# Patient Record
Sex: Female | Born: 1960 | Race: Black or African American | Hispanic: No | Marital: Married | Smoking: Never smoker
Health system: Southern US, Community
[De-identification: ages and names within clinical notes are randomized; demographics above are authoritative.]

## PROBLEM LIST (undated history)

## (undated) DIAGNOSIS — R0602 Shortness of breath: Secondary | ICD-10-CM

## (undated) DIAGNOSIS — K3189 Other diseases of stomach and duodenum: Secondary | ICD-10-CM

## (undated) DIAGNOSIS — M6740 Ganglion, unspecified site: Secondary | ICD-10-CM

## (undated) DIAGNOSIS — D649 Anemia, unspecified: Secondary | ICD-10-CM

## (undated) DIAGNOSIS — R1013 Epigastric pain: Secondary | ICD-10-CM

## (undated) DIAGNOSIS — I10 Essential (primary) hypertension: Secondary | ICD-10-CM

## (undated) DIAGNOSIS — K219 Gastro-esophageal reflux disease without esophagitis: Secondary | ICD-10-CM

## (undated) DIAGNOSIS — I5030 Unspecified diastolic (congestive) heart failure: Secondary | ICD-10-CM

## (undated) DIAGNOSIS — R002 Palpitations: Secondary | ICD-10-CM

## (undated) DIAGNOSIS — I509 Heart failure, unspecified: Secondary | ICD-10-CM

## (undated) DIAGNOSIS — M549 Dorsalgia, unspecified: Secondary | ICD-10-CM

## (undated) DIAGNOSIS — R55 Syncope and collapse: Secondary | ICD-10-CM

## (undated) DIAGNOSIS — M2550 Pain in unspecified joint: Secondary | ICD-10-CM

## (undated) DIAGNOSIS — R931 Abnormal findings on diagnostic imaging of heart and coronary circulation: Secondary | ICD-10-CM

## (undated) DIAGNOSIS — E559 Vitamin D deficiency, unspecified: Secondary | ICD-10-CM

## (undated) DIAGNOSIS — I421 Obstructive hypertrophic cardiomyopathy: Secondary | ICD-10-CM

## (undated) DIAGNOSIS — R011 Cardiac murmur, unspecified: Secondary | ICD-10-CM

## (undated) DIAGNOSIS — E785 Hyperlipidemia, unspecified: Secondary | ICD-10-CM

## (undated) DIAGNOSIS — G5690 Unspecified mononeuropathy of unspecified upper limb: Secondary | ICD-10-CM

## (undated) HISTORY — DX: Unspecified diastolic (congestive) heart failure: I50.30

## (undated) HISTORY — DX: Dorsalgia, unspecified: M54.9

## (undated) HISTORY — DX: Syncope and collapse: R55

## (undated) HISTORY — DX: Unspecified mononeuropathy of unspecified upper limb: G56.90

## (undated) HISTORY — DX: Essential (primary) hypertension: I10

## (undated) HISTORY — DX: Gastro-esophageal reflux disease without esophagitis: K21.9

## (undated) HISTORY — DX: Abnormal findings on diagnostic imaging of heart and coronary circulation: R93.1

## (undated) HISTORY — DX: Anemia, unspecified: D64.9

## (undated) HISTORY — PX: CARDIAC CATHETERIZATION: SHX172

## (undated) HISTORY — DX: Ganglion, unspecified site: M67.40

## (undated) HISTORY — DX: Shortness of breath: R06.02

## (undated) HISTORY — DX: Other diseases of stomach and duodenum: K31.89

## (undated) HISTORY — DX: Pain in unspecified joint: M25.50

## (undated) HISTORY — DX: Heart failure, unspecified: I50.9

## (undated) HISTORY — PX: TUBAL LIGATION: SHX77

## (undated) HISTORY — DX: Obstructive hypertrophic cardiomyopathy: I42.1

## (undated) HISTORY — DX: Palpitations: R00.2

## (undated) HISTORY — DX: Cardiac murmur, unspecified: R01.1

## (undated) HISTORY — DX: Hyperlipidemia, unspecified: E78.5

## (undated) HISTORY — DX: Epigastric pain: R10.13

## (undated) HISTORY — DX: Vitamin D deficiency, unspecified: E55.9

---

## 1997-09-14 ENCOUNTER — Encounter: Admission: RE | Admit: 1997-09-14 | Discharge: 1997-09-14 | Payer: Self-pay | Admitting: Family Medicine

## 1997-11-16 ENCOUNTER — Ambulatory Visit (HOSPITAL_COMMUNITY): Admission: RE | Admit: 1997-11-16 | Discharge: 1997-11-16 | Payer: Self-pay | Admitting: Obstetrics and Gynecology

## 1998-01-04 ENCOUNTER — Encounter: Admission: RE | Admit: 1998-01-04 | Discharge: 1998-01-04 | Payer: Self-pay | Admitting: Family Medicine

## 1998-01-19 ENCOUNTER — Encounter: Admission: RE | Admit: 1998-01-19 | Discharge: 1998-01-19 | Payer: Self-pay | Admitting: Sports Medicine

## 2002-04-13 ENCOUNTER — Inpatient Hospital Stay (HOSPITAL_COMMUNITY): Admission: EM | Admit: 2002-04-13 | Discharge: 2002-04-15 | Payer: Self-pay | Admitting: *Deleted

## 2002-04-13 ENCOUNTER — Encounter: Payer: Self-pay | Admitting: Cardiology

## 2002-04-13 ENCOUNTER — Encounter: Payer: Self-pay | Admitting: *Deleted

## 2002-04-14 ENCOUNTER — Encounter: Payer: Self-pay | Admitting: Family Medicine

## 2002-04-14 ENCOUNTER — Encounter (INDEPENDENT_AMBULATORY_CARE_PROVIDER_SITE_OTHER): Payer: Self-pay | Admitting: Interventional Cardiology

## 2003-05-14 ENCOUNTER — Emergency Department (HOSPITAL_COMMUNITY): Admission: EM | Admit: 2003-05-14 | Discharge: 2003-05-14 | Payer: Self-pay | Admitting: Emergency Medicine

## 2003-07-01 ENCOUNTER — Emergency Department (HOSPITAL_COMMUNITY): Admission: EM | Admit: 2003-07-01 | Discharge: 2003-07-02 | Payer: Self-pay | Admitting: Emergency Medicine

## 2003-08-06 ENCOUNTER — Encounter: Admission: RE | Admit: 2003-08-06 | Discharge: 2003-08-06 | Payer: Self-pay | Admitting: Family Medicine

## 2004-03-14 ENCOUNTER — Emergency Department (HOSPITAL_COMMUNITY): Admission: EM | Admit: 2004-03-14 | Discharge: 2004-03-14 | Payer: Self-pay | Admitting: Family Medicine

## 2004-12-05 ENCOUNTER — Emergency Department (HOSPITAL_COMMUNITY): Admission: EM | Admit: 2004-12-05 | Discharge: 2004-12-06 | Payer: Self-pay | Admitting: Family Medicine

## 2004-12-19 ENCOUNTER — Ambulatory Visit: Payer: Self-pay | Admitting: Family Medicine

## 2005-01-04 ENCOUNTER — Ambulatory Visit: Payer: Self-pay | Admitting: Family Medicine

## 2005-05-02 ENCOUNTER — Observation Stay (HOSPITAL_COMMUNITY): Admission: AD | Admit: 2005-05-02 | Discharge: 2005-05-03 | Payer: Self-pay | Admitting: Family Medicine

## 2005-05-02 ENCOUNTER — Ambulatory Visit: Payer: Self-pay | Admitting: Sports Medicine

## 2005-05-02 ENCOUNTER — Ambulatory Visit: Payer: Self-pay | Admitting: Family Medicine

## 2005-11-12 ENCOUNTER — Emergency Department (HOSPITAL_COMMUNITY): Admission: EM | Admit: 2005-11-12 | Discharge: 2005-11-12 | Payer: Self-pay | Admitting: Emergency Medicine

## 2005-11-16 ENCOUNTER — Ambulatory Visit: Payer: Self-pay | Admitting: Sports Medicine

## 2006-05-01 ENCOUNTER — Encounter (INDEPENDENT_AMBULATORY_CARE_PROVIDER_SITE_OTHER): Payer: Self-pay | Admitting: *Deleted

## 2006-05-01 LAB — CONVERTED CEMR LAB

## 2006-05-23 ENCOUNTER — Encounter (INDEPENDENT_AMBULATORY_CARE_PROVIDER_SITE_OTHER): Payer: Self-pay | Admitting: *Deleted

## 2006-05-23 ENCOUNTER — Ambulatory Visit: Payer: Self-pay | Admitting: Family Medicine

## 2006-05-30 ENCOUNTER — Ambulatory Visit: Payer: Self-pay | Admitting: Family Medicine

## 2006-05-30 ENCOUNTER — Encounter (INDEPENDENT_AMBULATORY_CARE_PROVIDER_SITE_OTHER): Payer: Self-pay | Admitting: *Deleted

## 2006-05-30 LAB — CONVERTED CEMR LAB
ALT: 16 units/L (ref 0–35)
AST: 14 units/L (ref 0–37)
Chloride: 102 meq/L (ref 96–112)
HDL: 38 mg/dL — ABNORMAL LOW (ref >39)
LDL Cholesterol: 69 mg/dL (ref 0–99)
Sodium: 137 meq/L (ref 135–145)
TSH: 0.556 microintl units/mL (ref 0.350–5.50)
Total Bilirubin: 0.6 mg/dL (ref 0.3–1.2)
Total CHOL/HDL Ratio: 3.3
VLDL: 18 mg/dL (ref 0–40)

## 2006-06-21 DIAGNOSIS — N920 Excessive and frequent menstruation with regular cycle: Secondary | ICD-10-CM

## 2006-06-21 DIAGNOSIS — I10 Essential (primary) hypertension: Secondary | ICD-10-CM | POA: Insufficient documentation

## 2006-06-21 DIAGNOSIS — IMO0002 Reserved for concepts with insufficient information to code with codable children: Secondary | ICD-10-CM

## 2006-06-21 DIAGNOSIS — F418 Other specified anxiety disorders: Secondary | ICD-10-CM | POA: Insufficient documentation

## 2006-06-21 DIAGNOSIS — F411 Generalized anxiety disorder: Secondary | ICD-10-CM

## 2006-06-21 HISTORY — DX: Generalized anxiety disorder: F41.1

## 2006-06-22 ENCOUNTER — Encounter (INDEPENDENT_AMBULATORY_CARE_PROVIDER_SITE_OTHER): Payer: Self-pay | Admitting: *Deleted

## 2006-08-15 ENCOUNTER — Encounter (INDEPENDENT_AMBULATORY_CARE_PROVIDER_SITE_OTHER): Payer: Self-pay | Admitting: *Deleted

## 2006-08-21 ENCOUNTER — Emergency Department (HOSPITAL_COMMUNITY): Admission: EM | Admit: 2006-08-21 | Discharge: 2006-08-21 | Payer: Self-pay | Admitting: Emergency Medicine

## 2006-08-24 ENCOUNTER — Encounter (INDEPENDENT_AMBULATORY_CARE_PROVIDER_SITE_OTHER): Payer: Self-pay | Admitting: *Deleted

## 2006-09-20 ENCOUNTER — Encounter (INDEPENDENT_AMBULATORY_CARE_PROVIDER_SITE_OTHER): Payer: Self-pay | Admitting: *Deleted

## 2006-12-07 ENCOUNTER — Telehealth: Payer: Self-pay | Admitting: *Deleted

## 2007-01-01 ENCOUNTER — Emergency Department (HOSPITAL_COMMUNITY): Admission: EM | Admit: 2007-01-01 | Discharge: 2007-01-01 | Payer: Self-pay | Admitting: Family Medicine

## 2007-01-17 ENCOUNTER — Ambulatory Visit: Payer: Self-pay | Admitting: Sports Medicine

## 2007-02-08 ENCOUNTER — Telehealth (INDEPENDENT_AMBULATORY_CARE_PROVIDER_SITE_OTHER): Payer: Self-pay | Admitting: *Deleted

## 2007-02-08 ENCOUNTER — Ambulatory Visit: Payer: Self-pay | Admitting: Internal Medicine

## 2007-03-12 ENCOUNTER — Emergency Department (HOSPITAL_COMMUNITY): Admission: EM | Admit: 2007-03-12 | Discharge: 2007-03-12 | Payer: Self-pay | Admitting: Family Medicine

## 2007-05-21 ENCOUNTER — Emergency Department (HOSPITAL_COMMUNITY): Admission: EM | Admit: 2007-05-21 | Discharge: 2007-05-21 | Payer: Self-pay | Admitting: Emergency Medicine

## 2007-07-18 ENCOUNTER — Encounter: Admission: RE | Admit: 2007-07-18 | Discharge: 2007-07-18 | Payer: Self-pay | Admitting: Gastroenterology

## 2007-11-08 ENCOUNTER — Inpatient Hospital Stay (HOSPITAL_COMMUNITY): Admission: EM | Admit: 2007-11-08 | Discharge: 2007-11-12 | Payer: Self-pay | Admitting: Emergency Medicine

## 2007-11-08 ENCOUNTER — Ambulatory Visit: Payer: Self-pay | Admitting: Family Medicine

## 2007-11-08 ENCOUNTER — Encounter: Payer: Self-pay | Admitting: Family Medicine

## 2007-11-08 LAB — CONVERTED CEMR LAB: HDL: 42 mg/dL

## 2007-11-10 ENCOUNTER — Encounter (INDEPENDENT_AMBULATORY_CARE_PROVIDER_SITE_OTHER): Payer: Self-pay | Admitting: Interventional Cardiology

## 2007-11-10 ENCOUNTER — Encounter: Payer: Self-pay | Admitting: Cardiology

## 2007-11-11 LAB — CONVERTED CEMR LAB: Creatinine, Ser: 0.93 mg/dL

## 2007-11-12 ENCOUNTER — Encounter: Payer: Self-pay | Admitting: Cardiology

## 2007-11-21 ENCOUNTER — Ambulatory Visit: Payer: Self-pay | Admitting: Family Medicine

## 2008-03-04 ENCOUNTER — Ambulatory Visit: Payer: Self-pay | Admitting: Family Medicine

## 2008-11-26 ENCOUNTER — Ambulatory Visit (HOSPITAL_COMMUNITY): Admission: RE | Admit: 2008-11-26 | Discharge: 2008-11-26 | Payer: Self-pay | Admitting: Family Medicine

## 2008-11-26 ENCOUNTER — Ambulatory Visit: Payer: Self-pay | Admitting: Family Medicine

## 2008-11-26 DIAGNOSIS — R51 Headache: Secondary | ICD-10-CM

## 2008-11-26 DIAGNOSIS — K047 Periapical abscess without sinus: Secondary | ICD-10-CM

## 2008-11-26 DIAGNOSIS — R519 Headache, unspecified: Secondary | ICD-10-CM | POA: Insufficient documentation

## 2008-11-27 ENCOUNTER — Telehealth (INDEPENDENT_AMBULATORY_CARE_PROVIDER_SITE_OTHER): Payer: Self-pay | Admitting: *Deleted

## 2008-12-15 ENCOUNTER — Ambulatory Visit: Payer: Self-pay | Admitting: Family Medicine

## 2008-12-15 ENCOUNTER — Encounter: Payer: Self-pay | Admitting: Family Medicine

## 2008-12-15 LAB — CONVERTED CEMR LAB
ALT: 17 units/L (ref 0–35)
AST: 17 units/L (ref 0–37)
Albumin: 4.2 g/dL (ref 3.5–5.2)
CO2: 22 meq/L (ref 19–32)
Creatinine, Ser: 0.97 mg/dL (ref 0.40–1.20)

## 2008-12-17 ENCOUNTER — Ambulatory Visit: Payer: Self-pay | Admitting: Family Medicine

## 2009-06-22 ENCOUNTER — Encounter: Admission: RE | Admit: 2009-06-22 | Discharge: 2009-06-22 | Payer: Self-pay | Admitting: Obstetrics and Gynecology

## 2009-07-01 ENCOUNTER — Encounter: Admission: RE | Admit: 2009-07-01 | Discharge: 2009-07-01 | Payer: Self-pay | Admitting: Obstetrics and Gynecology

## 2009-09-08 ENCOUNTER — Ambulatory Visit: Payer: Self-pay | Admitting: Family Medicine

## 2009-09-08 ENCOUNTER — Encounter: Payer: Self-pay | Admitting: Cardiology

## 2009-09-08 ENCOUNTER — Ambulatory Visit (HOSPITAL_COMMUNITY): Admission: RE | Admit: 2009-09-08 | Discharge: 2009-09-08 | Payer: Self-pay | Admitting: Family Medicine

## 2009-09-08 DIAGNOSIS — R52 Pain, unspecified: Secondary | ICD-10-CM | POA: Insufficient documentation

## 2009-09-08 DIAGNOSIS — R079 Chest pain, unspecified: Secondary | ICD-10-CM | POA: Insufficient documentation

## 2009-09-15 ENCOUNTER — Encounter: Payer: Self-pay | Admitting: Family Medicine

## 2009-09-15 ENCOUNTER — Telehealth: Payer: Self-pay | Admitting: *Deleted

## 2009-12-24 ENCOUNTER — Encounter: Payer: Self-pay | Admitting: Family Medicine

## 2009-12-24 ENCOUNTER — Ambulatory Visit: Payer: Self-pay | Admitting: Family Medicine

## 2009-12-24 DIAGNOSIS — R5383 Other fatigue: Secondary | ICD-10-CM | POA: Insufficient documentation

## 2009-12-24 DIAGNOSIS — R209 Unspecified disturbances of skin sensation: Secondary | ICD-10-CM | POA: Insufficient documentation

## 2009-12-24 DIAGNOSIS — R5381 Other malaise: Secondary | ICD-10-CM

## 2009-12-28 ENCOUNTER — Encounter: Payer: Self-pay | Admitting: Family Medicine

## 2009-12-28 DIAGNOSIS — E559 Vitamin D deficiency, unspecified: Secondary | ICD-10-CM

## 2009-12-28 LAB — CONVERTED CEMR LAB
ALT: 15 units/L (ref 0–35)
Chloride: 104 meq/L (ref 96–112)
Cholesterol: 160 mg/dL (ref 0–200)
Creatinine, Ser: 1.04 mg/dL (ref 0.40–1.20)
HCT: 41.1 % (ref 36.0–46.0)
Hemoglobin: 13.4 g/dL (ref 12.0–15.0)
MCV: 82.7 fL (ref 78.0–100.0)
Potassium: 4.4 meq/L (ref 3.5–5.3)
RBC: 4.97 M/uL (ref 3.87–5.11)
RDW: 15.2 % (ref 11.5–15.5)
TSH: 0.502 microintl units/mL (ref 0.350–4.500)
Total Bilirubin: 0.5 mg/dL (ref 0.3–1.2)
Total CHOL/HDL Ratio: 3.9
VLDL: 25 mg/dL (ref 0–40)
Vitamin B-12: 928 pg/mL — ABNORMAL HIGH (ref 211–911)

## 2010-01-28 ENCOUNTER — Ambulatory Visit: Payer: Self-pay | Admitting: Cardiology

## 2010-01-28 DIAGNOSIS — R55 Syncope and collapse: Secondary | ICD-10-CM

## 2010-01-28 DIAGNOSIS — I421 Obstructive hypertrophic cardiomyopathy: Secondary | ICD-10-CM | POA: Insufficient documentation

## 2010-01-28 HISTORY — DX: Syncope and collapse: R55

## 2010-02-16 ENCOUNTER — Ambulatory Visit: Payer: Self-pay | Admitting: Cardiovascular Disease

## 2010-02-16 ENCOUNTER — Ambulatory Visit: Payer: Self-pay | Admitting: Cardiology

## 2010-02-16 ENCOUNTER — Ambulatory Visit: Payer: Self-pay | Admitting: Internal Medicine

## 2010-02-16 ENCOUNTER — Ambulatory Visit: Payer: Self-pay

## 2010-02-16 ENCOUNTER — Encounter: Payer: Self-pay | Admitting: Cardiology

## 2010-02-16 ENCOUNTER — Ambulatory Visit (HOSPITAL_COMMUNITY): Admission: RE | Admit: 2010-02-16 | Discharge: 2010-02-16 | Payer: Self-pay | Admitting: Cardiology

## 2010-03-08 ENCOUNTER — Ambulatory Visit: Payer: Self-pay | Admitting: Cardiology

## 2010-05-24 NOTE — Letter (Signed)
Summary: NM Myo Perfus Eject Fract   NM Myo Perfus Eject Fract   Imported By: Marylou Mccoy 02/11/2010 08:15:57  _____________________________________________________________________  External Attachment:    Type:   Image     Comment:   External Document

## 2010-05-24 NOTE — Letter (Signed)
Summary: Cardiac Cath  Cardiac Cath   Imported By: Marylou Mccoy 02/11/2010 08:24:28  _____________________________________________________________________  External Attachment:    Type:   Image     Comment:   External Document

## 2010-05-24 NOTE — Assessment & Plan Note (Signed)
Summary: chest pain,tcb   Vital Signs:  Patient profile:   50 year old female Weight:      184 pounds Temp:     98.4 degrees F oral Pulse rate:   77 / minute BP sitting:   121 / 81  (left arm) Cuff size:   large  Vitals Entered By: Jimmy Footman, CMA (December 24, 2009 9:59 AM) CC: joint pain and shoulder pain (chronic) Is Patient Diabetic? No Pain Assessment Patient in pain? yes     Location: joint Intensity: 10 Type: aching Comments Chest pain on and off Numbness in left left legs, hands, and lips    Primary Care Provider:  Delbert Harness MD  CC:  joint pain and shoulder pain (chronic).  History of Present Illness: 50 yo  Chronic intermittant chest pain:  Had a severe episode of chest pain on Aug 17th which she almost went to the ER for,  Was midsternal described as "spasms" non radiating and has been using aspirin frequently for this pain whic releiives it.  She connect this with her menstrual cycle but it also happens off, less severely. Occurs 1-2 per week lasting 15-20 minutes.  Usually occurs in evening.  Can be at rest or "sometime after I exercise" but no pain while exercising.  Has been having this pattern of chest pain for years now.  Hx of normal cath in 2009.    Patient is planning on having hysterectomy for heavy menstrual bleeding.  Lips and cheek, face tingling, numbness.   No swelling, erythema.    Habits & Providers  Alcohol-Tobacco-Diet     Tobacco Status: never  Current Medications (verified): 1)  Lisinopril-Hydrochlorothiazide 20-25 Mg  Tabs (Lisinopril-Hydrochlorothiazide) .... One Tab Daily. 2)  Aspirin 81 Mg  Tbec (Aspirin) .... One By Mouth Every Day  Allergies: 1)  Codeine Phosphate (Codeine Phosphate)  Past History:  Past Medical History: Left leg mole HTN Atypical Chest pain with Neg Cardiac Cath in July 2009 ECHO shows apical hypertrophy PMH-FH-SH reviewed-no changes except otherwise noted  Review of Systems      See  HPI  Physical Exam  General:  Well-developed,well-nourished,in no acute distress; alert,appropriate and cooperative throughout examination Neck:  supple, full ROM, and no masses.  No JVD Lungs:  Normal respiratory effort, chest expands symmetrically. Lungs are clear to auscultation, no crackles or wheezes. Heart:  Normal rate and regular rhythm. S1 and S2 normal without gallop, murmur, click, rub or other extra sounds. Extremities:  no LE edema Psych:  Oriented X3 and memory intact for recent and remote.     Impression & Recommendations:  Problem # 1:  CHEST PAIN (ICD-786.50)  Patient with continued recurrent chest pain thought to be non-cardiac after 2009 admission for chest pain. Non-obstructive cath, sever apical hypertophy. Today does not seem muskuloskeletal, GI related.  Patient denies anxiety.  Given patient continued concern, planning surgery, will refer to Cards for evaluation and consultation with patient.    Orders: [facility]- Est Level  3 (40102) Cardiology Referral (Cardiology)  Problem # 2:  PARESTHESIA (ICD-782.0) Findings not consistant with neuropathy or neurological districbution.  WIll check labs.  Follow up at next visit. Orders: Vit D, 25 [facility] [id number]) B12-[facility] [phone]) [facility]- Est Level  3 ([id number])  Complete Medication List: 1)  Lisinopril-hydrochlorothiazide 20-25 Mg Tabs (Lisinopril-hydrochlorothiazide) .... One tab daily. 2)  Aspirin 81 Mg Tbec (Aspirin) .... One by mouth every day  Other Orders: Comp Met-[facility] [phone]) Lipid-[facility] [phone]) CBC-[facility] ([id number]) TSH-[facility] [phone])  Patient Instructions:  1)  Will set up cardiology appt for you- let us know if you do not hear in the next week. 2)  Will wend you a letter if your labwork is normal, otherwise will call. 3)  Follow-up in 3-4 months or sooner if needed.

## 2010-05-24 NOTE — Letter (Signed)
Summary: Results Follow-up Letter  [facility]  [address]   [place], [place] [address]   Phone: [phone]  Fax: [phone]    12/28/2009  [address] [place], [place]  [address]  Dear Ms. Abood,   The following are the results of your recent test(s):  Your bloodwork all looked normal except for a low Vitamin D level.  This is not dangerous, but we will work on getting this up to a normal level.   I have sent in a prescriptino to your [facility].  We can recheck your level after you have completed your prescription.  Please let us know if you have further questions.  Sincerely,  Delbert Harness MD [facility]

## 2010-05-24 NOTE — Assessment & Plan Note (Signed)
Summary: per check out/sf   Primary Provider:  Delbert Harness MD   History of Present Illness: 50 year old female I saw in Oct 2011 for evaluation of chest pain. Apparently had a normal cardiac catheterization in 2009 other than apical hypertrophy. One I saw her previously we scheduled an echocardiogram which was performed in October of 2011. This revealed normal LV function and apical hypertrophy. There was moderate left atrial enlargement and mild mitral regurgitation. A Holter monitor showed no nonsustained ventricular tachycardia. An exercise treadmill revealed no decrease in systolic blood pressure with exercise. Since I last saw her, she has mild dyspnea on exertion relieved with rest. There is no orthopnea, PND, pedal edema, palpitations, syncope. She occasionally feels a brief twinge of pain under her left breast but no exertional chest pain.  Current Medications (verified): 1)  Lisinopril-Hydrochlorothiazide 20-25 Mg  Tabs (Lisinopril-Hydrochlorothiazide) .... One Tab Daily. 2)  Aspirin 81 Mg  Tbec (Aspirin) .... One By Mouth Every Day 3)  Metoprolol Succinate 25 Mg Xr24h-Tab (Metoprolol Succinate) .... Take One Tablet By Mouth Daily  Allergies (verified): 1)  Codeine Phosphate (Codeine Phosphate)  Past History:  Past Medical History: HTN hypertrophic cardiomyopathy (apical hypertrophy)  Past Surgical History: Reviewed history from 01/28/2010 and no changes required. Cervical conization (sev. Dysplasia) - 04/24/1997 Tubal ligation - 04/25/1987  Social History: Reviewed history from 12/24/2009 and no changes required. Married (1st husband died from massive MI), has 5 children(+ 4 from husband's prior marriage), works as Librarian, academic; Exercises routinely at [employer]; Denies tobacco or EtOH  Review of Systems       no fevers or chills, productive cough, hemoptysis, dysphasia, odynophagia, melena, hematochezia, dysuria, hematuria, rash, seizure activity, orthopnea, PND, pedal edema,  claudication. Remaining systems are negative.   Vital Signs:  Patient profile:   50 year old female Weight:      186 pounds Pulse rate:   60 / minute Pulse rhythm:   regular BP sitting:   108 / 60  (left arm) Cuff size:   regular  Vitals Entered By: Deliah Goody, RN (March 08, 2010 12:03 PM)  Physical Exam  General:  Well-developed well-nourished in no acute distress.  Skin is warm and dry.  HEENT is normal.  Neck is supple. No thyromegaly.  Chest is clear to auscultation with normal expansion.  Cardiovascular exam is regular rate and rhythm.  Abdominal exam nontender or distended. No masses palpated. Extremities show no edema. neuro grossly intact    Impression & Recommendations:  Problem # 1:  HOCM (ICD-425.1) Patient has apical hypertrophic cardiomyopathy. She has no risk factors for sudden cardiac death. We will continue with her beta blocker. I have instructed her that her children and siblings need to be screened. Her updated medication list for this problem includes:    Lisinopril-hydrochlorothiazide 20-25 Mg Tabs (Lisinopril-hydrochlorothiazide) ..... One tab daily.    Aspirin 81 Mg Tbec (Aspirin) ..... One by mouth every day    Metoprolol Succinate 25 Mg Xr24h-tab (Metoprolol succinate) .Marland Kitchen... Take one tablet by mouth daily  Problem # 2:  CHEST PAIN (ICD-786.50) Appears to be a chronic issue. No further workup at this time. Her updated medication list for this problem includes:    Lisinopril-hydrochlorothiazide 20-25 Mg Tabs (Lisinopril-hydrochlorothiazide) ..... One tab daily.    Aspirin 81 Mg Tbec (Aspirin) ..... One by mouth every day    Metoprolol Succinate 25 Mg Xr24h-tab (Metoprolol succinate) .Marland Kitchen... Take one tablet by mouth daily  Problem # 3:  HYPERTENSION, BENIGN SYSTEMIC (ICD-401.1) Blood pressure  controlled. Continue present medications. Her updated medication list for this problem includes:    Lisinopril-hydrochlorothiazide 20-25 Mg Tabs  (Lisinopril-hydrochlorothiazide) ..... One tab daily.    Aspirin 81 Mg Tbec (Aspirin) ..... One by mouth every day    Metoprolol Succinate 25 Mg Xr24h-tab (Metoprolol succinate) .Marland Kitchen... Take one tablet by mouth daily  Her updated medication list for this problem includes:    Lisinopril-hydrochlorothiazide 20-25 Mg Tabs (Lisinopril-hydrochlorothiazide) ..... One tab daily.    Aspirin 81 Mg Tbec (Aspirin) ..... One by mouth every day    Metoprolol Succinate 25 Mg Xr24h-tab (Metoprolol succinate) .Marland Kitchen... Take one tablet by mouth daily  Patient Instructions: 1)  Your physician wants you to follow-up in: 6 MONTHS  You will receive a reminder letter in the mail two months in advance. If you don't receive a letter, please call our office to schedule the follow-up appointment.

## 2010-05-24 NOTE — Assessment & Plan Note (Signed)
Summary: np6/chronic intermt. chest pain/lkg   Primary Provider:  Delbert Harness MD  CC:  chest pain.  History of Present Illness: 50 year old female for evaluation of chest pain. Apparently had a normal cardiac catheterization in 2009 other than apical hypertrophy. Echocardiogram in 2009 showed an ejection fraction of 75-80% with mild to moderate LVH and apical hypertrophy. 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 There is no significant valvular abnormality. The patient apparently has had intermittent chest pain since 2003. The pain is in the left chest area. It is described as a heaviness. It lasts for hours at a time. It increases during menstrual cycles. It can increase with lying flat. Is not exertional, pleuritic or related to food. There's no associated symptoms. She takes aspirin for the pain. She does have mild dyspnea on exertion but no orthopnea, PND, pedal edema. She has had 2 syncopal episodes in the past. Both occurred after lifting weights. She suddenly felt "hot", clammy and mild nausea. Her last was 2 years ago.  Current Medications (verified): 1)  Lisinopril-Hydrochlorothiazide 20-25 Mg  Tabs (Lisinopril-Hydrochlorothiazide) .... One Tab Daily. 2)  Aspirin 81 Mg  Tbec (Aspirin) .... One By Mouth Every Day 3)  Ergocalciferol 50000 Unit Caps (Ergocalciferol) .... Take One Tablet A Week For 8 Weeks  Allergies: 1)  Codeine Phosphate (Codeine Phosphate)  Past History:  Past Medical History: HTN Apical hypertrophy  Past Surgical History: Cervical conization (sev. Dysplasia) - 04/24/1997 Tubal ligation - 04/25/1987  Family History: Reviewed history from 12/24/2009 and no changes required. F- died from cirrhosis, M- healthy, no DM or CAD or breast CA No premature CAD  Social History: Reviewed history from 12/24/2009 and no changes required. 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 Remaining systems are negative.   Vital Signs:  Patient profile:   50 year old female Height:      65 inches Weight:      181 pounds BMI:     30.23 Pulse rate:   73 / minute Resp:     14 per minute BP sitting:   140 / 77  (left arm)  Vitals Entered By: Kem Parkinson (January 28, 2010 11:11 AM)  Physical Exam  General:  Well developed/well nourished in NAD Skin warm/dry Patient not depressed No peripheral clubbing Back-normal HEENT-normal/normal eyelids Neck supple/normal carotid upstroke bilaterally; no bruits; no JVD; no thyromegaly chest - CTA/ normal expansion CV - RRR/normal S1 and S2; no  rubs or gallops;  PMI nondisplaced; 2/6 systolic murmur left sternal border that increases with Valsalva. Abdomen -NT/ND, no HSM, no mass, + bowel sounds, no bruit 2+ femoral pulses, no bruits Ext-no edema, chords, 2+ DP Neuro-grossly nonfocal      EKG  Procedure date:  01/28/2010  Findings:      Sinus rhythm at a rate of 73. Axis normal. Diffuse T-wave inversion. Left atrial enlargement.  Impression & Recommendations:  Problem # 1:  CHEST PAIN (ICD-786.50) Symptoms atypical. No further ischemia evaluation warranted. Her updated medication list for this problem includes:    Lisinopril-hydrochlorothiazide 20-25 Mg Tabs (Lisinopril-hydrochlorothiazide) ..... One tab daily.    Aspirin 81 Mg Tbec (Aspirin) .Marland KitchenMarland KitchenMarland KitchenMarland Kitchen  One by mouth every day    Metoprolol Succinate 25 Mg Xr24h-tab (Metoprolol succinate) .Marland Kitchen... Take one tablet by mouth daily  Orders: Treadmill (Treadmill)  Problem # 2:  HOCM (ICD-425.1) Patient's electrocardiogram is consistent with hypertrophic cardiomyopathy.  Her previous echocardiogram and cardiac catheterization described apical hypertrophy. I will repeat her echocardiogram. I will schedule a 48 hour Holter monitor to exclude nonsustained ventricular tachycardia. I will schedule an exercise treadmill to make sure that her systolic blood pressure does not fall with exercise. She has no family history of sudden death. She did have syncope previously that sounds vagally mediated. I will Toprol 25 mg p.o. daily. I will see her back in 6 weeks. If hypertrophic cardiomyopathy is demonstrated on her echocardiogram then she will need her siblings and children screened. She may require EP evaluation in the future if significant risks for sudden death demonstrated on above studies. Her updated medication list for this problem includes:    Lisinopril-hydrochlorothiazide 20-25 Mg Tabs (Lisinopril-hydrochlorothiazide) ..... One tab daily.    Aspirin 81 Mg Tbec (Aspirin) ..... One by mouth every day    Metoprolol Succinate 25 Mg Xr24h-tab (Metoprolol succinate) .Marland Kitchen... Take one tablet by mouth daily  Problem # 3:  SYNCOPE (ICD-780.2) Previous episodes sound vaguely mediated. Her updated medication list for this problem includes:    Lisinopril-hydrochlorothiazide 20-25 Mg Tabs (Lisinopril-hydrochlorothiazide) ..... One tab daily.    Aspirin 81 Mg Tbec (Aspirin) ..... One by mouth every day    Metoprolol Succinate 25 Mg Xr24h-tab (Metoprolol succinate) .Marland Kitchen... Take one tablet by mouth daily  Orders: Echocardiogram (Echo) Holter (Holter)  Problem # 4:  HYPERTENSION, BENIGN SYSTEMIC (ICD-401.1) Add Toprol as described above. Her updated medication list for this problem includes:    Lisinopril-hydrochlorothiazide 20-25 Mg Tabs (Lisinopril-hydrochlorothiazide) ..... One tab daily.    Aspirin 81 Mg Tbec (Aspirin) ..... One by mouth every day    Metoprolol Succinate 25 Mg Xr24h-tab (Metoprolol succinate) .Marland Kitchen... Take one tablet by mouth daily  Patient Instructions: 1)   Your physician recommends that you schedule a follow-up appointment in: 6 WEEKS 2)  Your physician has recommended you make the following change in your medication: START METOPROLOL SUCC 25MG  ONCE DAILY 3)  Your physician has requested that you have an echocardiogram.  Echocardiography is a painless test that uses sound waves to create images of your heart. It provides your doctor with information about the size and shape of your heart and how well your heart's chambers and valves are working.  This procedure takes approximately one hour. There are no restrictions for this procedure. 4)  Your physician has recommended that you wear a holter monitor.  Holter monitors are medical devices that record the heart's electrical activity. Doctors most often use these monitors to diagnose arrhythmias. Arrhythmias are problems with the speed or rhythm of the heartbeat. The monitor is a small, portable device. You can wear one while you do your normal daily activities. This is usually used to diagnose what is causing palpitations/syncope (passing out). 48 HOUR 5)  Your physician has requested that you have an exercise tolerance test.  For further information please visit [email or web address]  Please also follow instruction sheet, as given. Prescriptions: METOPROLOL SUCCINATE 25 MG XR24H-TAB (METOPROLOL SUCCINATE) Take one tablet by mouth daily  #30 x 12   Entered by:   Deliah Goody, RN   Authorized by:   Ferman Hamming, MD, [facility]   Signed by:   Deliah Goody, RN on 01/28/2010   Method used:  [facility]ronically to        [phone][facility] (retail)       [address]       [place], [place]  [id number]       Ph: [phone] or [phone]       Fax: [phone]   RxID:   [id number]

## 2010-05-24 NOTE — Assessment & Plan Note (Signed)
Summary: not feeling well,df    Vital Signs:  Patient profile:   50 year old female Weight:      182 pounds Temp:     97.9 degrees F oral Pulse rate:   74 / minute BP sitting:   153 / 81  (left arm) Cuff size:   regular  Vitals Entered By: Tessie Fass CMA (Sep 08, 2009 10:32 AM) CC: not feeling well x 1 week Is Patient Diabetic? No Pain Assessment Patient in pain? no        Primary Care Provider:  Delbert Harness MD  CC:  not feeling well x 1 week.  History of Present Illness: 1.  not feeling well--past week.  under a lot of stress.  trouble sleeping.  back pain.  headache.  feeling fatigued. left leg pain at night.   left hand ocassionally goes numb.  one day had some chest pressure and shortness of breath that lasted for one minute and resolved spontaneously.  she was sitting at the time.  felt a little better when she stood up.    In 2009 was hospitalized for chest pain. had an abnormal echo with apical hypertrohphy.  had abnromal stress test and subsequently underwent a  cardiac cath, which was normal  all of these symptoms started a few days before her cycle.  has a history of physical and emotional symptoms around the time of her menses.  has a hx of anxiety as well.  had been on zoloft, but not currently taking.     Habits & Providers  Alcohol-Tobacco-Diet     Tobacco Status: never  Allergies: 1)  Codeine Phosphate (Codeine Phosphate)  Physical Exam  General:  Well-developed,well-nourished,in no acute distress; alert,appropriate and cooperative throughout examination Eyes:  normal appearance; eomi; perrl Lungs:  Normal respiratory effort, chest expands symmetrically. Lungs are clear to auscultation, no crackles or wheezes. Heart:  Normal rate and regular rhythm. S1 and S2 normal without gallop, murmur, click, rub or other extra sounds. Abdomen:  Bowel sounds positive,abdomen soft and non-tender without masses, organomegaly or hernias noted. Psych:  Oriented  X3, memory intact for recent and remote, normally interactive, good eye contact, not anxious appearing, and not depressed appearing.   Additional Exam:  vital signs reviewed    Impression & Recommendations:  Problem # 1:  Hx of CHEST PAIN (ICD-786.50) Assessment Unchanged EKG showed ST changes (inversion and depression in I and II, flattening in III, inversion in aVL, aVF, inversion and depression in V3-V6), but  EKG from the ER in July 2009 showed all of these changes except for the inversion in aVF.  Additionally, pain was very atypical and short-lived.   Orders: 12 Lead EKG (12 Lead EKG) [facility]- Est  Level 4 (45409)  Problem # 2:  GENERALIZED PAIN (ICD-780.96) Assessment: New  really she just has multiple somatic complaints.  pt tells me today that she thinks that much of this is likely due to anxiety.  perhaps there is a compenent of PMS as well.  her husband died of a massive MI at age 80; so this has contributed to her feeling anxious when she has physical symptoms.  Will follow up in 2 weeks.  if still with symptoms, check labs (TSH,CBC, CMET) and consider starting her back on an anxiety med if there is no better explanation for her complaints  Orders: [facility]- Est  Level 4 (81191)  Complete Medication List: 1)  Lisinopril-hydrochlorothiazide 20-25 Mg Tabs (Lisinopril-hydrochlorothiazide) .... One tab daily. 2)  Aspirin  81 Mg Tbec (Aspirin) .... One by mouth every day  Patient Instructions: 1)  It was nice to see you today. 2)  If you have crushing chest pain or shortness of breath, call 911. 3)  Please schedule a follow-up appointment in 2 weeks.

## 2010-05-24 NOTE — Progress Notes (Signed)
Summary: phn msg  Phone Note Call from Patient Call back at Work Phone [phone]   Caller: Patient Summary of Call: Would like to talk to Dr. Lafonda Mosses who she saw last about getting a referral to a cardilogist. Initial call taken by: Clydell Hakim,  Sep 15, 2009 8:46 AM  Follow-up for Phone Call        to PCP coverage Follow-up by: Gladstone Pih,  Sep 15, 2009 11:44 AM  Additional Follow-up for Phone Call Additional follow up Details #1::        I would like her to come in for a follow up appointment next week--in fact I advised her to schedule one at her last appt.  If we need to send her to cardiology at that time, we will.  thanks Additional Follow-up by: Asher Muir MD,  Sep 16, 2009 8:07 AM    Additional Follow-up for Phone Call Additional follow up Details #2::    pt will schedule an appt with pcp Follow-up by: Loralee Pacas CMA,  Sep 16, 2009 2:31 PM

## 2010-05-24 NOTE — Procedures (Signed)
Summary: summary report  summary report   Imported By: Mirna Mires 02/28/2010 08:46:53  _____________________________________________________________________  External Attachment:    Type:   Image     Comment:   External Document

## 2010-06-03 ENCOUNTER — Encounter: Payer: Self-pay | Admitting: *Deleted

## 2010-07-14 ENCOUNTER — Encounter: Payer: Self-pay | Admitting: Physician Assistant

## 2010-07-20 ENCOUNTER — Ambulatory Visit (INDEPENDENT_AMBULATORY_CARE_PROVIDER_SITE_OTHER): Payer: 59 | Admitting: Physician Assistant

## 2010-07-20 ENCOUNTER — Encounter: Payer: Self-pay | Admitting: Physician Assistant

## 2010-07-20 VITALS — BP 128/75 | HR 77 | Resp 12 | Ht 65.0 in | Wt 193.0 lb

## 2010-07-20 DIAGNOSIS — R079 Chest pain, unspecified: Secondary | ICD-10-CM

## 2010-07-20 DIAGNOSIS — I10 Essential (primary) hypertension: Secondary | ICD-10-CM

## 2010-07-20 DIAGNOSIS — R209 Unspecified disturbances of skin sensation: Secondary | ICD-10-CM

## 2010-07-20 DIAGNOSIS — I421 Obstructive hypertrophic cardiomyopathy: Secondary | ICD-10-CM

## 2010-07-20 DIAGNOSIS — R252 Cramp and spasm: Secondary | ICD-10-CM

## 2010-07-20 NOTE — Assessment & Plan Note (Signed)
Check cbc.  If macrocytic anemia, she can f/u with PCP for evaluation for B12 and folate.  I reassured her this was not from her medications.

## 2010-07-20 NOTE — Assessment & Plan Note (Signed)
Echo, ETT and holter all done within last 12 months.  Follow up with Dr. Jens Som in 3 months.

## 2010-07-20 NOTE — Patient Instructions (Signed)
Your physician recommends that you schedule a follow-up appointment in: 3 months with DR. CRENSHAW AS PER SCOTT WEAVER, PA-C  Your physician recommends that you return for lab work in: today bmet 401.1, 729.82, mag 401.1, 729.82, cbc 782.0

## 2010-07-20 NOTE — Assessment & Plan Note (Signed)
Chronic without change.  Normal cath in 2009.  I advised her that she can increase her activity as tolerated.

## 2010-07-20 NOTE — Progress Notes (Signed)
History of Present Illness: Primary Cardiologist: Dr. Olga Millers  [name] is a 50 y.o. female with a h/o normal cardiac catheterization in 2009 other than apical hypertrophy.  Dr. Jens Som had her do an echocardiogram which was performed in October of 2011. This revealed normal LV function and apical hypertrophy. There was moderate left atrial enlargement and mild mitral regurgitation. A Holter monitor showed no nonsustained ventricular tachycardia. An exercise treadmill revealed no decrease in systolic blood pressure with exercise.  She comes in today with questions about her medications.  She wants to know if they can cause tingling in her fingers.  This has been going on for a few months.  She thinks she took B12 at one point.  She also notes some leg cramps at night.  Feels like something crawling and has to move legs.  She has chest pain which is chronic without change.  She denies significant dyspnea.  No syncope.  No orthopnea or pnd.  No edema.  She wants to know if she can continue to increase exercise.  She notes some palps but these are unchanged.    Past Medical History  Diagnosis Date  . Chest pain     cath 7/09: normal cors; EF 65%  . HTN (hypertension)   . Hyperlipidemia   . HOCM (hypertrophic obstructive cardiomyopathy)     echo 10/11: normal LVF; apical hypertropy; mod LAE; mild MR;      Holter 10/11: no NSVT;     ETT 10/11: no decreased BP with exercise     Current Outpatient Prescriptions  Medication Sig Dispense Refill  . aspirin 81 MG EC tablet Take 81 mg by mouth daily.        Marland Kitchen lisinopril-hydrochlorothiazide (PRINZIDE,ZESTORETIC) 20-25 MG per tablet Take 1 tablet by mouth daily.        . metoprolol (TOPROL-XL) 25 MG 24 hr tablet Take 25 mg by mouth daily.          Allergies  Allergen Reactions  . Codeine Phosphate     REACTION: unspecified    Vital Signs: BP 128/75  Pulse 77  Resp 12  Ht 5\' 5"  (1.651 m)  Wt 193 lb (87.544 kg)  BMI 32.12  kg/m2  PHYSICAL EXAM: Well nourished, well developed, in no acute distress HEENT: normal Neck: no JVD Cardiac:  normal S1, S2; RRR; no murmur Lungs:  clear to auscultation bilaterally, no wheezing, rhonchi or rales Abd: soft, nontender, no hepatomegaly Ext: no edema Skin: warm and dry Neuro:  CNs 2-12 intact, no focal abnormalities noted  EKG:  NSR; HR 78; LVH with repolarization abnormality; no change since 01/2010  ASSESSMENT AND PLAN:

## 2010-07-20 NOTE — Assessment & Plan Note (Signed)
With ACE/diuretic, will check bmet and magnesium.  Otherwise, f/u with PCP.  May be RLS.  If hgb low, she will need iron eval.

## 2010-07-20 NOTE — Assessment & Plan Note (Signed)
Controlled.  Continue current therapy.  

## 2010-07-21 ENCOUNTER — Telehealth: Payer: Self-pay | Admitting: *Deleted

## 2010-07-21 LAB — CBC WITH DIFFERENTIAL/PLATELET
Basophils Absolute: 0.1 10*3/uL (ref 0.0–0.1)
Basophils Relative: 0.6 % (ref 0.0–3.0)
Eosinophils Absolute: 0.3 10*3/uL (ref 0.0–0.7)
Eosinophils Relative: 3.3 % (ref 0.0–5.0)
Hemoglobin: 12.6 g/dL (ref 12.0–15.0)
Lymphocytes Relative: 27.4 % (ref 12.0–46.0)
MCV: 82.4 fl (ref 78.0–100.0)
Neutro Abs: 5.8 10*3/uL (ref 1.4–7.7)
Neutrophils Relative %: 60.4 % (ref 43.0–77.0)
RDW: 14.9 % — ABNORMAL HIGH (ref 11.5–14.6)

## 2010-07-21 LAB — BASIC METABOLIC PANEL
Glucose, Bld: 85 mg/dL (ref 70–99)
Sodium: 139 mEq/L (ref 135–145)

## 2010-07-21 LAB — MAGNESIUM: Magnesium: 2 mg/dL (ref 1.5–2.5)

## 2010-07-21 NOTE — Telephone Encounter (Signed)
pt aware of lab results  

## 2010-07-21 NOTE — Telephone Encounter (Signed)
Message copied by Deliah Goody on Thu Jul 21, 2010  5:51 PM ------      Message from: Olga Millers      Created: Thu Jul 21, 2010 11:59 AM       ok

## 2010-07-21 NOTE — Telephone Encounter (Signed)
Message copied by Deliah Goody on Thu Jul 21, 2010  5:48 PM ------      Message from: Olga Millers      Created: Thu Jul 21, 2010 11:59 AM       ok

## 2010-07-28 ENCOUNTER — Other Ambulatory Visit: Payer: Self-pay | Admitting: Obstetrics and Gynecology

## 2010-07-28 DIAGNOSIS — Z1231 Encounter for screening mammogram for malignant neoplasm of breast: Secondary | ICD-10-CM

## 2010-08-03 ENCOUNTER — Ambulatory Visit
Admission: RE | Admit: 2010-08-03 | Discharge: 2010-08-03 | Disposition: A | Discharge status: Home / Self Care | Payer: 59 | Source: Ambulatory Visit | Attending: Obstetrics and Gynecology | Admitting: Obstetrics and Gynecology

## 2010-08-03 DIAGNOSIS — Z1231 Encounter for screening mammogram for malignant neoplasm of breast: Secondary | ICD-10-CM

## 2010-08-31 ENCOUNTER — Ambulatory Visit (INDEPENDENT_AMBULATORY_CARE_PROVIDER_SITE_OTHER): Payer: 59 | Admitting: Family Medicine

## 2010-08-31 ENCOUNTER — Encounter: Payer: Self-pay | Admitting: Family Medicine

## 2010-08-31 VITALS — BP 116/71 | HR 61 | Temp 98.2°F | Ht 65.0 in | Wt 189.2 lb

## 2010-08-31 DIAGNOSIS — E559 Vitamin D deficiency, unspecified: Secondary | ICD-10-CM

## 2010-08-31 DIAGNOSIS — R079 Chest pain, unspecified: Secondary | ICD-10-CM

## 2010-08-31 DIAGNOSIS — Z0000 Encounter for general adult medical examination without abnormal findings: Secondary | ICD-10-CM

## 2010-08-31 DIAGNOSIS — G5690 Unspecified mononeuropathy of unspecified upper limb: Secondary | ICD-10-CM

## 2010-08-31 DIAGNOSIS — F411 Generalized anxiety disorder: Secondary | ICD-10-CM

## 2010-08-31 DIAGNOSIS — G5600 Carpal tunnel syndrome, unspecified upper limb: Secondary | ICD-10-CM

## 2010-08-31 DIAGNOSIS — E669 Obesity, unspecified: Secondary | ICD-10-CM | POA: Insufficient documentation

## 2010-08-31 DIAGNOSIS — Z111 Encounter for screening for respiratory tuberculosis: Secondary | ICD-10-CM

## 2010-08-31 DIAGNOSIS — N920 Excessive and frequent menstruation with regular cycle: Secondary | ICD-10-CM

## 2010-08-31 HISTORY — DX: Unspecified mononeuropathy of unspecified upper limb: G56.90

## 2010-08-31 MED ORDER — TUBERCULIN PPD 5 UNIT/0.1ML ID SOLN
5.0000 [IU] | Freq: Once | INTRADERMAL | Status: AC
  Administered 2010-08-31: 5 [IU] via INTRADERMAL

## 2010-08-31 NOTE — Progress Notes (Signed)
  Subjective:    Patient ID: [name], female    DOB: 1960-08-24, 50 y.o.   MRN: [id number]  HPIHere for annual physical.  Brings form to fill out- plans to adopt some foster children. Has OB-gyn for gynecological care.  Weight loss:  Would like to work on losing weight, asks about tips on nutrition and exercise  Bilateral 5th digital numbness- in past few months, has has tingling in 5th digit bilaterally that wakes her up at night.  Also notices at times when on elliptical machine.  Review of Systems  Constitutional: Negative for fever, appetite change and unexpected weight change.  HENT: Negative for hearing loss and sore throat.   Eyes: Negative for visual disturbance.  Respiratory: Negative for cough and shortness of breath.   Cardiovascular: Negative for chest pain.  Gastrointestinal: Negative for nausea, vomiting, abdominal pain and constipation.  Genitourinary: Negative for dysuria.  Neurological: Positive for numbness. Negative for dizziness and weakness.  Psychiatric/Behavioral: Negative for dysphoric mood. The patient is not nervous/anxious.       Objective:   Physical Exam  Constitutional: She is oriented to person, place, and time. She appears well-developed and well-nourished.  HENT:  Head: Normocephalic and atraumatic.  Right Ear: External ear normal.  Left Ear: External ear normal.  Mouth/Throat: No oropharyngeal exudate.  Eyes: Conjunctivae are normal. Pupils are equal, round, and reactive to light.  Neck: Neck supple. No thyromegaly present.  Cardiovascular: Normal rate and regular rhythm.   No murmur heard. Pulmonary/Chest: Effort normal and breath sounds normal. No respiratory distress. She has no wheezes.  Abdominal: Soft. Bowel sounds are normal. She exhibits no distension and no mass. There is no tenderness. There is no rebound and no guarding.  Musculoskeletal: She exhibits no edema.       Neg tinels, can reproduce 5th digit tingling with flexion at  wrist. Normal strength and sensation to light touch.  Neurovascularly intact.  Neurological: She is alert and oriented to person, place, and time.  Psychiatric: She has a normal mood and affect. Her behavior is normal. Judgment and thought content normal.          Assessment & Plan:

## 2010-08-31 NOTE — Assessment & Plan Note (Signed)
Discussed combination of aerobic and weight-bearing exercise 30-45 minutes most days of the week.  She will check into her insurance coverage for medical nutrition therapy, advised to make appt with Dr. Gerilyn Pilgrim.

## 2010-08-31 NOTE — Patient Instructions (Addendum)
Remember to come back to have TB test read on Friday afternoon (make nurse visit appt) Best of luck!!! See info on carpal tunnel- I think your finger pain is more related to ulnar nerve problems

## 2010-08-31 NOTE — Assessment & Plan Note (Signed)
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  Advised wrist splints at night.  Will consider referral to neuro for EMG if no improvement.

## 2010-08-31 NOTE — Assessment & Plan Note (Signed)
Hgb in march wnl.  Patient to follow-up with GYN when ready for surgery

## 2010-08-31 NOTE — Assessment & Plan Note (Signed)
Will recheck vitamin D today, patient has completed 6 weeks of vitamin D

## 2010-09-01 ENCOUNTER — Telehealth: Payer: Self-pay | Admitting: Family Medicine

## 2010-09-01 LAB — VITAMIN D 25 HYDROXY (VIT D DEFICIENCY, FRACTURES): Vit D, 25-Hydroxy: 20 ng/mL — ABNORMAL LOW (ref 30–89)

## 2010-09-01 MED ORDER — ERGOCALCIFEROL 1.25 MG (50000 UT) PO CAPS: 50000.0000 [IU] | ORAL_CAPSULE | ORAL | Status: DC

## 2010-09-01 NOTE — Telephone Encounter (Signed)
Calling to discuss vitamin D level with patient.  Will send in prescription for treatment and letter.  Asked patient to call back to discuss

## 2010-09-02 NOTE — Telephone Encounter (Signed)
i have phoned it in

## 2010-09-05 ENCOUNTER — Ambulatory Visit (INDEPENDENT_AMBULATORY_CARE_PROVIDER_SITE_OTHER): Payer: 59 | Admitting: *Deleted

## 2010-09-05 DIAGNOSIS — Z111 Encounter for screening for respiratory tuberculosis: Secondary | ICD-10-CM

## 2010-09-05 MED ORDER — TUBERCULIN PPD 5 UNIT/0.1ML ID SOLN
5.0000 [IU] | Freq: Once | INTRADERMAL | Status: AC
  Administered 2010-09-05: 5 [IU] via INTRADERMAL

## 2010-09-06 NOTE — Consult Note (Signed)
NAMEFRIMET, DURFEE                ACCOUNT NO.:  [id number]   MEDICAL RECORD NO.:  [id number]          PATIENT TYPE:  INP   LOCATION:  3710                         FACILITY:  [facility]   PHYSICIAN:  Armanda Magic, M.D.     DATE OF BIRTH:  [birth year]   DATE OF CONSULTATION:  11/08/2007  DATE OF DISCHARGE:                                 CONSULTATION   REFERRING PHYSICIAN:  Pearlean Brownie, MD.   CHIEF COMPLAINT:  Chest pain.   HISTORY OF PRESENT ILLNESS:  This is a 50 year old female with no known  history of coronary artery disease who complains of fatigue and  intermittent chest pain for 2 weeks.  At last evening, she awoke around  11:00 a.m. with her heart racing and tightness or hurting sensation in  her chest.  She says that it felt like a tremendous amount of pressure  lying on her chest and radiating into her left shoulder and upper back.  She got up and took an aspirin but this did not help.  She presented to  the emergency room where she was given sublingual nitroglycerin which  relieved her pain.  Of note, she had eaten dinner at 8 o'clock last  night and had [place].   She was hospitalized in 2003 with hypertension and abnormal EKG.  She  also had chest pain at that time.  A 2-D echocardiogram showed EF of 60%  with no regional wall motion abnormalities and mild-to-moderate  increased LV wall thickness.  She also mentioned she had a stress test  around that time as well and was given medicines through an IV and she  says she failed this but did not undergo cardiac catheterization and was  treated with medicines.  Currently, she is pain-free.   ALLERGIES:  CODEINE which causes a rash.   MEDICATIONS:  1. Aspirin 325 mg a day.  2. Lopressor 12.5 mg b.i.d.  3. Lovenox 40 mg subcu daily.  4. Plavix 75 mg a day.  5. Prinivil 20 mg a day.   SOCIAL HISTORY:  She denies any tobacco or alcohol use.  No illicit drug  use.   FAMILY HISTORY:  Negative for coronary artery  disease.   PAST MEDICAL HISTORY:  1. Hypertension.  2. Constipation.   REVIEW OF SYSTEMS:  Otherwise stated in the HPI.  All 12-review of  systems is negative.   PHYSICAL EXAMINATION:  VITAL SIGNS:  Blood pressure is 102/66 mmHg,  heart rate 76 beats per minute, respirations 18, she is afebrile, and O2  saturation is 99% on room air.  HEENT:  Benign.  NECK:  Supple without lymphadenopathy.  Carotid upstrokes +2  bilaterally.  No bruits.  LUNGS:  Clear to auscultation throughout.  HEART:  Regular rate and rhythm.  No murmurs, rubs, or gallops.  Normal  S1 and S2.  ABDOMEN:  Benign.  EXTREMITIES:  No edema.   LABORATORY DATA:  Sodium is 140, potassium 3.7, chloride 105, BUN 18,  creatinine 1.2, and glucose 103.  White cell count 12, hemoglobin 13.1,  hematocrit 38.3, and platelet count 235.  Point-of-care enzymes  are  negative x1.  CPK 121, MB 33.7, troponin 0.01, and D-dimer less than  0.22.  Chest x-ray shows mild pulmonary vascular congestion.  No change  from 2005.  EKG shows sinus rhythm with diffuse ST-T wave abnormality in  the inferior and lateral precordial leads, most likely secondary to LVH  with repolarization change but cannot rule out ischemia based on EKG.   ASSESSMENT:  1. Chest pain, atypical in presentation and most likely secondary to      gastroesophageal reflux disease with esophageal spasm given the      fact that she ate her dinner at 8:00 p.m. last night and had      [place].  Enzymes thus far are negative, but her EKG is abnormal.  2. Left ventricular hypertrophy on EKG.  3. Hypertension.   PLAN:  We will plan a stress Cardiolite study in the morning to rule out  inducible ischemia.  If this is negative, we will discharge to home.      Armanda Magic, M.D.  Electronically Signed     TT/MEDQ  D:  11/08/2007  T:  11/08/2007  Job:  [id number]   cc:   Pearlean Brownie, M.D.

## 2010-09-06 NOTE — H&P (Signed)
NAMEMARLETTE, [name]                ACCOUNT NO.:  [id number]   MEDICAL RECORD NO.:  [id number]          PATIENT TYPE:  INP   LOCATION:  1824                         FACILITY:  [facility]   PHYSICIAN:  Pearlean Brownie, M.D.DATE OF BIRTH:  [birth year]   DATE OF ADMISSION:  11/08/2007  DATE OF DISCHARGE:                              HISTORY & PHYSICAL   PRIMARY CARE PHYSICIAN:  Delbert Harness, MD, at the [facility].   NAME ALERT:  The patient is also on record at the [facility] as [name].  Date of birth, September 23, 1960,  and medical record number [id number].   CHIEF COMPLAINT:  Chest pain.   HISTORY OF PRESENT ILLNESS:  This is a 50 year old female presenting  with atypical chest pain.  She woke at 11 p.m. tonight with substernal  tightness, chewed an aspirin, and went back to sleep.  She awoke again  at midnight with persistent substernal tightness that radiated to her  left shoulder and back, and at that time, she checked her blood  pressure, which was 141/79 at home.  She came to the hospital because of  the persistence of this pain.  The pain was associated with  palpitations, but no sweating, nausea, dizziness, shortness of breath,  or tachypnea.  She had been feeling fatigued for 2 weeks prior to  tonight and has had similar episodes of chest pain during these past 2  weeks.  This is similar to the pattern that caused her to present to  [facility] on previous occasions.  She wonders if her chest  pain is stress related.  She states that she is under a lot of family  and work stress, and states I do not deal with stress well.  The  stress is worse lately.  She says that she is particularly worried about  chest pain because her first husband died of a sudden first MI at age  56, and a female acquainted similarly just died from a sudden first MI  at age 57.   PAST MEDICAL HISTORY:  1. Hospitalizations for chest pain in  2003 and 2007.  Negative cardiac      workup.  The patient has no cardiologist.  2. Negative GI workup in 2009 for a positive fecal-occult blood test.      She was treated with antibiotics for 2 weeks.  Possible H.  pylori      eradication, but unknown.  3. Hypertension.  Well controlled on lisinopril and      hydrochlorothiazide.  4. Menorrhagia.  Followed by Dr. Estanislado Pandy at [facility].  No      anemia.  5. Insomnia.   PAST SURGICAL HISTORY:  1. Cervical conization.  2. Bilateral tubal ligation.   FAMILY HISTORY:  Negative for coronary artery disease.  Mother is alive  and healthy at age 15.  Father died at age 35 from cirrhosis.  Brothers  and children have asthma.   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  She lives in [place] with her husband, mother, two  adult children, and 42-month-old granddaughter.  She does weight training  exercises two times per week and aerobic exercises three times per week.  She denies alcohol, tobacco, or illicit drug use.   ALLERGIES:  CODEINE causes rash and pruritus.   MEDICATIONS:  Lisinopril/hydrochlorothiazide (20/25) 1 tablet p.o.  daily.   REVIEW OF SYSTEMS:  Per HPI and positive for fatigue, dyspnea on  exertion, intermittent bilateral lower extremity edema (none now) and  negative for orthopnea, PND, fevers, chills, vision changes, headache,  nausea, vomiting, diarrhea, and urinary symptoms.   PHYSICAL EXAMINATION:  VITAL SIGNS:  Temperature 98.5, heart rate 85,  respirations 14, blood pressure 122/62, and saturation 98% on room air.  GENERAL:  Alert and oriented x3 in no acute distress, pleasant, and  cooperative.  HEENT:  Moist mucous membranes.  Extraocular muscles intact.  Pupils  equally round and reactive to light.  Thyroid is mobile, nontender  without nodules or enlargement.  CARDIOVASCULAR:  Regular rate and rhythm without murmurs, rubs, or  gallops.  Radial pulses 2+  bilateral.  No JVD.  Normal precordium.  PULMONARY:  Clear to auscultation bilateral.  Normal work of breathing.  ABDOMEN:  Normoactive bowel sounds, soft, and nontender without masses  or hepatosplenomegaly.  NEUROLOGIC:  Cranial nerves II through XII are intact.  DTRs 2+ in  bilateral upper extremities and 5/5 strength in bilateral upper  extremities.  EXTREMITIES:  Nontender without edema in her bilateral lower  extremities.   LABORATORY DATA:  Point-of-care cardiac enzymes are negative x1 and D-  dimer is less than 0.22.  Basic metabolic panel:  Sodium 140, potassium  3.7, chloride 105, bicarb 25, BUN 18, creatinine 1.2, and glucose 103.  CBC:  White blood count 12.0, hemoglobin 13.1, hematocrit 38.3,  platelets 235, and absolute neutrophil count 7.7.  Portable chest x-ray  shows mild pulmonary vascular congestion, which is similar to a chest x-  ray from July 21, 2003.  EKG is normal sinus rhythm at 90 beats per  minute with left ventricular hypertrophy and inverted T-waves in I, II,  III, aVL, aVF, and V3-V6.  This is consistent with the EKGs, which are  available for comparison from May 21, 2007, and July 01, 2003.   ASSESSMENT/PLAN:  This is a 50 year old female with chest pain.  1. Atypical chest pain.  Risk factors for coronary artery disease      include hypertension only.  PE is essentially ruled out with a      negative D-dimer.  Chest x-ray is negative for widened aortic arch      or pneumothorax.  EKG, however, is showing inverted T-waves, which      are relatively stable over 4 years.  Cardiac enzymes will be      repeated x2 and we will repeat the EKG in the morning.  As noted      above, her point-of-care cardiac enzymes are negative.  We will      start aspirin, Plavix, low-dose beta blocker, Lovenox, and p.r.n.      nitroglycerin.  We will continue her home lisinopril.  We will also      call cardiology consult in the morning.  Of note, the chest pain      may  have a stress or anxiety component, see below.  2. Light stress.  The patient admits to being under a lot of stress,      which she believes may  be contributing to her chest pain.  She      copes by exercising and is open to explore new ways to cope with      her stress.  3. Hypertension.  Well controlled.  Continue home dose of lisinopril      and held home dose of hydrochlorothiazide while adding a beta      blocker for chest pain.  We will resume hydrochlorothiazide if the      pressures are stable on the beta blocker.  4. FEN/GI.  N.p.o. pending cardiology workup.  Start a heart-healthy      diet, if not going to cardiac catheterization      in the morning.  Maintenance IV fluids will be D5 half normal      saline at 125 mL/h.  5. Prophylaxis.  Lovenox 40 mg subcu daily.  6. Disposition.  Pending chest pain workup.  Likely home on November 08, 2007.      Romero Belling, MD   Electronically Signed     ______________________________  Pearlean Brownie, M.D.    MO/MEDQ  D:  11/08/2007  T:  11/08/2007  Job:  [id number]

## 2010-09-06 NOTE — Cardiovascular Report (Signed)
[name], [name]                ACCOUNT NO.:  [id number]   MEDICAL RECORD NO.:  [id number]          PATIENT TYPE:  INP   LOCATION:  3710                         FACILITY:  [facility]   PHYSICIAN:  Armanda Magic, M.D.     DATE OF BIRTH:  29-Dec-1960   DATE OF PROCEDURE:  DATE OF DISCHARGE:                            CARDIAC CATHETERIZATION   REFERRING PHYSICIAN:  Pearlean Brownie, MD   PROCEDURE:  1. Left heart catheterization.  2. Coronary angiography.  3. Left ventriculography.   OPERATOR:  Armanda Magic, MD   INDICATIONS:  Chest pain, abnormal Cardiolite.   COMPLICATIONS:  None.   IV ACCESS:  Via right femoral artery, 6-French sheath.   This is a 50 year old African-American female who presented with  abnormal EKG and chest pain.  She underwent stress Cardiolite study,  which showed an anterior wall perfusion defect, now presents for cardiac  catheterization.   The patient was brought to the Cardiac Catheterization Laboratory in a  fasting nonsedated state.  Informed consent was obtained.  The patient  was connected to continuous heart rate and pulse oximetry monitoring and  intermittent blood pressure monitoring.  The right groin was prepped and  draped in a sterile fashion.  Xylocaine 1% was used for local  anesthesia.  Using modified Seldinger technique, a 6-French sheath was  placed in the right femoral artery.  Under fluoroscopic guidance, a 6-  [place] JL-4 catheter was placed in the left coronary artery.  Multiple  cine films were taken at 30-degree RAO and 40-degree LAO views.  This  catheter was exchanged out over a guidewire for 6-French angled pigtail  catheter, which was placed under fluoroscopic guidance in the left  ventricular cavity.  Left ventriculography was performed in a 30-degree  RAO view using a total of 30 mL of contrast at 15 mL per second.  Catheter was then pulled back across the aortic valve with no  significant gradient noted.  At the end of the  procedure, all catheters  and instruments were removed.  Manual compression was performed until  adequate hemostasis was obtained.  The patient was transferred back to  room in stable condition.   RESULTS:  The left main coronary artery is widely patent and bifurcates  into the left anterior descending artery and left circumflex artery.   The left anterior descending artery is widely patent throughout its  course to the apex.  It gives rise to 2 diagonal branches, the first  diagonal branch is very large and bifurcates into 2 daughter branches,  both of which are widely patent.  The second diagonal branch is also of  a moderate-sized vessel and bifurcates into 2 daughter vessels, both of  which are widely patent.   The left circumflex is widely patent throughout its course in the AV  groove.  It gives rise to a very large obtuse marginal-1 branch, which  branches into 4 different branches, all of which are widely patent.   The right coronary artery is widely patent throughout its course  distally and bifurcates into posterior descending artery and  posterolateral artery.  Left ventriculography shows hyperdynamic LV function with a spade-shaped  left ventricular cavity consistent with apical hypertrophy, EF 65%, LV  pressure 108/2 mmHg, aortic pressure 112/58 mmHg.  There was mild MR  noted.   ASSESSMENT:  1. Normal coronary arteries.  2. Normal left ventricular function.  3. Apical hypertrophy.  4. Noncardiac chest pain.   PLAN:  Okay to discharge to home after 6 hours of bedrest and IV fluid  hydration.  Further workup of noncardiac chest pain per primary MD.      Armanda Magic, M.D.  Electronically Signed     TT/MEDQ  D:  11/12/2007  T:  11/13/2007  Job:  [id number]   cc:   Pearlean Brownie, M.D.

## 2010-09-06 NOTE — Discharge Summary (Signed)
[name], [name]                ACCOUNT NO.:  [id number]   MEDICAL RECORD NO.:  [id number]          PATIENT TYPE:  INP   LOCATION:  3710                         FACILITY:  [facility]   PHYSICIAN:  Santiago Bumpers. Hensel, M.D.DATE OF BIRTH:  Nov 27, 1960   DATE OF ADMISSION:  11/08/2007  DATE OF DISCHARGE:  11/12/2007                               DISCHARGE SUMMARY   PRIMARY CARE Tata Timmins:  Dr. Delbert Harness at the [facility].   DISCHARGE DIAGNOSES:  1. Atypical chest pain.  2. Hypertension.  3. Hyperlipidemia.   DISCHARGE MEDICATIONS:  1. Lisinopril/HCTZ 20/25 p.o. daily.  2. Metoprolol 12.5 mg p.o. 2 times daily.  This is a new medication.  3. Aspirin 81 mg p.o. daily.   CONSULTS:  Cardiology was consulted when the patient was admitted.   PROCEDURES:  1. The patient had a 2-D echo on November 10, 2007, which showed apical      wall motion abnormality on the nuclear stress test with most likely      due to artifacts from severe apical hypertrophy.  No wall motion      abnormality is noted on the echocardiogram.  The patient had a      Myoview test, which showed exercise-induced reversibility within      the mid and apical segments of anterior wall and left ventricular      apex.  Also, showed decreased left ventricular systolic function.      There is impaired motion and thickening involving the left      ventricular apex, lateral wall, and inferior septum.  Left      ventricular ejection fraction of 44%.  2. Myoview test was done on November 10, 2007.  3. The patient had a chest x-ray on November 06, 2007, which showed mild      pulmonary vascular congestion similar to a prior study.  4. Cardiac catheterization on November 12, 2007 showed normal coronary      arteries, normal left ventricular function.  There is apical      hypertrophy and noncardiac chest pain.   LABS:  On the day of discharge, sodium was 135, potassium 4.2, chloride  106, bicarb 23, BUN 14, creatinine 0.93, glucose  109, and calcium 9.1.  Fasting lipid panel showed triglyceride 52, HDL of 52, and LDL of 110.   BRIEF HOSPITAL COURSE:  This is a 50 year old female who was admitted  for atypical chest pain.  Serial cardiac enzymes were negative, but upon  admission, the patient had EKGs that showed left ventricular hypertrophy  and marked ST elevation in the inferolateral leads.  The patient had  stress test, 2-D echo, and cardiac cath were negative and upon negative  results, the patient was discharged.   1. Atypical chest pain.  The patient had some chest pains that were      relieved with nitroglycerin.  The chest pain presented on admission      and the night after admission.  She had serial cardiac enzymes that      were negative, but because of the continued chest pain, Cardiology  was consulted and decided that the patient would benefit from a      Myoview test.  The Myoview test showed some abnormality in the wall      motion, a 2-D echo was followed, which showed normal apical wall      motion with left ventricular ejection fraction of 75-80%, but      because there were some abnormalities in the wall motion from this      Myoview test, a cardiac cath was performed, which showed no      coronary artery disease.  Upon this, the patient was discharged to      home with an addition of metoprolol to her regimen for      cardioprotection.  2. Hypertension.  Blood pressure was well controlled during this      admission.  She was taking HCTZ and lisinopril combination of 25 mg      HCTZ and 20 mg lisinopril.  We continued her on this dose during      hospitalization.  Metoprolol was added to pills due to anticipation      of the Myoview test and the patient was sent home with instructions      to start metoprolol 12.5 mg p.o. daily.  3. Hyperlipidemia.  Fasting lipid panel showed an LDL of 110.  The      patient did not have a family history of coronary artery disease      and she does not have  other risk factors besides hypertension, but      she should have a LDL of less than 100.  The patient exercises 5      times a week and her weight is proportionate to her height.      Lifestyle modification may be warranted, but if she is not able to      decrease her LDL with changing her diet, then her primary care      physician, Dr. Earnest Bailey, may consider starting a statin as an      outpatient.  Her liver enzymes were within normal limits on November 08, 2007.   DISCHARGE INSTRUCTION:  The patient was discharged to home with no  restrictions.  She is to have a low sodium, heart-healthy diet.   </FOLLOW UP APPOINTMENTS>/  The patient is to have a followup visit with her primary care doctor,  Dr. Delbert Harness, at the [facility] on November 21, 2007 at 1:30.   DISCHARGE CONDITION:  The patient was discharged home in stable medical  condition.      [name], MD  Electronically Signed      Santiago Bumpers. Leveda Anna, M.D.  Electronically Signed    CT/MEDQ  D:  11/13/2007  T:  11/14/2007  Job:  [id number]   cc:   Delbert Harness, MD

## 2010-09-07 ENCOUNTER — Ambulatory Visit (INDEPENDENT_AMBULATORY_CARE_PROVIDER_SITE_OTHER): Payer: 59 | Admitting: Family Medicine

## 2010-09-07 DIAGNOSIS — Z111 Encounter for screening for respiratory tuberculosis: Secondary | ICD-10-CM

## 2010-09-07 DIAGNOSIS — IMO0001 Reserved for inherently not codable concepts without codable children: Secondary | ICD-10-CM

## 2010-09-07 NOTE — Progress Notes (Signed)
PPD negative-0 mm. 

## 2010-09-09 NOTE — Consult Note (Signed)
NAME:  [name], [name]                          ACCOUNT NO.:  [id number]   MEDICAL RECORD NO.:  [id number]                   PATIENT TYPE:  INP   LOCATION:  2028                                 FACILITY:  [facility]   PHYSICIAN:  Salvadore Farber, M.D. [facility]         DATE OF BIRTH:  [birth year]   DATE OF CONSULTATION:  DATE OF DISCHARGE:                                   CONSULTATION   CHIEF COMPLAINT:  Chest, shoulder pain, shortness of breath.   HISTORY OF PRESENT ILLNESS:  The patient is 50 year old lady with a history  of hypertension which was treated for approximately one month a year ago and  for which she has not had subsequent care. Starting five days ago she  developed severe headache, fatigue and dizziness. The following day she had  several episodes of brief substernal chest pain, also associated with pain  on the left side of her neck and left shoulder. Over the subsequent couple  of days, the headache, feelings of fatigue and episodes of chest pain all  increased in severity and became associated with dyspnea. These were better  over the subsequent day or so. Yesterday, she checked her blood pressure at  a supermarket and found it to be 180/105.   Last night she had recurrence of all of the above symptoms now associated  with marked orthopnea. This has prompted her to present to the hospital. She  denies a positional nature to this chest pain, a pleuritic nature to this  chest pain, recent chest trauma, cough or fever.   PAST MEDICAL HISTORY:  Hypertension.   ALLERGIES:  CODEINE.   SOCIAL HISTORY:  She was widowed after her husband died at age 48 of MI. She  works as a IT consultant. She lives her five children, age 48 to 19. No history  of tobacco use. No alcohol use. No illicit drug use.   FAMILY HISTORY:  No premature atherosclerotic disease, stroke or diabetes  mellitus.   REVIEW OF SYSTEMS:  Negative in detail except as above  with the exception  of occasional snore  and occasional bipedal edema.   PHYSICAL EXAMINATION:  GENERAL:  She is a generally well appearing young  woman in no distress. While I was in the room she had a fleeting episode of  substernal chest discomfort.  VITAL SIGNS:  Heart rate 80, blood pressure 180/110 in the right arm and  176/104 in the left arm.  NECK:  She has no jugular venous  distention.  LUNGS: Clear to auscultation bilaterally.  HEART:  Regular rate and rhythm with a 2/6 systolic ejection murmur heard at  both upper sternal borders and out to the apex. There is an S4 but no S3.  ABDOMEN:  Soft, nontender, nondistended. There is no hepatosplenomegaly.  Normoactive bowel sounds.  EXTREMITIES:  Warm without cyanosis, clubbing or ulceration.  VASCULAR:  Carotid pulses are 2+ bilaterally without bruits. Radial pulses  are 2+ bilaterally. Femoral pulses are 2+ bilaterally without bruits.  Dorsalis pedis pulses and posterior tibial pulses are 2+ bilaterally.   LABORATORY DATA:  Remarkable for creatinine 0.9, sodium 138, potassium 4.0.  Hematocrit 45. D-dimer negative. Negative toxicology screen. Troponin 0.01,  CK 282, CK-MB 6.4.   An electrocardiogram from 8:30 this morning demonstrated normal sinus rhythm  with marked left ventricular hypertrophy. There was 2 mm of ST elevations in  V1 and V2, most suggestive of  normal early repolarization. There were  diffuse ST depressions and T-wave inversions. The electrocardiogram is most  consistent with LVH with normal early repolarization and strain. However, it  is not inconsistent with acute myocardial infarction.   IMPRESSION/PLAN:  Chest pain in a young woman  without cardiac risk factors  other than severely uncontrolled hypertension. Her several day history of  complaints including chest pain, dizziness, headache and dyspnea are most  consistent with malignant hypertension and secondary subendocardial  ischemia. However, it is not clearly inconsistent with  myocardial   infarction with preceding unstable angina.   We will obtain a repeat electrocardiogram immediately. A second set of  cardiac enzymes will be sent stat now. Will give IV Labetalol now to improve  blood pressure control. Will check a CT scan of the chest to exclude aortic  dissection which I think is unlikely. While obtaining  this, we will stop  her  IV heparin, as I  think an acute coronary syndrome is unlikely and the  blood pressure remains uncontrolled. Will continue therapy with aspirin and  Labetalol for the short term. Once blood pressure control is established,  will discontinue the nitroglycerin. Further management will depend on large  part on the result of the repeat electrocardiogram and the cardiac enzymes  which are being sent.                                               Salvadore Farber, M.D. [facility]    WED/MEDQ  D:  04/13/2002  T:  04/14/2002  Job:  [id number]   cc:   Arminda Resides, M.D.  Family Practice Teaching Service

## 2010-09-09 NOTE — Discharge Summary (Signed)
NAME:  CHARLISSA, PETROS                          ACCOUNT NO.:  [id number]   MEDICAL RECORD NO.:  [id number]                   PATIENT TYPE:  INP   LOCATION:  2028                                 FACILITY:  [facility]   PHYSICIAN:  Leighton Roach McDiarmid, M.D.             DATE OF BIRTH:  [birth year]   DATE OF ADMISSION:  04/13/2002  DATE OF DISCHARGE:  04/15/2002                                 DISCHARGE SUMMARY   DISCHARGE MEDICATIONS:  1. Labetalol 200 mg p.o. b.i.d.  2. Hydrochlorothiazide 25 mg p.o. q.d.  3. Nitroglycerin 0.4 mg sublingual one tablet every five minutes x3 for     substernal chest pain.  4. Aspirin 325 mg one tablet p.o. q.d.  5. Naprosyn 500 mg one tablet p.o. b.i.d. p.r.n. for shoulder pain.   DIET:  Dietary restrictions of less than 2,000 mg sodium diet.   FOLLOW UP:  Cardiac echocardiogram with the [place] office on 1/5 at 1 p.m.  Adenosine Cardiolite at the [place] office on Monday, 1/5 at 1 p.m. by Dr.  Samule Ohm ______________ will followup on 1/12 at 4:30. The patient will also  followup with Dr. Virgia Land at the [facility] family practice on Wednesday,  1/14 at 1:30 p.m.   DISCHARGE DIAGNOSES:  1. Malignant hypertension.  2. Left shoulder pain.  3. Hypokalemia.   PROCEDURE PERFORMED:  Two-D echocardiogram which showed an EF of 55 to 65%.  No left ventricular regional wall abnormalities. Mild to moderate LV wall  thickness. Aortic valve thickness mildly increased, estimated peak of  pulmonary artery systolic pressure of 37 mg and trivial pericardial  effusion. The patient also had a CT of the abdomen and chest which were  negative for aortic dissection and a chest x-ray obtained at the time of  admission revealed peribronchial thickening with no acute airway disease.   BRIEF HISTORY:  This is a 50 year old African-American female with one week  history of shortness of breath, chest pain, nausea, left sided shoulder  pain, and neck pain. The patient's pain was  4/10, was constricting.  Substernal pain intermittent, lasting approximately 15 to 20 minutes. She  complains of chest pain and arm pain relieved by rest and deep breathing.  The patient had not increased in intensity over the last week. No previous  signs or symptoms.   PAST MEDICAL HISTORY:  Only significant for previously treated hypertension  for a short period of time, and then patient was taken off medications.   FAMILY HISTORY:  Negative for coronary artery disease, diabetes, or CVA.   SOCIAL HISTORY:  The patient lives with her five children. She is a  IT consultant. No alcohol. No tobacco. Not sexually active. Husband died at age  54 of a massive MI. The patient has not seen a doctor in some significant  time.   REVIEW OF SYMPTOMS:  Positive for dizziness, headache for two days. Good  appetite. Increase in exercise  intolerance over the last year. No paroxysmal  dyspnea. Two to three pillow orthopnea. Positive pedal edema. Positive  restless sleep. Positive ___________ while sleeping.   HOSPITAL COURSE:  Problem 1. EKG was obtained at time of admission for chest  pain. EKG revealed normal sinus rhythm, LVH 10 mm ST elevation in V1 and V2,  diffuse ST depression and T-wave inversion most consistent with LVH and  strain pattern. Cardiac enzymes were also obtained at the time of admission  which revealed a CK of 214 initially, MB of 4.9, relative index of 2.3,  troponin of 0.01. This was peak for all studies and further subsequent  serial enzymes were negative. Cardiology was consulted and believed that  patient's chest pain was consistent with malignant hypertension uncontrolled  for several years with subendocardial ischemia and LVH. The patient was  started on p.o. labetalol as well as IV labetalol for high systolic blood  pressures. The patient was also started on low dose hydrochlorothiazide,  nitroglycerin drip, and heparin drip. The patient was further taken off the   nitroglycerin and heparin drip when possibility of aortic dissection was  considered, and the patient went for a CT of the abdomen and chest which  revealed no aortic dissection. Repeat EKGs were consistent with LVH, and  blood pressure was further controlled with increasing hydrochlorothiazide.  The patient at the time of discharge will be sent home on labetalol 200 mg  p.o. b.i.d., hydrochlorothiazide 25 mg p.o. q.d. The patient had an  echocardiogram as well which revealed results as noted above. The patient  will follow up with [facility] cardiology on 1/5 for outpatient Cardiolite and  repeat echocardiogram. The patient will also followup at [facility] family  practice with Dr. Virgia Land for further hypertensive management. Appreciate  cardiology help with this patient.   Problem 2. Hypokalemia. The patient had potassium of 3.2 on day #2 of  admission. On day #2 of hospital course, the patient was repleted with K-Dur  and further found to be 4.5. No further treatment needed.   Problem 3. Left shoulder pain. The patient had complaint of left shoulder  pain which was negative for ____________ sign.  Pain was more in the  trapezius and supraspinatus muscle, tender to the medial left scapular  border with positive Spurling sign. The patient was started on Naprosyn at  500 mg p.o. b.i.d. for 7 to 10 days for symptomatic relief. The patient will  follow up with Dr. Virgia Land at [facility] family practice for further  evaluation if necessary.   Problem 4. Hypertension. See above hospital course for chest pain.  Hypertension was stable at time of discharge with systolics running 130s to  150s, diastolics of 70s to 90s, with pulses ranging in the 60s to 70s. The  patient will require further titration of blood pressure medications as an  outpatient.   Problem 5. Health maintenance. The patient had fasting lipid panel and hemoglobin A1c's performed while inpatient. Fasting lipid panel revealed  a  cholesterol of 143, triglycerides 75, HDL 39, LDL of 89, A1c of 5.7. No  statins or diabetic management at this point in time, but we will continue  to follow. The patient will be discharged home today on 12/23 in stable and  improved condition and will follow up with appointment as noted above.     Alvira Philips, M.D.                      Leighton Roach.  McDiarmid, M.D.    RM/MEDQ  D:  04/15/2002  T:  04/16/2002  Job:  [id number]   cc:   Salvadore Farber, M.D. [facility]  [address] [place], [place] [address]  Fax: [phone]   [facility]

## 2010-09-09 NOTE — Discharge Summary (Signed)
[name], [name]              ACCOUNT NO.:  [id number]   MEDICAL RECORD NO.:  [id number]          PATIENT TYPE:  INP   LOCATION:  3739                         FACILITY:  [facility]   PHYSICIAN:  Santiago Bumpers. Hensel, M.D.DATE OF BIRTH:  May 07, 1960   DATE OF ADMISSION:  05/02/2005  DATE OF DISCHARGE:  05/03/2005                                 DISCHARGE SUMMARY   PRIMARY CARE PHYSICIAN:  Angie B. Merlene Morse, MD   FINAL DIAGNOSES:  1.  Hypertension.  2.  Back pain, nonspecified.  3.  Questionable history of syncopal episode.  4.  Chest discomfort of noncardiac origin.   HISTORY OF PRESENT ILLNESS:  This is a patient who presented with three  weeks of intermittent chest discomfort described as tension in the chest  with some related shortness of breath. The chest discomfort was not related  to exertion and was not accompanied by nausea, vomiting, or diaphoresis.  There was also some arm and neck stiffness that was unrelated, but occurring  simultaneously. The patient did notice a syncopal episode two weeks ago  when she felt dizzy exercising.   HOSPITAL COURSE:  The patient's hospital course was unremarkable and she did  well throughout. She had three sets of cardiac enzymes and was ruled out for  MI with all those sets of enzymes being negative. Of note she did have an  EKG on admission with some T-wave changes and inversion. These changes were  persistent and were not markedly different on repeat EKG on May 03, 2005.  Her chest pain did not appear to be cardiac in nature and her only  risk factor is hypertension. She had an LDL of 72 in August 2006. She is not  diabetic. She has no family history of coronary artery disease. She denied  tobacco and alcohol, and states that she exercises routinely at the [facility]. In  addition, her chest pain does not occur with exertion and in the hospital  was episodic, occurring at rest on transport to x-ray in 30 second spells.   Please note that  the patient was noted to have a heart rate of 51 to 58  during her stay in the hospital and for this reason her Toprol XL was  discontinued, her blood pressure was well controlled on her Maxzide 37.5/25  mg. It was also felt that Toprol and resultant bradycardia may have  contributed to her possible syncopal episode two weeks prior to admission.  For this reason we suggested outpatient evaluation of blood pressure and  consideration of whether to continue the Toprol on a long-term basis.   The patient had a chest x-ray that was negative for any acute  cardiopulmonary disease.   DISCHARGE DIAGNOSES:  1.  Aspirin 81 mg daily.  2.  Ibuprofen 600 mg p.o. t.i.d. p.r.n. pain.  3.  Flexeril 5 mg p.o. t.i.d. p.r.n. muscle spasm.  4.  Maxzide 37.5/25 mg daily.  5.  Multivitamin daily.  6.  As mentioned in the paragraph above Toprol XL was discontinued and      should be re-evaluated on an outpatient basis.  The patient will have follow up on May 10, 2005, at 1:30 p.m. with Dr.  Merlene Morse, her primary care physician.     ______________________________  Mikey Kirschner    ______________________________  Santiago Bumpers. Leveda Anna, M.D.    FT/MEDQ  D:  05/03/2005  T:  05/03/2005  Job:  [id number]

## 2010-09-09 NOTE — H&P (Signed)
[name], [name]              ACCOUNT NO.:  [id number]   MEDICAL RECORD NO.:  [id number]          PATIENT TYPE:  INP   LOCATION:  NA                           FACILITY:  [facility]   PHYSICIAN:  Santiago Bumpers. Hensel, M.D.DATE OF BIRTH:  1960-09-17   DATE OF ADMISSION:  DATE OF DISCHARGE:                                HISTORY & PHYSICAL   CHIEF COMPLAINT:  Chest pain.   SUBJECTIVE:  The patient is very pleasant 50 year old African-American  female. The last 3 days she has been having intermittent left-sided chest  discomfort that is atypical in nature. She states it mainly strikes her at  rest and at night when she is lying in bed. She does describe it as a  sensation of ton of bricks on her chest. It is nonradiating. It is  associated with some nausea and some vague shortness of breath. Also has  some mild tingling in her neck but no pain in her arm.   She links the chest pain to increasing stress. She says she is having a lot  of trouble sleeping at night. She has also been having worsening tension  headaches and some intermittent neck stiffness that she attributes to  stress. However, in clinic today the patient had an EKG which shows LVH with  borderline LAD, normal axis. However, it has flipped T waves in V3, V4, V5,  and V6, with J point depression of approximately 1 mm in the above-mentioned  leads. She also has flipped T waves in I, II, and aVF. We have no old EKG to  compare this to and this is a markedly abnormal EKG in an a patient who had  chest pain as late as this morning.   The patient's risk factors include hypertension with cardiomegaly per the  patient's report. She does not smoke. She has no family history of early  coronary artery disease. She does not have diabetes. Her last fasting lipid  panel showed an LDL of 72 and that was in August 2006. Given that, her only  risk factor seems to be hypertension.   The patient also had an episode of what sounds like  vasovagal syncope  approximately 2 weeks ago while exercising at the [facility]. She was doing  exercises, she went to the bathroom, and at that time felt very hot and  flushed. She felt like the room was spinning and she became lightheaded. She  felt nauseated. She denies any chest pain or palpitations at the time.  However, she did have to lower herself to the ground at which point she  passed out. She states EMS came to the scene and she said she had a normal  sugar at that point and did not want any further workup.   PAST MEDICAL HISTORY:  1.  Hypertension.  2.  Back pain.   MEDICATIONS:  1.  Aspirin 81 mg p.o. daily.  2.  Ibuprofen p.r.n.  3.  Maxzide 37.5/25 one p.o. daily.  4.  Multivitamin daily.  5.  Toprol-XL 50 mg p.o. daily.   ALLERGIES:  CODEINE.   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   FAMILY HISTORY:  There is no diabetes mellitus, coronary artery disease,  breast cancer in her family. Her mother is healthy. Her father died from  cirrhosis.   SOCIAL HISTORY:  She is married. Her first husband died from a massive MI at  age 27. She has five children, four from her husband's previous marriage.  She works as a Librarian, academic. She exercises routinely. She denies tobacco  ever, alcohol, or drugs.   PHYSICAL EXAMINATION:  VITAL SIGNS:  Noted. She is afebrile with stable  vital signs.  GENERAL:  No apparent distress, alert and oriented x3, pleasant African-  American female. Mental status is appropriate.  HEENT:  Atraumatic, normocephalic. Pupils equal, round, and reactive to  light. Extraocular movements are intact. TMs and canals are clear on  examination. There is no erythema or exudate in the posterior oropharynx.  LUNGS:  Clear to auscultation bilaterally. No wheezes, crackles, rales.  ABDOMEN:  Soft, nontender, nondistended, positive bowel sounds.  CARDIAC:  Regular rate and rhythm. No  murmurs, rubs, gallops. There is no  evidence for costochondritis. The patient also does not have any JVD or  carotid bruit auscultated.  EXTREMITIES:  No cyanosis, clubbing, or edema.  RECTAL:  Deferred.  NEUROLOGIC:  Cranial nerves II-XII are grossly intact. Muscle strength 5/5,  equal and symmetric in the upper and lower extremities with normal reflexes.   EKG is normal sinus rhythm with a pulse rate of 66 and normal intervals.  However, the patient does have LVH with flipped T waves in V3 and V6 with J  point depression in the above-mentioned leads and flipped T waves in the  inferior leads II and aVF.   ASSESSMENT AND PLAN:  1.  Atypical chest pain. I agree with the patient this is likely related to      stress. However, given the profound EKG changes the patient needs a      myocardial infarction rule-out in the hospital and possibly cardiac      evaluation, inpatient versus outpatient. Will admit the patient, give      her aspirin 325 mg p.o. daily, Toprol-XL 50 mg p.o. daily, oxygen as      needed to keep her O2 saturations greater than 92%, and cardiac enzymes.      Will check a set now and then q.8h. after that x2 sets. Will get EKGs      with the first set of cardiac enzymes and the last set of cardiac      enzymes. Will also get a chest x-ray. Will admit her to a telemetry      floor and consider inpatient versus outpatient stress Cardiolite.  2.  Syncope. History sounds consistent with vasovagal syncope. However, the      patient will be monitored on telemetry for 23 hours to rule out adverse      arrhythmias. May also consider a cardiac consult for evaluation of      syncope, although the history sounds classic for vasovagal. There are no      murmurs consistent with aortic stenosis. However, the patient may      benefit from possible monitoring beyond the telemetry as an outpatient. 3.  Hypertension. Will continue the Toprol-XL and the Maxzide at the present       doses.      Broadus John T. Pamalee Leyden, MD    ______________________________  Chrissie Noa  Melene Plan, M.D.   Alvia Grove  D:  05/02/2005  T:  05/02/2005  Job:  [id number]

## 2010-10-10 ENCOUNTER — Other Ambulatory Visit: Payer: Self-pay | Admitting: Family Medicine

## 2010-10-10 NOTE — Telephone Encounter (Signed)
Refill request

## 2010-10-11 ENCOUNTER — Ambulatory Visit: Payer: 59 | Admitting: Cardiology

## 2010-11-08 ENCOUNTER — Ambulatory Visit: Payer: 59 | Admitting: Cardiology

## 2010-11-15 ENCOUNTER — Ambulatory Visit (INDEPENDENT_AMBULATORY_CARE_PROVIDER_SITE_OTHER): Payer: 59 | Admitting: Cardiology

## 2010-11-15 ENCOUNTER — Encounter: Payer: Self-pay | Admitting: Cardiology

## 2010-11-15 VITALS — BP 130/72 | Ht 65.0 in | Wt 196.0 lb

## 2010-11-15 DIAGNOSIS — I421 Obstructive hypertrophic cardiomyopathy: Secondary | ICD-10-CM

## 2010-11-15 NOTE — Assessment & Plan Note (Signed)
Blood pressure controlled. Continue present medications. 

## 2010-11-15 NOTE — Progress Notes (Signed)
HPI: Pleasant female for f/u HCM (apical hypertroph). Apparently had a normal cardiac catheterization in 2009 other than apical hypertrophy. Echocardiogram  was performed in October of 2011 and  revealed normal LV function and apical hypertrophy. There was moderate left atrial enlargement and mild mitral regurgitation. A Holter monitor showed no nonsustained ventricular tachycardia. An exercise treadmill revealed no decrease in systolic blood pressure with exercise. Since I last saw her, she has mild dyspnea on exertion relieved with rest. There is no orthopnea, PND, pedal edema, palpitations, syncope. She denies exertional chest pain.  Current Outpatient Prescriptions  Medication Sig Dispense Refill  . aspirin 81 MG EC tablet Take 81 mg by mouth daily.        . fish oil-omega-3 fatty acids 1000 MG capsule Take 2 g by mouth daily.        Marland Kitchen lisinopril-hydrochlorothiazide (PRINZIDE,ZESTORETIC) 20-25 MG per tablet TAKE 1 TABLET BY MOUTH EVERY DAY  30 tablet  5  . metoprolol (TOPROL-XL) 25 MG 24 hr tablet Take 25 mg by mouth daily.        . Multiple Vitamin (MULTIVITAMIN) tablet Take 1 tablet by mouth daily.           Past Medical History  Diagnosis Date  . Chest pain     cath 7/09: normal cors; EF 65%  . HTN (hypertension)   . Hyperlipidemia   . HOCM (hypertrophic obstructive cardiomyopathy)     echo 10/11: normal LVF; apical hypertropy; mod LAE; mild MR;      Holter 10/11: no NSVT;     ETT 10/11: no decreased BP with exercise     Past Surgical History  Procedure Date  . Tubal ligation     History   Social History  . Marital Status: Married    Spouse Name: N/A    Number of Children: N/A  . Years of Education: N/A   Occupational History  . Not on file.   Social History Main Topics  . Smoking status: Never Smoker   . Smokeless tobacco: Never Used  . Alcohol Use: No  . Drug Use: No  . Sexually Active: Yes    Birth Control/ Protection: None   Other Topics Concern  . 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  Is planning on adopting 3 foster children (2012)    ROS: no fevers or chills, productive cough, hemoptysis, dysphasia, odynophagia, melena, hematochezia, dysuria, hematuria, rash, seizure activity, orthopnea, PND, pedal edema, claudication. Remaining systems are negative.  Physical Exam: Well-developed well-nourished in no acute distress.  Skin is warm and dry.  HEENT is normal.  Neck is supple. No thyromegaly.  Chest is clear to auscultation with normal expansion.  Cardiovascular exam is regular rate and rhythm. 1/6 systolic ejection murmur. Abdominal exam nontender or distended. No masses palpated. Extremities show no edema. neuro grossly intact  ECG normal sinus rhythm at a rate of 65. Short PR interval. Left ventricular hypertrophy with repolarization abnormality.

## 2010-11-15 NOTE — Assessment & Plan Note (Signed)
Patient has a history of apical hypertrophic cardiomyopathy. Continue beta blocker. No risk factors for sudden death.

## 2010-11-15 NOTE — Patient Instructions (Signed)
Your physician wants you to follow-up in: ONE YEAR You will receive a reminder letter in the mail two months in advance. If you don't receive a letter, please call our office to schedule the follow-up appointment.  

## 2011-01-12 LAB — POCT CARDIAC MARKERS
CKMB, poc: 2
Myoglobin, poc: 58.5
Operator id: 257131
Troponin i, poc: <0.05

## 2011-01-12 LAB — I-STAT 8, (EC8 V) (CONVERTED LAB)
Acid-Base Excess: 1
BUN: 11
Bicarbonate: 26.2 — ABNORMAL HIGH
Chloride: 103
Glucose, Bld: 118 — ABNORMAL HIGH
HCT: 46
Hemoglobin: 15.6 — ABNORMAL HIGH
Operator id: 257131
Potassium: 3.5
Sodium: 137
TCO2: 27
pCO2, Ven: 44.1 — ABNORMAL LOW
pH, Ven: 7.381 — ABNORMAL HIGH

## 2011-01-12 LAB — POCT I-STAT CREATININE
Creatinine, Ser: 1.1
Operator id: 257131

## 2011-01-12 LAB — D-DIMER, QUANTITATIVE: D-Dimer, Quant: 0.32

## 2011-01-20 LAB — CBC
Hemoglobin: 13.1
Hemoglobin: 13.7
MCHC: 33.2
MCHC: 34.3
MCV: 81.8
RBC: 4.71
RBC: 5.05

## 2011-01-20 LAB — DIFFERENTIAL
Lymphocytes Relative: 25
Monocytes Absolute: 1
Monocytes Relative: 8
Neutro Abs: 7.7

## 2011-01-20 LAB — POCT I-STAT, CHEM 8
Chloride: 105
Creatinine, Ser: 1.2
Glucose, Bld: 103 — ABNORMAL HIGH
Potassium: 3.7

## 2011-01-20 LAB — COMPREHENSIVE METABOLIC PANEL
ALT: 17
CO2: 29
Calcium: 9.5
GFR calc non Af Amer: >60
Glucose, Bld: 95
Sodium: 136

## 2011-01-20 LAB — POCT CARDIAC MARKERS
CKMB, poc: 2.5
Troponin i, poc: <0.05

## 2011-01-20 LAB — CARDIAC PANEL(CRET KIN+CKTOT+MB+TROPI)
CK, MB: 3.3
Relative Index: 3.2 — ABNORMAL HIGH
Total CK: 104

## 2011-01-20 LAB — BASIC METABOLIC PANEL
CO2: 23
Calcium: 9.1
GFR calc Af Amer: >60
GFR calc non Af Amer: >60
Sodium: 135

## 2011-01-20 LAB — CK TOTAL AND CKMB (NOT AT ARMC)
CK, MB: 3.7
Relative Index: 3.1 — ABNORMAL HIGH
Total CK: 121

## 2011-01-20 LAB — LIPID PANEL
Cholesterol: 162
HDL: 42

## 2011-01-20 LAB — PROTIME-INR
INR: 1
Prothrombin Time: 13

## 2011-02-24 ENCOUNTER — Ambulatory Visit (INDEPENDENT_AMBULATORY_CARE_PROVIDER_SITE_OTHER): Payer: 59 | Admitting: Physician Assistant

## 2011-02-24 DIAGNOSIS — I421 Obstructive hypertrophic cardiomyopathy: Secondary | ICD-10-CM

## 2011-02-24 DIAGNOSIS — R079 Chest pain, unspecified: Secondary | ICD-10-CM

## 2011-02-24 NOTE — Progress Notes (Signed)
History of Present Illness: Primary Cardiologist: Dr. Olga Millers  [name] is a 50 y.o. female with a h/o normal cardiac catheterization in 2009 other than apical hypertrophy.  Of note, a myoview prior to [facility] then was abnormal with EF 44%, mild mid and apical anterior wall ischemia.  Dr. Jens Som had her do an echocardiogram which was performed in October of 2011. This revealed normal LV function and apical hypertrophy. There was moderate left atrial enlargement and mild mitral regurgitation. A Holter monitor showed no nonsustained ventricular tachycardia. An exercise treadmill revealed no decrease in systolic blood pressure with exercise.  She last saw Dr. Jens Som in 7/12 and was to follow up in one year.  Over the past week, she has had chest pain off and on.  This is left sided and feels like a squeezing.  It lasts about 30 seconds.  No radiation.  It takes her breath away.  No increased WOB.  No exertional chest pain.  No syncope.  No orthopnea, PND, or edema.  She feels lightheaded sometimes.  Also has a HA this past week.  Denies dysphagia, odynophagia, belching or water brash.  She denies any pain associated with meals.  No pleuritic symptoms.  No hemoptysis.  No recent travels, hospitalizations, leg injury.    Past Medical History  Diagnosis Date  . Chest pain     cath 7/09: normal cors; EF 65%  . HTN (hypertension)   . Hyperlipidemia   . HOCM (hypertrophic obstructive cardiomyopathy)     echo 10/11: normal LVF; apical hypertropy; mod LAE; mild MR;      Holter 10/11: no NSVT;     ETT 10/11: no decreased BP with exercise     Current Outpatient Prescriptions  Medication Sig Dispense Refill  . aspirin 81 MG EC tablet Take 81 mg by mouth daily.        . fish oil-omega-3 fatty acids 1000 MG capsule Take 2 g by mouth daily.        Marland Kitchen lisinopril-hydrochlorothiazide (PRINZIDE,ZESTORETIC) 20-25 MG per tablet TAKE 1 TABLET BY MOUTH EVERY DAY  30 tablet  5  . metoprolol (TOPROL-XL) 25  MG 24 hr tablet Take 25 mg by mouth daily.        . Multiple Vitamin (MULTIVITAMIN) tablet Take 1 tablet by mouth daily.          Allergies  Allergen Reactions  . Codeine Phosphate Rash    REACTION: unspecified    History  Substance Use Topics  . Smoking status: Never Smoker   . Smokeless tobacco: Never Used  . Alcohol Use: No     ROS:  Please see the history of present illness.  All other systems reviewed and negative.   Vital Signs: BP 130/82  Pulse 61  Ht 5\' 5"  (1.651 m)  Wt 192 lb 12.8 oz (87.454 kg)  BMI 32.08 kg/m2  PHYSICAL EXAM: Well nourished, well developed, in no acute distress HEENT: normal Neck: no JVD Vascular: no carotid bruits Cardiac:  normal S1, S2; RRR; no murmur Chest: no tenderness to palpation Lungs:  clear to auscultation bilaterally, no wheezing, rhonchi or rales Abd: soft, nontender, no hepatomegaly Ext: no edema Skin: warm and dry Neuro:  CNs 2-12 intact, no focal abnormalities  Psych: normal affect  EKG:  NSR; HR 61; LVH with repolarization abnormality; no change since 01/2010  ASSESSMENT AND PLAN:

## 2011-02-24 NOTE — Assessment & Plan Note (Signed)
Recent workup to assess risk for SCD was negative.  Follow up with Dr. Jens Som in 10/2011 as scheduled.

## 2011-02-24 NOTE — Assessment & Plan Note (Addendum)
Atypical.  Her symptoms are not c/w angina.  It is not clearly MSK, GI or pulmonary.  Question if it is anxiety driven.  I had a long d/w the patient regarding the low likelihood that she would have obstructive CAD at this point with a normal cath in 2009.  She also had a false + myoview prior to her LHC.  I have suggested trying Pepcid 20 mg BID.  She can get this OTC.  If she does not get any relief with this or if her symptoms worsen I will go ahead and set her up with a Myoview.  Otherwise, follow up with Dr. Jens Som in 10/2011 as scheduled.

## 2011-02-24 NOTE — Patient Instructions (Signed)
Your physician has recommended you make the following change in your medication: START PEPCID 20 MG 1 TABLET TWICE DAILY TAKE THIS FOR 2-4 WEEKS IF YOUR SYMPTOMS ARE NOT BETTER AFTER 2-4 WEEKS OR IF YOU FEEL THE PEPCID DID NOT HELP PLEASE CALL OUR OFFICE TO LET [address] PA-C/Naphtali Riede [place], [place] KNOW [phone]. Danielle Rankin

## 2011-03-22 ENCOUNTER — Encounter (HOSPITAL_COMMUNITY): Payer: Self-pay | Admitting: *Deleted

## 2011-03-22 ENCOUNTER — Emergency Department (HOSPITAL_COMMUNITY)
Admission: EM | Admit: 2011-03-22 | Discharge: 2011-03-23 | Disposition: A | Discharge status: Home / Self Care | Payer: 59 | Attending: Emergency Medicine | Admitting: Emergency Medicine

## 2011-03-22 ENCOUNTER — Emergency Department (HOSPITAL_COMMUNITY): Payer: 59

## 2011-03-22 ENCOUNTER — Other Ambulatory Visit: Payer: Self-pay

## 2011-03-22 DIAGNOSIS — R079 Chest pain, unspecified: Secondary | ICD-10-CM | POA: Insufficient documentation

## 2011-03-22 DIAGNOSIS — I421 Obstructive hypertrophic cardiomyopathy: Secondary | ICD-10-CM | POA: Insufficient documentation

## 2011-03-22 DIAGNOSIS — R0600 Dyspnea, unspecified: Secondary | ICD-10-CM

## 2011-03-22 DIAGNOSIS — I10 Essential (primary) hypertension: Secondary | ICD-10-CM | POA: Insufficient documentation

## 2011-03-22 DIAGNOSIS — E785 Hyperlipidemia, unspecified: Secondary | ICD-10-CM | POA: Insufficient documentation

## 2011-03-22 DIAGNOSIS — R0989 Other specified symptoms and signs involving the circulatory and respiratory systems: Secondary | ICD-10-CM | POA: Insufficient documentation

## 2011-03-22 DIAGNOSIS — R0609 Other forms of dyspnea: Secondary | ICD-10-CM | POA: Insufficient documentation

## 2011-03-22 DIAGNOSIS — R0602 Shortness of breath: Secondary | ICD-10-CM | POA: Insufficient documentation

## 2011-03-22 MED ORDER — FUROSEMIDE 10 MG/ML IJ SOLN
INTRAMUSCULAR | Status: AC
  Filled 2011-03-22: qty 4

## 2011-03-22 NOTE — ED Notes (Signed)
Per EMS: pt c/o SOB and CP tonight around 2245.  Pt awoke from sleep feeling SOB with CP.  NO n/v.  Given 2 ASA at home and 1 nitro and 40 mg lasix by EMS.  Pt also was on CPAP. Also, earlier noted some edema in feet.

## 2011-03-23 LAB — URINE MICROSCOPIC-ADD ON

## 2011-03-23 LAB — URINALYSIS, ROUTINE W REFLEX MICROSCOPIC
Ketones, ur: NEGATIVE mg/dL
Leukocytes, UA: NEGATIVE
Protein, ur: NEGATIVE mg/dL
Urobilinogen, UA: 0.2 mg/dL (ref 0.0–1.0)

## 2011-03-23 LAB — TROPONIN I: Troponin I: <0.3 ng/mL (ref <0.30)

## 2011-03-23 LAB — BASIC METABOLIC PANEL
Chloride: 106 mEq/L (ref 96–112)
GFR calc Af Amer: 74 mL/min — ABNORMAL LOW (ref >90)
GFR calc non Af Amer: 64 mL/min — ABNORMAL LOW (ref >90)
Potassium: 3.6 mEq/L (ref 3.5–5.1)
Sodium: 140 mEq/L (ref 135–145)

## 2011-03-23 LAB — CBC
Platelets: 230 10*3/uL (ref 150–400)
RDW: 15 % (ref 11.5–15.5)
WBC: 10.7 10*3/uL — ABNORMAL HIGH (ref 4.0–10.5)

## 2011-03-23 LAB — RAPID URINE DRUG SCREEN, HOSP PERFORMED
Opiates: NOT DETECTED
Tetrahydrocannabinol: NOT DETECTED

## 2011-03-23 MED ORDER — METOPROLOL SUCCINATE ER 25 MG PO TB24: 25.0000 mg | ORAL_TABLET | Freq: Every day | ORAL | Status: DC

## 2011-03-23 MED ORDER — FUROSEMIDE 20 MG PO TABS: 20.0000 mg | ORAL_TABLET | Freq: Two times a day (BID) | ORAL | Status: DC

## 2011-03-23 NOTE — ED Provider Notes (Signed)
History     CSN: [id number] Arrival date & time: 03/22/2011 11:23 PM   First MD Initiated Contact with Patient 03/22/11 2325      Chief Complaint  Patient presents with  . Shortness of Breath  . Chest Pain    (Consider location/radiation/quality/duration/timing/severity/associated sxs/prior treatment) Patient is a 50 y.o. female presenting with chest pain. The history is provided by the patient and the EMS personnel.  Chest Pain Primary symptoms include shortness of breath.    the patient reports awakening this evening with shortness of breath and chest tightness.  She reports difficulty with her breathing and she went outside without improvement in her symptoms.  911 was called.  On EMS arrival they report the patient was hypertensive in the 240s over 100 and she was short of breath with chest tightness.  They report her oxygen saturation was around 90-92%.  She was given an aspirin nitroglycerin 40 of IV Lasix and placed on CPAP and brought to the ER emergently.  Arrival emergency department the patient was taken off of CPAP and reported she felt much better.  She denies chest pain at that time.  She denies orthopnea and PND.  She does report a history of hypertrophic obstructive cardiomyopathy for which she sees Dr. Jens Som for.  She reports a history of hypertension and currently has been out of her blood pressure medicines for several days.  She reports taking her lisinopril-HCTZ today but she has been unable to fill her metoprolol secondary to no refills available.  The patient denies nausea vomiting palpitations and diarrhea.  She denies melena and hematochezia.  Nothing worsens her symptoms.  Nothing improves her symptoms except for the therapies given by EMS.  At this time she is without complaint.  She was taken off all shown arrival to the emergency department with O2 saturations of 96-97%.    Past Medical History  Diagnosis Date  . Chest pain     cath 7/09: normal cors; EF 65%    . HTN (hypertension)   . Hyperlipidemia   . HOCM (hypertrophic obstructive cardiomyopathy)     echo 10/11: normal LVF; apical hypertropy; mod LAE; mild MR;      Holter 10/11: no NSVT;     ETT 10/11: no decreased BP with exercise     Past Surgical History  Procedure Date  . Tubal ligation   . Cardiac catheterization     Family History  Problem Relation Age of Onset  . Alcohol abuse Father   . Cirrhosis Father   . Asthma Brother   . Cancer Maternal Grandmother     cervical cancer    History  Substance Use Topics  . Smoking status: Never Smoker   . Smokeless tobacco: Never Used  . Alcohol Use: No    OB History    Grav Para Term Preterm Abortions TAB SAB Ect Mult Living                  Review of Systems  Respiratory: Positive for shortness of breath.   Cardiovascular: Positive for chest pain.  All other systems reviewed and are negative.    Allergies  Codeine phosphate  Home Medications   Current Outpatient Rx  Name Route Sig Dispense Refill  . ASPIRIN 81 MG PO TBEC Oral Take 81 mg by mouth daily.      . OMEGA-3 FATTY ACIDS 1000 MG PO CAPS Oral Take 2 g by mouth daily.      Marland Kitchen LISINOPRIL-HYDROCHLOROTHIAZIDE 20-25 MG PO  TABS  TAKE 1 TABLET BY MOUTH EVERY DAY 30 tablet 5  . METOPROLOL SUCCINATE 25 MG PO TB24 Oral Take 25 mg by mouth daily.      Marland Kitchen ONE-DAILY MULTI VITAMINS PO TABS Oral Take 1 tablet by mouth daily.        There were no vitals taken for this visit.  Physical Exam  Nursing note and vitals reviewed. Constitutional: She is oriented to person, place, and time. She appears well-developed and well-nourished. No distress.  HENT:  Head: Normocephalic and atraumatic.  Eyes: EOM are normal.  Neck: Normal range of motion.  Cardiovascular: Normal rate, regular rhythm and normal heart sounds.   Pulmonary/Chest: Effort normal and breath sounds normal. No respiratory distress. She has no wheezes. She has no rales.  Abdominal: Soft. She exhibits no  distension. There is no tenderness.  Musculoskeletal: Normal range of motion.  Neurological: She is alert and oriented to person, place, and time.  Skin: Skin is warm and dry.  Psychiatric: She has a normal mood and affect. Judgment normal.    ED Course  Procedures (including critical care time)  Labs Reviewed  CBC - Abnormal; Notable for the following:    WBC 10.7 (*)    All other components within normal limits  BASIC METABOLIC PANEL - Abnormal; Notable for the following:    Glucose, Bld 115 (*)    GFR calc non Af Amer 64 (*)    GFR calc Af Amer 74 (*)    All other components within normal limits  PRO B NATRIURETIC PEPTIDE - Abnormal; Notable for the following:    BNP, POC 1042.0 (*)    All other components within normal limits  URINALYSIS, ROUTINE W REFLEX MICROSCOPIC - Abnormal; Notable for the following:    Hgb urine dipstick SMALL (*)    All other components within normal limits  URINE MICROSCOPIC-ADD ON - Abnormal; Notable for the following:    Casts GRANULAR CAST (*)    All other components within normal limits  TROPONIN I  URINE RAPID DRUG SCREEN (HOSP PERFORMED)   Dg Chest Portable 1 View  03/22/2011  *RADIOLOGY REPORT*  Clinical Data: Shortness of breath and chest pain.  PORTABLE CHEST - 1 VIEW  Comparison: Chest radiograph performed 05/21/2007  Findings: The lungs are well-aerated.  Mild perihilar and bibasilar airspace opacification raises question for mild interstitial edema, given mild underlying vascular congestion.  Alternately, pneumonia might have a similar appearance.  There is no evidence of pleural effusion or pneumothorax.  The cardiomediastinal silhouette is borderline enlarged, more prominent than on the prior study.  No acute osseous abnormalities are seen.  IMPRESSION: Mild perihilar and bibasilar airspace opacification raises concern for mild interstitial edema, given mild underlying vascular congestion and borderline cardiomegaly.  Alternatively,  pneumonia might have a similar appearance.  Original Report Authenticated By: Tonia Ghent, M.D.     1. Dyspnea   2. Hypertension       MDM  The patient was seen and evaluated upon arrival.  She appears much better than what EMS describes.  Her chest x-ray shows interstitial edema however her pulmonary exam at this time is fairly unremarkable.  My suspicion for pulmonary embolism is very low.  I suspect the patient may have had hypertensive urgency/emergency given her hypertension on EMS arrival.  I suspect that the Lasix and nitroglycerin given by EMS as well as support with CPAP resolved the patient's symptoms.  At this time her blood pressure is controlled  2:53 AM  She feels much better at this time.  She was ambulated around the emergency department without any difficulty.  She has urinated several times and a bedside commode and appears to put out a lot of urine.  Her BNP is elevated as well.  Because the patient follow up closely with cardiology.  The patient will be placed on several days of Lasix and she is to call Dr. Ludwig Clarks office in the morning.       Lyanne Co, MD 03/23/11 [phone]

## 2011-04-04 ENCOUNTER — Encounter: Payer: Self-pay | Admitting: Cardiology

## 2011-04-04 ENCOUNTER — Ambulatory Visit (INDEPENDENT_AMBULATORY_CARE_PROVIDER_SITE_OTHER): Payer: 59 | Admitting: Cardiology

## 2011-04-04 DIAGNOSIS — I509 Heart failure, unspecified: Secondary | ICD-10-CM

## 2011-04-04 DIAGNOSIS — I5032 Chronic diastolic (congestive) heart failure: Secondary | ICD-10-CM | POA: Insufficient documentation

## 2011-04-04 MED ORDER — FUROSEMIDE 20 MG PO TABS: ORAL_TABLET | ORAL | Status: DC

## 2011-04-04 NOTE — Progress Notes (Signed)
HPI: Pleasant female with a h/o normal cardiac catheterization in 2009 other than apical hypertrophy for fu. Echo in October of 2011. This revealed normal LV function and apical hypertrophy. There was moderate left atrial enlargement and mild mitral regurgitation. A Holter monitor showed no nonsustained ventricular tachycardia. An exercise treadmill revealed no decrease in systolic blood pressure with exercise. The patient seen in the emergency room in late November with dyspnea. Her blood pressure was elevated and she had run out of her medications. Her chest x-ray showed interstitial edema and her BNP was elevated. She was treated with diuretics. Since then, she is improved but still notes mild dyspnea on exertion. No orthopnea or PND but she has noticed minimal edema. No exertional chest pain or syncope.   Current Outpatient Prescriptions  Medication Sig Dispense Refill  . aspirin 81 MG EC tablet Take 81 mg by mouth daily.        . fish oil-omega-3 fatty acids 1000 MG capsule Take 2 g by mouth daily.        Marland Kitchen lisinopril-hydrochlorothiazide (PRINZIDE,ZESTORETIC) 20-25 MG per tablet TAKE 1 TABLET BY MOUTH EVERY DAY  30 tablet  5  . metoprolol succinate (TOPROL-XL) 25 MG 24 hr tablet Take 1 tablet (25 mg total) by mouth daily.  30 tablet  0  . Multiple Vitamin (MULTIVITAMIN) tablet Take 1 tablet by mouth daily.        Marland Kitchen DISCONTD: metoprolol (TOPROL-XL) 25 MG 24 hr tablet Take 25 mg by mouth daily.           Past Medical History  Diagnosis Date  . Chest pain     cath 7/09: normal cors; EF 65%  . HTN (hypertension)   . Hyperlipidemia   . HOCM (hypertrophic obstructive cardiomyopathy)     echo 10/11: normal LVF; apical hypertropy; mod LAE; mild MR;      Holter 10/11: no NSVT;     ETT 10/11: no decreased BP with exercise     Past Surgical History  Procedure Date  . Tubal ligation   . Cardiac catheterization     History   Social History  . Marital Status: Married    Spouse Name: N/A   Number of Children: N/A  . Years of Education: N/A   Occupational History  . Not on file.   Social History Main Topics  . Smoking status: Never Smoker   . Smokeless tobacco: Never Used  . Alcohol Use: No  . Drug Use: No  . Sexually Active: Yes    Birth Control/ Protection: None   Other Topics Concern  . 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  Is planning on adopting 3 foster children (2012)    ROS: no fevers or chills, productive cough, hemoptysis, dysphasia, odynophagia, melena, hematochezia, dysuria, hematuria, rash, seizure activity, orthopnea, PND, pedal edema, claudication. Remaining systems are negative.  Physical Exam: Well-developed well-nourished in no acute distress.  Skin is warm and dry.  HEENT is normal.  Neck is supple. No thyromegaly.  Chest is clear to auscultation with normal expansion.  Cardiovascular exam is regular rate and rhythm. 2/6 systolic murmur Abdominal exam nontender or distended. No masses palpated. Extremities show no edema. neuro grossly intact

## 2011-04-04 NOTE — Patient Instructions (Signed)
Your physician recommends that you schedule a follow-up appointment in: 6-8 WEEKS  Your physician has requested that you have an echocardiogram. Echocardiography is a painless test that uses sound waves to create images of your heart. It provides your doctor with information about the size and shape of your heart and how well your heart's chambers and valves are working. This procedure takes approximately one hour. There are no restrictions for this procedure.  START FUROSEMIDE 20 MG ONCE DAILY AS NEEDED FOR SHORTNESS OF BREATH OR WEIGHT GAIN  Your physician recommends that you return for lab work in: 2 WEEKS

## 2011-04-04 NOTE — Assessment & Plan Note (Signed)
Blood pressure is controlled. Continue present medications. 

## 2011-04-04 NOTE — Assessment & Plan Note (Signed)
Continue beta blocker. No risk factors for sudden death.

## 2011-04-04 NOTE — Assessment & Plan Note (Signed)
Patient had an episode of pulmonary edema. She had run out of her medications for 2 days and her blood pressure was elevated which may have contributed. She continues to have mild dyspnea. I will have Lasix 20 mg daily as needed for increased shortness of breath, edema or weight gain at 2-3 pounds. Repeat echocardiogram. Check potassium, renal function and BNP in 2 weeks.

## 2011-04-19 ENCOUNTER — Ambulatory Visit (HOSPITAL_COMMUNITY): Payer: 59 | Attending: Cardiovascular Disease

## 2011-04-19 ENCOUNTER — Other Ambulatory Visit (INDEPENDENT_AMBULATORY_CARE_PROVIDER_SITE_OTHER): Payer: 59 | Admitting: *Deleted

## 2011-04-19 DIAGNOSIS — I5032 Chronic diastolic (congestive) heart failure: Secondary | ICD-10-CM

## 2011-04-19 DIAGNOSIS — R079 Chest pain, unspecified: Secondary | ICD-10-CM | POA: Insufficient documentation

## 2011-04-19 DIAGNOSIS — R0609 Other forms of dyspnea: Secondary | ICD-10-CM | POA: Insufficient documentation

## 2011-04-19 DIAGNOSIS — R0989 Other specified symptoms and signs involving the circulatory and respiratory systems: Secondary | ICD-10-CM | POA: Insufficient documentation

## 2011-04-19 DIAGNOSIS — I10 Essential (primary) hypertension: Secondary | ICD-10-CM | POA: Insufficient documentation

## 2011-04-19 DIAGNOSIS — I509 Heart failure, unspecified: Secondary | ICD-10-CM

## 2011-04-19 DIAGNOSIS — E785 Hyperlipidemia, unspecified: Secondary | ICD-10-CM | POA: Insufficient documentation

## 2011-04-19 DIAGNOSIS — I079 Rheumatic tricuspid valve disease, unspecified: Secondary | ICD-10-CM | POA: Insufficient documentation

## 2011-04-19 DIAGNOSIS — I059 Rheumatic mitral valve disease, unspecified: Secondary | ICD-10-CM | POA: Insufficient documentation

## 2011-04-19 LAB — BASIC METABOLIC PANEL
CO2: 30 mEq/L (ref 19–32)
Calcium: 9 mg/dL (ref 8.4–10.5)
Creatinine, Ser: 1.1 mg/dL (ref 0.4–1.2)
GFR: 65.89 mL/min (ref >60.00)
Glucose, Bld: 71 mg/dL (ref 70–99)
Sodium: 138 mEq/L (ref 135–145)

## 2011-04-19 LAB — BRAIN NATRIURETIC PEPTIDE: Pro B Natriuretic peptide (BNP): 205 pg/mL — ABNORMAL HIGH (ref 0.0–100.0)

## 2011-05-17 ENCOUNTER — Encounter: Payer: Self-pay | Admitting: Cardiology

## 2011-05-17 ENCOUNTER — Ambulatory Visit (INDEPENDENT_AMBULATORY_CARE_PROVIDER_SITE_OTHER): Payer: 59 | Admitting: Cardiology

## 2011-05-17 VITALS — BP 114/62 | HR 72 | Ht 65.0 in | Wt 196.0 lb

## 2011-05-17 DIAGNOSIS — I509 Heart failure, unspecified: Secondary | ICD-10-CM

## 2011-05-17 DIAGNOSIS — I10 Essential (primary) hypertension: Secondary | ICD-10-CM

## 2011-05-17 DIAGNOSIS — I421 Obstructive hypertrophic cardiomyopathy: Secondary | ICD-10-CM

## 2011-05-17 LAB — BASIC METABOLIC PANEL
BUN: 15 mg/dL (ref 6–23)
Chloride: 101 mEq/L (ref 96–112)
Creatinine, Ser: 1.2 mg/dL (ref 0.4–1.2)
GFR: 63.9 mL/min (ref >60.00)

## 2011-05-17 MED ORDER — METOPROLOL SUCCINATE ER 25 MG PO TB24: 25.0000 mg | ORAL_TABLET | Freq: Every day | ORAL | Status: DC

## 2011-05-17 NOTE — Assessment & Plan Note (Signed)
Continue beta blocker. 

## 2011-05-17 NOTE — Patient Instructions (Signed)
Your physician wants you to follow-up in:  6 months. You will receive a reminder letter in the mail two months in advance. If you don't receive a letter, please call our office to schedule the follow-up appointment.   

## 2011-05-17 NOTE — Assessment & Plan Note (Signed)
Most likely from diastolic dysfunction. Euvolemic on examination and symptoms much improved. Continue present dose of Lasix. Check potassium and renal function.

## 2011-05-17 NOTE — Assessment & Plan Note (Signed)
Blood pressure controlled. Continue present medications. 

## 2011-05-17 NOTE — Progress Notes (Signed)
   RUE:[id number] female with a h/o normal cardiac catheterization in 2009 other than apical hypertrophy for fu. Last echo in Dec 2012 showed EF 65-70, mild mitral regurgitation, moderately elevated pulmonary pressures. A Holter monitor showed no nonsustained ventricular tachycardia. An exercise treadmill revealed no decrease in systolic blood pressure with exercise. The patient seen in the emergency room in late November 2012 with dyspnea. Her blood pressure was elevated and she had run out of her medications. Her chest x-ray showed interstitial edema and her BNP was elevated. She was treated with diuretics. I last saw her in Dec 2012. Since then, the patient denies any dyspnea on exertion, orthopnea, PND, pedal edema, palpitations, syncope or chest pain.    Current Outpatient Prescriptions  Medication Sig Dispense Refill  . aspirin 81 MG EC tablet Take 81 mg by mouth daily.        . fish oil-omega-3 fatty acids 1000 MG capsule Take 2 g by mouth daily.        . furosemide (LASIX) 20 MG tablet One tablet daily as needed  30 tablet  11  . lisinopril-hydrochlorothiazide (PRINZIDE,ZESTORETIC) 20-25 MG per tablet TAKE 1 TABLET BY MOUTH EVERY DAY  30 tablet  5  . metoprolol succinate (TOPROL-XL) 25 MG 24 hr tablet Take 1 tablet (25 mg total) by mouth daily.  30 tablet  0  . Multiple Vitamin (MULTIVITAMIN) tablet Take 1 tablet by mouth daily.           Past Medical History  Diagnosis Date  . Chest pain     cath 7/09: normal cors; EF 65%  . HTN (hypertension)   . Hyperlipidemia   . HOCM (hypertrophic obstructive cardiomyopathy)     echo 10/11: normal LVF; apical hypertropy; mod LAE; mild MR;      Holter 10/11: no NSVT;     ETT 10/11: no decreased BP with exercise     Past Surgical History  Procedure Date  . Tubal ligation   . Cardiac catheterization     History   Social History  . Marital Status: Married    Spouse Name: N/A    Number of Children: N/A  . Years of Education: N/A    Occupational History  . Not on file.   Social History Main Topics  . Smoking status: Never Smoker   . Smokeless tobacco: Never Used  . Alcohol Use: No  . Drug Use: No  . Sexually Active: Yes    Birth Control/ Protection: None   Other Topics Concern  . 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  Is planning on adopting 3 foster children (2012)    ROS: Some left leg pain but no fevers or chills, productive cough, hemoptysis, dysphasia, odynophagia, melena, hematochezia, dysuria, hematuria, rash, seizure activity, orthopnea, PND, pedal edema, claudication. Remaining systems are negative.  Physical Exam: Well-developed well-nourished in no acute distress.  Skin is warm and dry.  HEENT is normal.  Neck is supple. No thyromegaly.  Chest is clear to auscultation with normal expansion.  Cardiovascular exam is regular rate and rhythm. s4 Abdominal exam nontender or distended. No masses palpated. Extremities show no edema. neuro grossly intact

## 2011-05-22 ENCOUNTER — Encounter: Payer: Self-pay | Admitting: *Deleted

## 2011-06-14 ENCOUNTER — Other Ambulatory Visit: Payer: Self-pay | Admitting: Obstetrics and Gynecology

## 2011-06-14 DIAGNOSIS — Z1231 Encounter for screening mammogram for malignant neoplasm of breast: Secondary | ICD-10-CM

## 2011-06-23 ENCOUNTER — Other Ambulatory Visit: Payer: Self-pay | Admitting: Family Medicine

## 2011-06-23 NOTE — Telephone Encounter (Signed)
Refill request

## 2011-08-08 ENCOUNTER — Ambulatory Visit
Admission: RE | Admit: 2011-08-08 | Discharge: 2011-08-08 | Disposition: A | Discharge status: Home / Self Care | Payer: 59 | Source: Ambulatory Visit | Attending: Obstetrics and Gynecology | Admitting: Obstetrics and Gynecology

## 2011-08-08 DIAGNOSIS — Z1231 Encounter for screening mammogram for malignant neoplasm of breast: Secondary | ICD-10-CM

## 2011-08-14 ENCOUNTER — Telehealth: Payer: Self-pay | Admitting: Cardiology

## 2011-08-14 NOTE — Telephone Encounter (Signed)
Fu call °Pt returning your call  °

## 2011-08-14 NOTE — Telephone Encounter (Signed)
New msg Pt said she has been retaining a lot of fluid. Please call her back

## 2011-08-14 NOTE — Telephone Encounter (Signed)
Pt calling with increased sob and increased bil lower extremity swelling.  She states she is taking her lasix.  Appt scheduled with Tereso Newcomer PA-C for tomorrow.  Pt states she is unable to come in today.

## 2011-08-14 NOTE — Telephone Encounter (Signed)
lmtcb

## 2011-08-15 ENCOUNTER — Ambulatory Visit (INDEPENDENT_AMBULATORY_CARE_PROVIDER_SITE_OTHER): Payer: 59 | Admitting: Physician Assistant

## 2011-08-15 ENCOUNTER — Encounter: Payer: Self-pay | Admitting: Physician Assistant

## 2011-08-15 ENCOUNTER — Ambulatory Visit (INDEPENDENT_AMBULATORY_CARE_PROVIDER_SITE_OTHER)
Admission: RE | Admit: 2011-08-15 | Discharge: 2011-08-15 | Disposition: A | Discharge status: Home / Self Care | Payer: 59 | Source: Ambulatory Visit | Attending: Cardiology | Admitting: Cardiology

## 2011-08-15 ENCOUNTER — Ambulatory Visit: Payer: 59 | Admitting: Physician Assistant

## 2011-08-15 VITALS — BP 117/69 | HR 71 | Ht 65.0 in | Wt 185.0 lb

## 2011-08-15 DIAGNOSIS — R0602 Shortness of breath: Secondary | ICD-10-CM

## 2011-08-15 DIAGNOSIS — R079 Chest pain, unspecified: Secondary | ICD-10-CM

## 2011-08-15 LAB — BASIC METABOLIC PANEL
Calcium: 9.9 mg/dL (ref 8.4–10.5)
GFR: 67.17 mL/min (ref >60.00)
Potassium: 3.1 mEq/L — ABNORMAL LOW (ref 3.5–5.1)
Sodium: 136 mEq/L (ref 135–145)

## 2011-08-15 LAB — BRAIN NATRIURETIC PEPTIDE: Pro B Natriuretic peptide (BNP): 321 pg/mL — ABNORMAL HIGH (ref 0.0–100.0)

## 2011-08-15 MED ORDER — FUROSEMIDE 20 MG PO TABS: ORAL_TABLET | ORAL | Status: DC

## 2011-08-15 NOTE — Patient Instructions (Signed)
Your physician recommends that you schedule a follow-up appointment in: 2 WEEKS WITH DR. CRENSHAW, IF NOT AVAILABLE THEN OK WITH SCOTT WEAVER, PAC SAME DAY DR. Jens Som IS IN THE OFFICE.  Your physician recommends that you return for lab work in: TODAY BMET, BNP  Your physician recommends that you return for lab work in: 1 WEEK REPEAT BMET  A chest x-ray DX 786.05, THIS IS TO BE DONE @ [facility] ON [address]OSS FROM [facility] takes a picture of the organs and structures inside the chest, including the heart, lungs, and blood vessels. This test can show several things, including, whether the heart is enlarges; whether fluid is building up in the lungs; and whether pacemaker / defibrillator leads are still in place.  Your physician has recommended you make the following change in your medication: INCREASE LASIX TO 40 MG DAILY FOR 3 DAYS THEN DECREASE TO 20 MG DAILY, INCREASE DIETARY POTASSIUM FOR 3 DAYS ONLY  YOU HAVE BEEN GIVEN A 4 GRAM SODIUM DIET TO FOLLOW

## 2011-08-15 NOTE — Progress Notes (Signed)
[address] [place], [place]  [address] Phone: [phone] Fax:  [phone]  Date:  08/15/2011   Name:  [name]       DOB:  12-27-1960 MRN:  [id number]  PCP:  Delbert Harness, MD, MD  Primary Cardiologist:  Dr. Olga Millers  Primary Electrophysiologist:  None    History of Present Illness: [name] is a 51 y.o. female who presents for evaluation of edema and dyspnea.    She has a h/o normal cardiac catheterization in 2009 other than apical hypertrophy. Of note, a myoview prior to [facility] then was abnormal with EF 44%, mild mid and apical anterior wall ischemia. Dr. Jens Som had her do an echocardiogram which was performed in October of 2011. This revealed normal LV function and apical hypertrophy. There was moderate left atrial enlargement and mild mitral regurgitation. A Holter monitor showed no nonsustained ventricular tachycardia. An exercise treadmill revealed no decrease in systolic blood pressure with exercise.     2-D echocardiogram 04/19/11: Study Conclusions  - Left ventricle: Wall thickness was increased in a pattern of severe LVH. Systolic function was vigorous. The estimated ejection fraction was in the range of 65% to 70%. - Mitral valve: Mild regurgitation. - Right ventricle: Hypertrophy was present. - Pulmonary arteries: Systolic pressure was moderately increased. PA peak pressure: 50mm Hg (S).   Lab Results  Component Value Date   CREATININE 1.2 05/17/2011   BUN 15 05/17/2011   NA 138 05/17/2011   K 3.5 05/17/2011   CL 101 05/17/2011   CO2 29 05/17/2011     She presents today with complaints of increased dyspnea and edema over the last 2 weeks.  She describes class 2-2b symptoms.  Now sleeping on 4 pillows.  No PND.  Has some left chest pressure that is constant and worse with leaning forward. No syncope.  She takes Lasix prn.  Has taken daily for a week.  Took 2 tabs today due to increased edema.  Notes sudden increase in  edema and dyspnea with high salt meals.  She notes she has tried to limit salt as much as possible.    Past Medical History  Diagnosis Date  . Chest pain     cath 7/09: normal cors; EF 65%  . HTN (hypertension)   . Hyperlipidemia   . HOCM (hypertrophic obstructive cardiomyopathy)     echo 10/11: normal LVF; apical hypertropy; mod LAE; mild MR;      Holter 10/11: no NSVT;     ETT 10/11: no decreased BP with exercise     Current Outpatient Prescriptions  Medication Sig Dispense Refill  . aspirin 81 MG EC tablet Take 81 mg by mouth daily.        . fish oil-omega-3 fatty acids 1000 MG capsule Take 2 g by mouth daily.        . furosemide (LASIX) 20 MG tablet One tablet daily as needed  30 tablet  11  . lisinopril-hydrochlorothiazide (PRINZIDE,ZESTORETIC) 20-25 MG per tablet TAKE 1 TABLET BY MOUTH EVERY DAY  30 tablet  5  . metoprolol succinate (TOPROL-XL) 25 MG 24 hr tablet Take 1 tablet (25 mg total) by mouth daily.  90 tablet  4  . Multiple Vitamin (MULTIVITAMIN) tablet Take 1 tablet by mouth daily.          Allergies: Allergies  Allergen Reactions  . Codeine Phosphate Rash    REACTION: unspecified    History  Substance Use Topics  .  Smoking status: Never Smoker   . Smokeless tobacco: Never Used  . Alcohol Use: No     ROS:  Please see the history of present illness.   Has a non-prod cough she attributes to allergies.  Weights at home range 190-200.  Menses started recently.    All other systems reviewed and negative.   PHYSICAL EXAM: VS:  BP 117/69  Pulse 71  Ht 5\' 5"  (1.651 m)  Wt 185 lb (83.915 kg)  BMI 30.79 kg/m2  LMP 07/20/2011 Well nourished, well developed, in no acute distress HEENT: normal Neck: minimal JVD Cardiac:  normal S1, S2; RRR; 2/6 systolic murmur along LSB Lungs:  clear to auscultation bilaterally, no wheezing, rhonchi or rales Abd: soft, nontender, no hepatomegaly Ext: no edema Skin: warm and dry Neuro:  CNs 2-12 intact, no focal abnormalities  noted  Wt Readings from Last 3 Encounters:  08/15/11 185 lb (83.915 kg)  05/17/11 196 lb (88.905 kg)  04/04/11 193 lb (87.544 kg)    EKG:  NSR, HR 71, LVH with repol abnormality  ASSESSMENT AND PLAN:  1.  Dyspnea She has a h/o diastolic CHF.  Her symptoms seem c/w this.  She does not appear to be terribly volume overloaded on exam.  I will give her low Na diet info.  Increase Lasix to 40 mg QD x 3 days, then Lasix 20 mg QD.  Check bmet, bnp and CXR today.  Repeat bmet and bnp next week.  Follow up in 2 weeks with Dr. Olga Millers or me.    2.  Chronic Diastolic CHF Suspect she has some volume overload with her symptoms.  Adjust Lasix as noted with close follow up.  3.  HOCM Recent echo in 03/2011.  At this point, no need to repeat.  4.  Chest Pain Likely related to volume overload.  Atypical symptoms.  Normal cors by cath in 2009.  Adjust lasix as noted and follow up in 2 weeks.    Alyssa Glasgow, PA-C  11:28 AM 08/15/2011

## 2011-08-16 ENCOUNTER — Telehealth: Payer: Self-pay | Admitting: Physician Assistant

## 2011-08-16 NOTE — Telephone Encounter (Signed)
New msg Pt was here yesterday and wanted to know chest xray results

## 2011-08-16 NOTE — Telephone Encounter (Signed)
pt notified of cxr results normal , gave me verbal understanding today. [name]  

## 2011-08-16 NOTE — Telephone Encounter (Signed)
pt notified of cxr results normal , gave me verbal understanding today. Danielle Rankin

## 2011-08-16 NOTE — Telephone Encounter (Signed)
CXR ok Results did not come to me Thank you for notifying patient Tereso Newcomer, PA-C  4:46 PM 08/16/2011

## 2011-08-16 NOTE — Telephone Encounter (Signed)
Pt already notified of cxr results

## 2011-08-18 ENCOUNTER — Telehealth: Payer: Self-pay | Admitting: Cardiology

## 2011-08-18 DIAGNOSIS — E876 Hypokalemia: Secondary | ICD-10-CM

## 2011-08-18 MED ORDER — POTASSIUM CHLORIDE CRYS ER 20 MEQ PO TBCR: EXTENDED_RELEASE_TABLET | ORAL | Status: DC

## 2011-08-18 NOTE — Telephone Encounter (Signed)
New Problem:     Patient called in returning Clayborne Dana phone call from 08/16/11 regarding her CXR results.  Please call back.

## 2011-08-18 NOTE — Telephone Encounter (Signed)
Message copied by Freddi Starr on Fri Aug 18, 2011  4:06 PM ------      Message from: Lewayne Bunting      Created: Tue Aug 15, 2011  5:44 PM       kcl 40 meq po x 1, then 20 meq po daily; bmet one week      Olga Millers

## 2011-08-29 ENCOUNTER — Other Ambulatory Visit: Payer: 59

## 2011-12-21 ENCOUNTER — Encounter (HOSPITAL_COMMUNITY): Payer: Self-pay | Admitting: Adult Health

## 2011-12-21 ENCOUNTER — Emergency Department (HOSPITAL_COMMUNITY)
Admission: EM | Admit: 2011-12-21 | Discharge: 2011-12-22 | Disposition: A | Discharge status: Home / Self Care | Payer: 59 | Attending: Emergency Medicine | Admitting: Emergency Medicine

## 2011-12-21 ENCOUNTER — Emergency Department (HOSPITAL_COMMUNITY): Payer: 59

## 2011-12-21 DIAGNOSIS — I421 Obstructive hypertrophic cardiomyopathy: Secondary | ICD-10-CM

## 2011-12-21 DIAGNOSIS — M545 Low back pain, unspecified: Secondary | ICD-10-CM | POA: Insufficient documentation

## 2011-12-21 DIAGNOSIS — R0600 Dyspnea, unspecified: Secondary | ICD-10-CM

## 2011-12-21 DIAGNOSIS — I10 Essential (primary) hypertension: Secondary | ICD-10-CM | POA: Insufficient documentation

## 2011-12-21 DIAGNOSIS — R0989 Other specified symptoms and signs involving the circulatory and respiratory systems: Secondary | ICD-10-CM | POA: Insufficient documentation

## 2011-12-21 DIAGNOSIS — R0609 Other forms of dyspnea: Secondary | ICD-10-CM | POA: Insufficient documentation

## 2011-12-21 DIAGNOSIS — E785 Hyperlipidemia, unspecified: Secondary | ICD-10-CM | POA: Insufficient documentation

## 2011-12-21 LAB — BASIC METABOLIC PANEL
CO2: 28 mEq/L (ref 19–32)
Calcium: 9.5 mg/dL (ref 8.4–10.5)
Chloride: 96 mEq/L (ref 96–112)
Glucose, Bld: 105 mg/dL — ABNORMAL HIGH (ref 70–99)
Potassium: 3.7 mEq/L (ref 3.5–5.1)
Sodium: 134 mEq/L — ABNORMAL LOW (ref 135–145)

## 2011-12-21 LAB — CBC
Hemoglobin: 13.2 g/dL (ref 12.0–15.0)
Platelets: 236 10*3/uL (ref 150–400)
RBC: 4.86 MIL/uL (ref 3.87–5.11)
WBC: 11.8 10*3/uL — ABNORMAL HIGH (ref 4.0–10.5)

## 2011-12-21 LAB — TROPONIN I: Troponin I: <0.3 ng/mL (ref <0.30)

## 2011-12-21 LAB — POCT I-STAT TROPONIN I: Troponin i, poc: 0.05 ng/mL (ref 0.00–0.08)

## 2011-12-21 LAB — PRO B NATRIURETIC PEPTIDE: Pro B Natriuretic peptide (BNP): 1158 pg/mL — ABNORMAL HIGH (ref 0–125)

## 2011-12-21 MED ORDER — IBUPROFEN 200 MG PO TABS
400.0000 mg | ORAL_TABLET | Freq: Once | ORAL | Status: AC
  Administered 2011-12-21: 400 mg via ORAL
  Filled 2011-12-21: qty 2

## 2011-12-21 MED ORDER — ASPIRIN 325 MG PO TABS
325.0000 mg | ORAL_TABLET | ORAL | Status: AC
  Administered 2011-12-21: 325 mg via ORAL
  Filled 2011-12-21: qty 1

## 2011-12-21 MED ORDER — OXYCODONE-ACETAMINOPHEN 5-325 MG PO TABS
2.0000 | ORAL_TABLET | Freq: Once | ORAL | Status: AC
  Administered 2011-12-21: 2 via ORAL
  Filled 2011-12-21: qty 2

## 2011-12-21 MED ORDER — NITROGLYCERIN 0.4 MG SL SUBL: 0.4000 mg | SUBLINGUAL_TABLET | SUBLINGUAL | Status: DC | PRN

## 2011-12-21 NOTE — ED Provider Notes (Addendum)
History     CSN: [id number]  Arrival date & time 12/21/11  2224   First MD Initiated Contact with Patient 12/21/11 2241      Chief Complaint  Patient presents with  . Shortness of Breath    (Consider location/radiation/quality/duration/timing/severity/associated sxs/prior treatment) HPI [name] is a 51 y.o. female with a PMH significant for HOCM who awoke from sleep last night with a back ache 7/10, sharp, paraspinous muscles of the lumbar region, acute on chronic, constant, worse with position, concerned because back hurt last time she had CHF exacerbation and required admission with BiPap.  Pt is not in respiratory distress.  She says there is associated dyspnea, no chest pain, no productive cough, no cough, no syncope, no hemoptysis, nausea, vomiting, diarrhea, or abdominal pain.  No new medications, has taken all meds, no changes in diet.  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  Dr. Jens Som is cardiologist.  Past Medical History  Diagnosis Date  . Chest pain     cath 7/09: normal cors; EF 65%  . HTN (hypertension)   . Hyperlipidemia   . HOCM (hypertrophic obstructive cardiomyopathy)     echo 10/11: normal LVF; apical hypertropy; mod LAE; mild MR;      Holter 10/11: no NSVT;     ETT 10/11: no decreased BP with exercise     Past Surgical History  Procedure Date  . Tubal ligation   . Cardiac catheterization     Family History  Problem Relation Age of Onset  . Alcohol abuse Father   . Cirrhosis Father   . Asthma Brother   . Cancer Maternal Grandmother     cervical cancer    History  Substance Use Topics  . Smoking status: Never Smoker   . Smokeless tobacco: Never Used  . Alcohol Use: No    OB History    Grav Para Term Preterm Abortions TAB SAB Ect Mult Living                  Review of SystemsPositive for low back pain, dyspnea; Patient denies any fevers or chills, changes in vision, earache, sore throat, neck pain or stiffness, chest pain or pressure,  palpitations, syncope, cough, wheezing,  abdominal pain, nausea, vomiting, diarrhea, melena, red bloody stools, frequency, dysuria, myalgias, arthralgias, recent trauma, rash, itching, skin lesions, easy bruising or bleeding, headache, seizures, numbness, tingling or weakness.    Allergies  Codeine phosphate  Home Medications   Current Outpatient Rx  Name Route Sig Dispense Refill  . ASPIRIN 81 MG PO TBEC Oral Take 81 mg by mouth daily.      . OMEGA-3 FATTY ACIDS 1000 MG PO CAPS Oral Take 2 g by mouth daily.      . FUROSEMIDE 20 MG PO TABS Oral Take 20 mg by mouth daily.    Marland Kitchen LISINOPRIL-HYDROCHLOROTHIAZIDE 20-25 MG PO TABS Oral Take 1 tablet by mouth daily.    Marland Kitchen METOPROLOL SUCCINATE ER 25 MG PO TB24 Oral Take 25 mg by mouth daily.    Marland Kitchen ONE-DAILY MULTI VITAMINS PO TABS Oral Take 1 tablet by mouth daily.      Marland Kitchen POTASSIUM CHLORIDE CRYS ER 20 MEQ PO TBCR Oral Take 20 mEq by mouth daily.      BP 144/75  Pulse 63  Temp 97.9 F (36.6 C)  Resp 16  SpO2 99%  LMP 12/13/2011  Physical Exam VITAL SIGNS:   Filed Vitals:   12/21/11 2230  BP: 144/75  Pulse: 63  Temp: 97.9  F (36.6 C)  Resp: 16   CONSTITUTIONAL: Awake, oriented, appears non-toxic HENT: Atraumatic, normocephalic, oral mucosa pink and moist, airway patent. Nares patent without drainage. External ears normal. EYES: Conjunctiva clear, EOMI, PERRLA NECK: Trachea midline, non-tender, supple CARDIOVASCULAR: Normal heart rate, Normal rhythm, systolic murmur at left upper sternal border PULMONARY/CHEST: Clear to auscultation, no rhonchi, wheezes, or rales. Symmetrical breath sounds. Non-tender. ABDOMINAL: Non-distended, soft, non-tender - no rebound or guarding.  BS normal. BACK: mild TTP in the paraspinous muscles of lumbar spine.  Palpable muscle spasm on left lumber PS muscle. NEUROLOGIC: Non-focal, moving all four extremities, no gross sensory or motor deficits. EXTREMITIES: No clubbing, cyanosis, or edema SKIN: Warm, Dry,  No erythema, No rash  ED Course  Procedures (including critical care time)  Date: 01/15/2012  Rate: normal  Rhythm: normal sinus rhythm  QRS Axis: normal  Intervals: normal  ST/T Wave abnormalities: inverted T-waves I-III, aVL, aVF, V3-V6  Conduction Disutrbances: none  Narrative Interpretation: LVH, no change from prior EKG 07/2011  Labs Reviewed  CBC - Abnormal; Notable for the following:    WBC 11.8 (*)     All other components within normal limits  BASIC METABOLIC PANEL - Abnormal; Notable for the following:    Sodium 134 (*)     Glucose, Bld 105 (*)     GFR calc non Af Amer 63 (*)     GFR calc Af Amer 73 (*)     All other components within normal limits  PRO B NATRIURETIC PEPTIDE - Abnormal; Notable for the following:    Pro B Natriuretic peptide (BNP) 1158.0 (*)     All other components within normal limits  TROPONIN I  POCT I-STAT TROPONIN I  LAB REPORT - SCANNED   No results found.   1. Dyspnea   2. Low back pain   3. HOCM (hypertrophic obstructive cardiomyopathy)       MDM  [name] is a 51 y.o. female with HOCM presenting with back pain and dyspnea.  Her lungs are clear on exam and by CXR. She is not in respiratory distress, is saturating >95% on room air. Pt is PERC negative and my overall suspicion for PE is very low.  Doubt infection as she is non-toxic, afebrile with a clear CXR.  Pt low back mildly TTP on exam, spasm and pain relieved with pain medicine and valium for spasm on left.  Pt ambulated around the ER and was not feeling dyspneic, saturations were normal.  Discussed follow up with Dr. Jens Som and return to ED if symptoms return.  I explained the diagnosis in detail and have given ER return precautions including chest pain, return of shortness of breath, hemoptysis, or any other new or worsening symptoms. The patient understands and accepts the medical plan as it's been dictated and I have answered all questions. Discharge instructions  concerning home care and prescriptions have been given.  The patient is STABLE and is discharged to home in good condition.    Jones Skene, MD 12/24/11 1936  Jones Skene, MD 01/15/12 1610

## 2011-12-21 NOTE — ED Notes (Addendum)
Reports waking from sleep with SOB and bilateral mid back pain. SOB woke her from sleep and began approx 30 minutes ago. Back pain feels like, "A bad back ache" and is worse with deep breaths.  Denies Chest pain and diaphoresis.  Able to speak in full sentences, no accessory muscle use, HX of HOCM and CHF

## 2011-12-22 MED ORDER — OXYCODONE-ACETAMINOPHEN 5-325 MG PO TABS: 1.0000 | ORAL_TABLET | Freq: Four times a day (QID) | ORAL | Status: AC | PRN

## 2011-12-22 NOTE — ED Notes (Signed)
Ambulated pt in hall with portable pulse. Oxygen remained 100% and pt had no complaints with ambulation.

## 2012-01-09 ENCOUNTER — Telehealth: Payer: Self-pay | Admitting: Cardiology

## 2012-01-09 NOTE — Telephone Encounter (Signed)
New Problem:     I called the patient and was unable to reach them. I left a message on their voicemail with my name, the reason I called, the name of his physician, and a number to call back to schedule their appointment. 

## 2012-01-30 ENCOUNTER — Other Ambulatory Visit: Payer: Self-pay | Admitting: Family Medicine

## 2012-01-31 ENCOUNTER — Ambulatory Visit (INDEPENDENT_AMBULATORY_CARE_PROVIDER_SITE_OTHER): Payer: 59 | Admitting: Cardiology

## 2012-01-31 ENCOUNTER — Encounter: Payer: Self-pay | Admitting: Cardiology

## 2012-01-31 VITALS — BP 116/71 | HR 61 | Ht 65.0 in | Wt 189.0 lb

## 2012-01-31 DIAGNOSIS — R609 Edema, unspecified: Secondary | ICD-10-CM

## 2012-01-31 DIAGNOSIS — I509 Heart failure, unspecified: Secondary | ICD-10-CM

## 2012-01-31 DIAGNOSIS — I421 Obstructive hypertrophic cardiomyopathy: Secondary | ICD-10-CM

## 2012-01-31 DIAGNOSIS — I10 Essential (primary) hypertension: Secondary | ICD-10-CM

## 2012-01-31 LAB — BASIC METABOLIC PANEL
Calcium: 9 mg/dL (ref 8.4–10.5)
Creatinine, Ser: 1 mg/dL (ref 0.4–1.2)

## 2012-01-31 MED ORDER — FUROSEMIDE 20 MG PO TABS: ORAL_TABLET | ORAL | Status: DC

## 2012-01-31 NOTE — Assessment & Plan Note (Signed)
Apical hypertrophic cardiomyopathy. Continue beta blocker.

## 2012-01-31 NOTE — Assessment & Plan Note (Signed)
Continue present blood pressure medications. 

## 2012-01-31 NOTE — Progress Notes (Signed)
   HPI: Pleasant female with a h/o normal cardiac catheterization in 2009 other than apical hypertrophy for fu. Last echo in Dec 2012 showed EF 65-70, mild mitral regurgitation, moderately elevated pulmonary pressures. A Holter monitor showed no nonsustained ventricular tachycardia. An exercise treadmill revealed no decrease in systolic blood pressure with exercise. Also with history of diastolic congestive heart failure. Since she was last seen, she does occasionally have increased dyspnea on exertion. There is no orthopnea, PND or pedal edema. No significant exertional chest pain. No syncope.   Current Outpatient Prescriptions  Medication Sig Dispense Refill  . aspirin 81 MG EC tablet Take 81 mg by mouth daily.        . fish oil-omega-3 fatty acids 1000 MG capsule Take 2 g by mouth daily.        . furosemide (LASIX) 20 MG tablet Take 20 mg by mouth daily.      Marland Kitchen lisinopril-hydrochlorothiazide (PRINZIDE,ZESTORETIC) 20-25 MG per tablet Take 1 tablet by mouth daily.      . metoprolol succinate (TOPROL-XL) 25 MG 24 hr tablet Take 25 mg by mouth daily.      . Multiple Vitamin (MULTIVITAMIN) tablet Take 1 tablet by mouth daily.        . potassium chloride SA (K-DUR,KLOR-CON) 20 MEQ tablet Take 20 mEq by mouth daily.         Past Medical History  Diagnosis Date  . Chest pain     cath 7/09: normal cors; EF 65%  . HTN (hypertension)   . Hyperlipidemia   . HOCM (hypertrophic obstructive cardiomyopathy)     echo 10/11: normal LVF; apical hypertropy; mod LAE; mild MR;      Holter 10/11: no NSVT;     ETT 10/11: no decreased BP with exercise   . Diastolic congestive heart failure     Past Surgical History  Procedure Date  . Tubal ligation   . Cardiac catheterization     History   Social History  . Marital Status: Married    Spouse Name: N/A    Number of Children: N/A  . Years of Education: N/A   Occupational History  . Not on file.   Social History Main Topics  . Smoking status:  Never Smoker   . Smokeless tobacco: Never Used  . Alcohol Use: No  . Drug Use: No  . Sexually Active: Yes    Birth Control/ Protection: None   Other Topics Concern  . 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  Is planning on adopting 3 foster children (2012)    ROS: no fevers or chills, productive cough, hemoptysis, dysphasia, odynophagia, melena, hematochezia, dysuria, hematuria, rash, seizure activity, orthopnea, PND, pedal edema, claudication. Remaining systems are negative.  Physical Exam: Well-developed well-nourished in no acute distress.  Skin is warm and dry.  HEENT is normal.  Neck is supple.  Chest is clear to auscultation with normal expansion.  Cardiovascular exam is regular rate and rhythm.  Abdominal exam nontender or distended. No masses palpated. Extremities show no edema. neuro grossly intact

## 2012-01-31 NOTE — Patient Instructions (Signed)
Your physician recommends that you schedule a follow-up appointment in: 3 MONTHS WITH DR Jens Som Your physician has recommended you make the following change in your medication:  MAY TAKE AN EXTRA FUROSEMIDE AS NEEDED FOR WEIGHT GAIN OF 2 POUNDS OR MORE IN 24 HOUR PERIOD   OR FOR  SHORTNESS OF BREATH   Your physician recommends that you return for lab work in: TODAY BMET BNP   DX  V58.69  401.1

## 2012-01-31 NOTE — Assessment & Plan Note (Signed)
Patient has chronic diastolic congestive heart failure. She does note increased dyspnea at times. We discussed low-sodium diet. She will weigh herself daily and take an additional 20 mg of Lasix when necessary increased weight gain of 2 pounds. She will also take an additional 20 mg daily as needed for increased dyspnea. Check potassium, renal function and BNP.

## 2012-02-12 ENCOUNTER — Other Ambulatory Visit: Payer: Self-pay | Admitting: Family Medicine

## 2012-02-19 ENCOUNTER — Other Ambulatory Visit: Payer: 59

## 2012-03-13 ENCOUNTER — Other Ambulatory Visit: Payer: Self-pay | Admitting: Family Medicine

## 2012-04-08 ENCOUNTER — Other Ambulatory Visit: Payer: Self-pay | Admitting: Cardiology

## 2012-04-18 ENCOUNTER — Other Ambulatory Visit: Payer: Self-pay | Admitting: Family Medicine

## 2012-04-30 ENCOUNTER — Encounter: Payer: Self-pay | Admitting: Cardiology

## 2012-04-30 ENCOUNTER — Ambulatory Visit (INDEPENDENT_AMBULATORY_CARE_PROVIDER_SITE_OTHER): Payer: 59 | Admitting: Cardiology

## 2012-04-30 VITALS — BP 120/70 | HR 58 | Ht 65.0 in | Wt 197.0 lb

## 2012-04-30 DIAGNOSIS — I509 Heart failure, unspecified: Secondary | ICD-10-CM

## 2012-04-30 DIAGNOSIS — R0602 Shortness of breath: Secondary | ICD-10-CM

## 2012-04-30 DIAGNOSIS — I421 Obstructive hypertrophic cardiomyopathy: Secondary | ICD-10-CM

## 2012-04-30 DIAGNOSIS — I10 Essential (primary) hypertension: Secondary | ICD-10-CM

## 2012-04-30 LAB — BASIC METABOLIC PANEL
Calcium: 9.2 mg/dL (ref 8.4–10.5)
GFR: 70.63 mL/min (ref >60.00)
Potassium: 3.7 mEq/L (ref 3.5–5.1)
Sodium: 136 mEq/L (ref 135–145)

## 2012-04-30 LAB — BRAIN NATRIURETIC PEPTIDE: Pro B Natriuretic peptide (BNP): 317 pg/mL — ABNORMAL HIGH (ref 0.0–100.0)

## 2012-04-30 NOTE — Patient Instructions (Addendum)
Your physician wants you to follow-up in: 6 MONTHS WITH DR CRENSHAW You will receive a reminder letter in the mail two months in advance. If you don't receive a letter, please call our office to schedule the follow-up appointment.   Your physician recommends that you HAVE LAB WORK TODAY 

## 2012-04-30 NOTE — Progress Notes (Signed)
HPI: 52 year old female for followup of apical hypertrophic cardiomyopathy. A myoview in 2009 showed EF 44%, mild mid and apical anterior wall ischemia. She had a normal cardiac catheterization in 2009 other than apical hypertrophy. Echocardiogram in October of 2011 revealed normal LV function and apical hypertrophy. There was moderate left atrial enlargement and mild mitral regurgitation. A Holter monitor showed no nonsustained ventricular tachycardia. An exercise treadmill revealed no decrease in systolic blood pressure with exercise. Echo repeated in December of 2012. There was severe left ventricular hypertrophy and her ejection fraction was 65-70%. Mild mitral regurgitation. Moderately elevated pulmonary pressures. Also with history of diastolic congestive heart failure. I last saw her in Oct 2013. Since then, she occasionally has some dyspnea on exertion but no orthopnea, PND, palpitations, syncope or chest pain. Occasional minimal pedal edema. She is taking Lasix twice daily.    Current Outpatient Prescriptions  Medication Sig Dispense Refill  . aspirin 81 MG EC tablet Take 81 mg by mouth daily.        . fish oil-omega-3 fatty acids 1000 MG capsule Take 2 g by mouth daily.        . furosemide (LASIX) 20 MG tablet TAKE 1 TABLET BY MOUTH EVERY DAY AS NEEDED  30 tablet  10  . lisinopril-hydrochlorothiazide (PRINZIDE,ZESTORETIC) 20-25 MG per tablet Take 1 tablet by mouth daily.      . metoprolol succinate (TOPROL-XL) 25 MG 24 hr tablet Take 25 mg by mouth daily.      . Multiple Vitamin (MULTIVITAMIN) tablet Take 1 tablet by mouth daily.        . potassium chloride SA (K-DUR,KLOR-CON) 20 MEQ tablet Take 20 mEq by mouth daily.         Past Medical History  Diagnosis Date  . Chest pain     cath 7/09: normal cors; EF 65%  . HTN (hypertension)   . Hyperlipidemia   . HOCM (hypertrophic obstructive cardiomyopathy)     echo 10/11: normal LVF; apical hypertropy; mod LAE; mild MR;      Holter  10/11: no NSVT;     ETT 10/11: no decreased BP with exercise   . Diastolic congestive heart failure     Past Surgical History  Procedure Date  . Tubal ligation   . Cardiac catheterization     History   Social History  . Marital Status: Married    Spouse Name: N/A    Number of Children: N/A  . Years of Education: N/A   Occupational History  . Not on file.   Social History Main Topics  . Smoking status: Never Smoker   . Smokeless tobacco: Never Used  . Alcohol Use: No  . Drug Use: No  . Sexually Active: Yes    Birth Control/ Protection: None   Other Topics Concern  . 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  Is planning on adopting 3 foster children (2012)    ROS: no fevers or chills, productive cough, hemoptysis, dysphasia, odynophagia, melena, hematochezia, dysuria, hematuria, rash, seizure activity, orthopnea, PND, pedal edema, claudication. Remaining systems are negative.  Physical Exam: Well-developed well-nourished in no acute distress.  Skin is warm and dry.  HEENT is normal.  Neck is supple.  Chest is clear to auscultation with normal expansion.  Cardiovascular exam is regular rate and rhythm.  Abdominal exam nontender or  distended. No masses palpated. Extremities show no edema. neuro grossly intact  ECG sinus rhythm at a rate of 58. Left ventricular hypertrophy with repolarization abnormality.

## 2012-04-30 NOTE — Assessment & Plan Note (Signed)
Patient with diastolic congestive heart failure. Continue present dose of Lasix. Check BNP and renal function.

## 2012-04-30 NOTE — Assessment & Plan Note (Signed)
Blood pressure controlled. Continue present medications. 

## 2012-04-30 NOTE — Assessment & Plan Note (Signed)
Patient has apical hypertrophic cardiomyopathy. Continue beta blocker. No risk factors for sudden death. She has previously been instructed to have her children screened.

## 2012-06-05 ENCOUNTER — Other Ambulatory Visit: Payer: Self-pay | Admitting: Family Medicine

## 2012-07-04 ENCOUNTER — Ambulatory Visit (INDEPENDENT_AMBULATORY_CARE_PROVIDER_SITE_OTHER): Payer: 59 | Admitting: Family Medicine

## 2012-07-04 ENCOUNTER — Encounter: Payer: Self-pay | Admitting: Family Medicine

## 2012-07-04 VITALS — BP 132/76 | HR 71 | Temp 98.2°F | Ht 65.0 in | Wt 192.2 lb

## 2012-07-04 DIAGNOSIS — Z0000 Encounter for general adult medical examination without abnormal findings: Secondary | ICD-10-CM | POA: Insufficient documentation

## 2012-07-04 DIAGNOSIS — E559 Vitamin D deficiency, unspecified: Secondary | ICD-10-CM

## 2012-07-04 LAB — LIPID PANEL
HDL: 39 mg/dL — ABNORMAL LOW (ref >39)
LDL Cholesterol: 97 mg/dL (ref 0–99)
VLDL: 17 mg/dL (ref 0–40)

## 2012-07-04 LAB — COMPREHENSIVE METABOLIC PANEL
AST: 16 U/L (ref 0–37)
Albumin: 3.9 g/dL (ref 3.5–5.2)
Alkaline Phosphatase: 46 U/L (ref 39–117)
BUN: 13 mg/dL (ref 6–23)
Potassium: 4 mEq/L (ref 3.5–5.3)
Sodium: 137 mEq/L (ref 135–145)
Total Bilirubin: 0.8 mg/dL (ref 0.3–1.2)
Total Protein: 7 g/dL (ref 6.0–8.3)

## 2012-07-04 MED ORDER — LISINOPRIL-HYDROCHLOROTHIAZIDE 20-25 MG PO TABS: 1.0000 | ORAL_TABLET | Freq: Every day | ORAL | Status: DC

## 2012-07-04 NOTE — Patient Instructions (Addendum)
Follow-up yearly  Will get labwork today- will send you a letter if all normal  See info on scheduling mammogram for screening for colon cancer when you turn 52  Schedule TB test at your convenience- must be ready in 48-72 hours

## 2012-07-04 NOTE — Assessment & Plan Note (Signed)
Will check today

## 2012-07-04 NOTE — Progress Notes (Signed)
  Subjective:    Patient ID: [name], female    DOB: Feb 22, 1961, 52 y.o.   MRN: [id number]  HPI Annual Physical Exam  Wt Readings from Last 3 Encounters:  07/04/12 192 lb 3.2 oz (87.181 kg)  04/30/12 197 lb (89.359 kg)  01/31/12 189 lb (85.73 kg)   Last period: 06/21/2012 Regular periods: yes Heavy bleeding: some, was thinking about hysterectomy, now waiting on menopause  Sexually active: yes Birth control or hormonal therapy: Hx of STD: Patient desires STD screening Dyspareunia: No Hot flashes: Yes, manageable   Last mammogram: Feb 2013 is scheduled for repeat     Review of Systems Patient Information Form: Screening and ROS  AUDIT-C Score: 0 Do you feel safe in relationships? yes PHQ-2:negative  Review of Symptoms  General:  Negative for nexplained weight loss, fever Skin: Negative for new or changing mole, sore that won't heal HEENT: Negative for trouble hearing, trouble seeing, ringing in ears, mouth sores, hoarseness, change in voice, dysphagia. CV:  Negative for chest pain, dyspnea, edema, palpitations Resp: Negative for cough, dyspnea, hemoptysis GI: Negative for nausea, vomiting, diarrhea, constipation, abdominal pain, melena, hematochezia. GU: Negative for dysuria, incontinence, urinary hesitance, hematuria, vaginal or penile discharge, polyuria, sexual difficulty, lumps in testicle or breasts MSK: Negative for muscle cramps or aches, joint pain or swelling Neuro: Negative for headaches, weakness, numbness, dizziness, passing out/fainting Psych: Negative for depression, anxiety, memory problems      Objective:   Physical Exam  GEN: Alert & Oriented, No acute distress HEENT: [place]/AT. EOMI, PERRLA, no conjunctival injection or scleral icterus.  Bilateral tympanic membranes intact without erythema or effusion.  .  Nares without edema or rhinorrhea.  Oropharynx is without erythema or exudates.  No anterior or posterior cervical lymphadenopathy. CV:   Regular Rate & Rhythm, no murmur Respiratory:  Normal work of breathing, CTAB Abd:  + BS, soft, no tenderness to palpation Ext: no pre-tibial edema       Assessment & Plan:

## 2012-07-04 NOTE — Assessment & Plan Note (Signed)
Doing well, at goal.  WIll check CMET, Lipids today.

## 2012-07-04 NOTE — Assessment & Plan Note (Signed)
Screening UTD.  Declines flu shot today.  Gave info on screening mammogram when she turns 52 this month.  Follow-up annually

## 2012-07-05 ENCOUNTER — Telehealth: Payer: Self-pay | Admitting: Family Medicine

## 2012-07-05 DIAGNOSIS — E559 Vitamin D deficiency, unspecified: Secondary | ICD-10-CM

## 2012-07-05 MED ORDER — ERGOCALCIFEROL 1.25 MG (50000 UT) PO CAPS: 50000.0000 [IU] | ORAL_CAPSULE | ORAL | Status: DC

## 2012-07-05 NOTE — Telephone Encounter (Signed)
Discussed labs and vit d supplementaition

## 2012-07-05 NOTE — Assessment & Plan Note (Signed)
d3 50K x 8 weeks, then 1-2K daily.  Recheck in 4-6 months

## 2012-07-05 NOTE — Telephone Encounter (Signed)
Called patient to discuss low vitamin d level

## 2012-07-05 NOTE — Telephone Encounter (Signed)
Patient is returning call to Dr. Earnest Bailey and is requesting a return call at [phone].

## 2012-07-05 NOTE — Telephone Encounter (Signed)
Will forward to Dr. Briscoe. 

## 2012-07-05 NOTE — Addendum Note (Signed)
Addended by: Macy Mis on: 07/05/2012 04:03 PM   Modules accepted: Orders

## 2012-07-08 ENCOUNTER — Telehealth: Payer: Self-pay | Admitting: Family Medicine

## 2012-07-08 MED ORDER — ERGOCALCIFEROL 1.25 MG (50000 UT) PO CAPS: 50000.0000 [IU] | ORAL_CAPSULE | ORAL | Status: DC

## 2012-07-08 NOTE — Telephone Encounter (Signed)
[facility] [address] did not receive the Rx for Vitamin D on Friday so it needs to be resent.

## 2012-07-08 NOTE — Telephone Encounter (Signed)
[facility] and Rx has been received  Call patient and left message with pts husband that [facility] has Rx for Vit D.  Deanne Coffer

## 2012-08-01 ENCOUNTER — Other Ambulatory Visit: Payer: Self-pay

## 2012-08-01 DIAGNOSIS — Z1231 Encounter for screening mammogram for malignant neoplasm of breast: Secondary | ICD-10-CM

## 2012-08-12 ENCOUNTER — Other Ambulatory Visit: Payer: Self-pay | Admitting: *Deleted

## 2012-08-12 ENCOUNTER — Encounter: Payer: Self-pay | Admitting: *Deleted

## 2012-08-12 MED ORDER — METOPROLOL SUCCINATE ER 25 MG PO TB24: 25.0000 mg | ORAL_TABLET | Freq: Every day | ORAL | Status: DC

## 2012-08-12 NOTE — Telephone Encounter (Signed)
Fax Received. Refill Completed. [name] (R.M.A)   

## 2012-08-15 ENCOUNTER — Ambulatory Visit
Admission: RE | Admit: 2012-08-15 | Discharge: 2012-08-15 | Disposition: A | Discharge status: Home / Self Care | Payer: 59 | Source: Ambulatory Visit

## 2012-08-15 DIAGNOSIS — Z1231 Encounter for screening mammogram for malignant neoplasm of breast: Secondary | ICD-10-CM

## 2012-09-11 ENCOUNTER — Other Ambulatory Visit: Payer: Self-pay | Admitting: Cardiology

## 2012-09-12 ENCOUNTER — Other Ambulatory Visit: Payer: Self-pay | Admitting: *Deleted

## 2012-09-12 NOTE — Telephone Encounter (Signed)
Opened in Error.

## 2012-09-12 NOTE — Telephone Encounter (Signed)
Per notes on file, pt is taking K+ Once Daily. Pharmacy is requesting 32Tablets. Called pt home number but it was a fax. Lm on pt cell number to call office to verify how she takes her K+. Number provided.

## 2012-10-04 ENCOUNTER — Telehealth: Payer: Self-pay | Admitting: *Deleted

## 2012-10-04 ENCOUNTER — Encounter: Payer: Self-pay | Admitting: Physician Assistant

## 2012-10-04 ENCOUNTER — Ambulatory Visit (INDEPENDENT_AMBULATORY_CARE_PROVIDER_SITE_OTHER): Payer: 59 | Admitting: Physician Assistant

## 2012-10-04 VITALS — BP 118/62 | HR 60 | Ht 65.0 in | Wt 192.8 lb

## 2012-10-04 DIAGNOSIS — I5032 Chronic diastolic (congestive) heart failure: Secondary | ICD-10-CM

## 2012-10-04 DIAGNOSIS — I10 Essential (primary) hypertension: Secondary | ICD-10-CM

## 2012-10-04 DIAGNOSIS — I421 Obstructive hypertrophic cardiomyopathy: Secondary | ICD-10-CM

## 2012-10-04 DIAGNOSIS — R0789 Other chest pain: Secondary | ICD-10-CM

## 2012-10-04 DIAGNOSIS — R109 Unspecified abdominal pain: Secondary | ICD-10-CM

## 2012-10-04 LAB — BASIC METABOLIC PANEL
CO2: 24 mEq/L (ref 19–32)
Calcium: 9.3 mg/dL (ref 8.4–10.5)
Creatinine, Ser: 1 mg/dL (ref 0.4–1.2)
Sodium: 137 mEq/L (ref 135–145)

## 2012-10-04 LAB — CBC WITH DIFFERENTIAL/PLATELET
Basophils Relative: 0.5 % (ref 0.0–3.0)
Eosinophils Relative: 4.3 % (ref 0.0–5.0)
Hemoglobin: 12.2 g/dL (ref 12.0–15.0)
Lymphocytes Relative: 25.5 % (ref 12.0–46.0)
MCHC: 32.3 g/dL (ref 30.0–36.0)
Monocytes Relative: 16.2 % — ABNORMAL HIGH (ref 3.0–12.0)
Neutro Abs: 3.6 10*3/uL (ref 1.4–7.7)
Neutrophils Relative %: 53.5 % (ref 43.0–77.0)
RBC: 4.69 Mil/uL (ref 3.87–5.11)
WBC: 6.7 10*3/uL (ref 4.5–10.5)

## 2012-10-04 LAB — HEPATIC FUNCTION PANEL
ALT: 15 U/L (ref 0–35)
AST: 17 U/L (ref 0–37)
Alkaline Phosphatase: 42 U/L (ref 39–117)
Bilirubin, Direct: 0 mg/dL (ref 0.0–0.3)
Total Bilirubin: 0.6 mg/dL (ref 0.3–1.2)
Total Protein: 7.1 g/dL (ref 6.0–8.3)

## 2012-10-04 MED ORDER — OMEPRAZOLE 20 MG PO CPDR: DELAYED_RELEASE_CAPSULE | ORAL | Status: DC

## 2012-10-04 NOTE — Telephone Encounter (Signed)
Message copied by Tarri Fuller on Fri Oct 04, 2012  6:38 PM ------      Message from: [place], [place] T      Created: Fri Oct 04, 2012  2:49 PM       Potassium and creatinine okay      Hemoglobin normal      BNP stable-no need to adjust Lasix      LFTs normal      Continue with current treatment plan.      Tereso Newcomer, PA-C        10/04/2012 2:48 PM ------

## 2012-10-04 NOTE — Telephone Encounter (Signed)
lmom labs ok, no changes to be made 

## 2012-10-04 NOTE — Patient Instructions (Addendum)
START OTC MIRALAX DAILY FOLLOW DIRECTIONS AS DIRECTED  LABS TODAY; BMET, CBC W/DIFF, BNP, LFT  PLEASE SCHEDULE FOR A COMPLETE ABDOMINAL U/S; DX EPIGASTRIC PAIN  MAKE SURE TO FOLLOW UP WITH YOUR PRIMARY CARE PHYSICIAN  PLEASE FOLLOW UP WITH DR. CRENSHAW IN ABOUT 1-2 MONTHS  AN RX FOR PRILOSEC 20 MG DAILY HAS BEEN SENT IN TODAY FOR YOU; TAKE 1 CAP DAILY FOR 2 WEEKS THEN STOP; IF YOU DON'T FEEL BETTER AFTER STOPPING THEN GO BACK ON 1 CAP DAILY

## 2012-10-04 NOTE — Progress Notes (Signed)
[address] [place], [place]  [address] Phone: [phone] Fax:  [phone]  Date:  10/04/2012   ID:  [name], DOB 29-Nov-1960, MRN [id number]  PCP:  Janit Pagan, MD  Cardiologist:  Dr. Olga Millers     History of Present Illness: [name] is a 52 y.o. female who returns for evaluation of abdominal pain.  She has a hx of apical hypertrophic CM, HTN, HL.  Myoview in 2009: EF 44%, mild mid and apical ant ischemia.  LHC 2009:  Normal coronary arteries.  Echo 03/2011: severe LVH, EF 65-70%, mild MR, RVH, PASP 50.  Prior ETT demonstrated no decrease in BP with exercise.  Holter monitor demonstrated no NSVT.    Over the last several months, she has noted increased bloating. Along with this, she will often get a discomfort in her left upper abdomen/left lower chest described as a knot. She has noted increasing episodes of diarrhea alternating with constipation. She has felt at times that belching makes her symptoms better. She was able to obtain relief at one point with Alka-Seltzer. She notes that her symptoms are much worse around the time of her menses. She has had a history of atypical chest pains. She denies any significant changes. She has days where she is more short of breath than others. Overall, her shortness of breath seems to be stable. She describes NYHA class II-IIb symptoms. She denies syncope. She has slept an incline for years. There have been no changes. She denies PND or edema. She notes occasional palpitations. These are not unusual for her.  Labs (9/11):  TSH 0.502 Labs (8/13):  Hgb 13.2 Labs (1/14):  proBNP 317 Labs (3/14):  K 4, Cr 0.98, ALT 14, LDL 97    Wt Readings from Last 3 Encounters:  10/04/12 192 lb 12.8 oz (87.454 kg)  07/04/12 192 lb 3.2 oz (87.181 kg)  04/30/12 197 lb (89.359 kg)     Past Medical History  Diagnosis Date  . Chest pain     cath 7/09: normal cors; EF 65%  . HTN (hypertension)   . Hyperlipidemia     . HOCM (hypertrophic obstructive cardiomyopathy)     echo 10/11: normal LVF; apical hypertropy; mod LAE; mild MR;      Holter 10/11: no NSVT;     ETT 10/11: no decreased BP with exercise   . Diastolic congestive heart failure     Current Outpatient Prescriptions  Medication Sig Dispense Refill  . aspirin 81 MG EC tablet Take 81 mg by mouth daily.        . furosemide (LASIX) 20 MG tablet TAKE 1 TABLET BY MOUTH EVERY DAY AS NEEDED  30 tablet  10  . lisinopril-hydrochlorothiazide (PRINZIDE,ZESTORETIC) 20-25 MG per tablet Take 1 tablet by mouth daily.  90 tablet  3  . metoprolol succinate (TOPROL-XL) 25 MG 24 hr tablet Take 1 tablet (25 mg total) by mouth daily.  30 tablet  5  . Multiple Vitamin (MULTIVITAMIN) tablet Take 1 tablet by mouth daily.        . potassium chloride SA (K-DUR,KLOR-CON) 20 MEQ tablet Take 20 mEq by mouth daily. As needed with prn lasix.  Per cardiology      . potassium chloride SA (K-DUR,KLOR-CON) 20 MEQ tablet Take 1 tablet (20 mEq total) by mouth daily.  32 tablet  3  . fish oil-omega-3 fatty acids 1000 MG capsule Take 2 g by mouth daily.  No current facility-administered medications for this visit.    Allergies:    Allergies  Allergen Reactions  . Codeine Phosphate Rash    itching    Social History:  The patient  reports that she has never smoked. She has never used smokeless tobacco. She reports that she does not drink alcohol or use illicit drugs.   ROS:  Please see the history of present illness.   She denies melena, hematochezia, vomiting, dysphagia, odynophagia, weight loss.   All other systems reviewed and negative.   PHYSICAL EXAM: VS:  BP 118/62  Pulse 60  Ht 5\' 5"  (1.651 m)  Wt 192 lb 12.8 oz (87.454 kg)  BMI 32.08 kg/m2 Well nourished, well developed, in no acute distress HEENT: normal Neck: no JVD Cardiac:  normal S1, S2; RRR; 2/6 systolic murmur along the LSB Lungs:  clear to auscultation bilaterally, no wheezing, rhonchi or  rales Abd: soft, mild epigastric tenderness to deep palpation, no hepatomegaly Ext: no edema Skin: warm and dry Neuro:  CNs 2-12 intact, no focal abnormalities noted  EKG:  NSR, HR 60, LVH with repolarization abnormality, no change from prior tracing     ASSESSMENT AND PLAN:  1. Abdominal Pain: Symptoms are noncardiac. I reassured her today. I suspect she has an element of IBS as well as possible GERD. Some of her symptoms may be due to premenstrual syndrome. She sees her gynecologist soon for further evaluation. I will place her on Prilosec 20 mg daily for 2 weeks. I have advised her to obtain Miralax and used daily for more consistent stools. Check a basic metabolic panel, CBC, LFTs. Check abdominal ultrasound. I have asked her to follow up with her primary care provider. 2. HOCM: Last echo in 2012 was stable. She has not had risk factors for sudden cardiac death. No further testing indicated at this time. 3. Chronic Diastolic CHF: I do not believe that her current symptoms represent increasing volume. She does have some episodes of shortness of breath. I will check a BNP with her labs today. If this is significantly elevated, adjust Lasix. 4. Hypertension: Controlled. Continue current therapy. 5. Disposition: Follow up with Dr. Jens Som in 2 months as previously planned  Signed, Tereso Newcomer, PA-C  10/04/2012 8:27 AM

## 2012-10-09 ENCOUNTER — Ambulatory Visit (HOSPITAL_COMMUNITY): Payer: 59

## 2012-12-24 ENCOUNTER — Ambulatory Visit (INDEPENDENT_AMBULATORY_CARE_PROVIDER_SITE_OTHER): Payer: 59 | Admitting: Cardiology

## 2012-12-24 ENCOUNTER — Encounter: Payer: Self-pay | Admitting: Cardiology

## 2012-12-24 VITALS — BP 124/80 | HR 64 | Wt 193.0 lb

## 2012-12-24 DIAGNOSIS — I421 Obstructive hypertrophic cardiomyopathy: Secondary | ICD-10-CM

## 2012-12-24 DIAGNOSIS — I509 Heart failure, unspecified: Secondary | ICD-10-CM

## 2012-12-24 DIAGNOSIS — I10 Essential (primary) hypertension: Secondary | ICD-10-CM

## 2012-12-24 NOTE — Assessment & Plan Note (Signed)
Continue beta blocker. 

## 2012-12-24 NOTE — Assessment & Plan Note (Signed)
Continue present dose of diuretic. Patient euvolemic on examination.

## 2012-12-24 NOTE — Assessment & Plan Note (Signed)
Blood pressure controlled. Continue present medications. 

## 2012-12-24 NOTE — Progress Notes (Signed)
HPI: Followup of apical hypertrophic cardiomyopathy. A myoview in 2009 showed EF 44%, mild mid and apical anterior wall ischemia. She had a normal cardiac catheterization in 2009 other than apical hypertrophy. A Holter monitor showed no nonsustained ventricular tachycardia. An exercise treadmill revealed no decrease in systolic blood pressure with exercise. Echo repeated in December of 2012. There was severe left ventricular hypertrophy and her ejection fraction was 65-70%. Mild mitral regurgitation. Moderately elevated pulmonary pressures. Also with history of diastolic congestive heart failure. Since she was last seen, the patient has dyspnea with more extreme activities but not with routine activities. It is relieved with rest. It is not associated with chest pain. There is no orthopnea, PND or pedal edema. There is no syncope or palpitations. There is no exertional chest pain.    Current Outpatient Prescriptions  Medication Sig Dispense Refill  . aspirin 81 MG EC tablet Take 81 mg by mouth daily.        . Cholecalciferol (VITAMIN D PO) Take 1 tablet by mouth daily.      . fish oil-omega-3 fatty acids 1000 MG capsule Take 2 g by mouth daily.        . furosemide (LASIX) 20 MG tablet TAKE 1 TABLET BY MOUTH EVERY DAY AS NEEDED  30 tablet  10  . lisinopril-hydrochlorothiazide (PRINZIDE,ZESTORETIC) 20-25 MG per tablet Take 1 tablet by mouth daily.  90 tablet  3  . metoprolol succinate (TOPROL-XL) 25 MG 24 hr tablet Take 1 tablet (25 mg total) by mouth daily.  30 tablet  5  . Multiple Vitamin (MULTIVITAMIN) tablet Take 1 tablet by mouth daily.        . NON FORMULARY Herbal Supplement 1 tab po qd      . potassium chloride SA (K-DUR,KLOR-CON) 20 MEQ tablet Take 20 mEq by mouth daily. As needed with prn lasix.  Per cardiology       No current facility-administered medications for this visit.     Past Medical History  Diagnosis Date  . Chest pain     cath 7/09: normal cors; EF 65%  . HTN  (hypertension)   . Hyperlipidemia   . HOCM (hypertrophic obstructive cardiomyopathy)     echo 10/11: normal LVF; apical hypertropy; mod LAE; mild MR;      Holter 10/11: no NSVT;     ETT 10/11: no decreased BP with exercise   . Diastolic congestive heart failure     Past Surgical History  Procedure Laterality Date  . Tubal ligation    . Cardiac catheterization      History   Social History  . Marital Status: Married    Spouse Name: N/A    Number of Children: N/A  . Years of Education: N/A   Occupational History  . Not on file.   Social History Main Topics  . Smoking status: Never Smoker   . Smokeless tobacco: Never Used  . Alcohol Use: No  . Drug Use: No  . Sexual Activity: Yes    Birth Control/ Protection: None   Other Topics Concern  . 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.  Is planning on adopting 3 foster children (2012)          ROS: some abdominal pain but no fevers or chills, productive cough, hemoptysis, dysphasia, odynophagia, melena, hematochezia, dysuria, hematuria, rash, seizure  activity, orthopnea, PND, pedal edema, claudication. Remaining systems are negative.  Physical Exam: Well-developed well-nourished in no acute distress.  Skin is warm and dry.  HEENT is normal.  Neck is supple.  Chest is clear to auscultation with normal expansion.  Cardiovascular exam is regular rate and rhythm.  Abdominal exam nontender or distended. No masses palpated. Extremities show no edema. neuro grossly intact

## 2012-12-24 NOTE — Patient Instructions (Addendum)
Your physician wants you to follow-up in: ONE YEAR WITH DR CRENSHAW You will receive a reminder letter in the mail two months in advance. If you don't receive a letter, please call our office to schedule the follow-up appointment.  

## 2013-01-10 ENCOUNTER — Other Ambulatory Visit: Payer: Self-pay | Admitting: Physician Assistant

## 2013-01-10 ENCOUNTER — Telehealth: Payer: Self-pay | Admitting: *Deleted

## 2013-01-10 NOTE — Telephone Encounter (Signed)
I rcv'd a message from the refill room today about filling prilosec for pt. I called the pt and she states to me that she is not taking and not to fill the medication. I s/w pharmacist and advised pt no longer taking , pharmacist said pt called her today and asked for the refill however she said she will discontinue the med.

## 2013-01-28 ENCOUNTER — Ambulatory Visit: Payer: 59 | Admitting: Family Medicine

## 2013-01-28 ENCOUNTER — Encounter: Payer: Self-pay | Admitting: Family Medicine

## 2013-01-28 ENCOUNTER — Ambulatory Visit (INDEPENDENT_AMBULATORY_CARE_PROVIDER_SITE_OTHER): Payer: 59 | Admitting: Family Medicine

## 2013-01-28 VITALS — BP 122/73 | HR 73 | Temp 97.6°F | Wt 190.0 lb

## 2013-01-28 DIAGNOSIS — K5900 Constipation, unspecified: Secondary | ICD-10-CM

## 2013-01-28 DIAGNOSIS — K219 Gastro-esophageal reflux disease without esophagitis: Secondary | ICD-10-CM

## 2013-01-28 MED ORDER — POLYETHYLENE GLYCOL 3350 17 GM/SCOOP PO POWD: 17.0000 g | Freq: Two times a day (BID) | ORAL | Status: DC | PRN

## 2013-01-28 MED ORDER — PANTOPRAZOLE SODIUM 40 MG PO TBEC: 40.0000 mg | DELAYED_RELEASE_TABLET | Freq: Every day | ORAL | Status: DC

## 2013-01-28 MED ORDER — PANTOPRAZOLE SODIUM 40 MG PO TBEC
40.0000 mg | DELAYED_RELEASE_TABLET | Freq: Every day | ORAL | Status: DC
Start: 2013-01-28 — End: 2013-01-28

## 2013-01-28 NOTE — Progress Notes (Signed)
[facility] Tana Conch, MD Phone: [phone]  Subjective:  Chief complaint-noted  Epigastric burning and bloating Patient states she has had intermittent epigastric burning and bloating since April. Bloating and burning occur after meals and are particularly bad at night. Since July patient states intensity has been worse. She also is no longer having every day bowel movements and is noting BM 2-3 x a week. Bloating has been worse since that time. She was seen by her cardiology group in June with similar symptoms and was started on a PPI and sent for ultrasound (which she didn't complete). Also was supposed to start miralax but did not. She has been trying to increase fiber in diet but this is the only thing she has tried.  ROS-no nausea/vomiting. Some early satiety due to the bloating. No unintentional weight loss-2.8 lbs since June lighter.   ROS--See HPI  Past Medical History-HOCM followed by [place] [facility], heart failure as a result, hypertension   Medications- reviewed and updated Current Outpatient Prescriptions on File Prior to Visit  Medication Sig Dispense Refill  . aspirin 81 MG EC tablet Take 81 mg by mouth daily.        . Cholecalciferol (VITAMIN D PO) Take 1 tablet by mouth daily.      . fish oil-omega-3 fatty acids 1000 MG capsule Take 2 g by mouth daily.        . furosemide (LASIX) 20 MG tablet TAKE 1 TABLET BY MOUTH EVERY DAY AS NEEDED  30 tablet  10  . lisinopril-hydrochlorothiazide (PRINZIDE,ZESTORETIC) 20-25 MG per tablet Take 1 tablet by mouth daily.  90 tablet  3  . metoprolol succinate (TOPROL-XL) 25 MG 24 hr tablet Take 1 tablet (25 mg total) by mouth daily.  30 tablet  5  . Multiple Vitamin (MULTIVITAMIN) tablet Take 1 tablet by mouth daily.        . NON FORMULARY Herbal Supplement 1 tab po qd      . potassium chloride SA (K-DUR,KLOR-CON) 20 MEQ tablet Take 20 mEq by mouth daily. As needed with prn lasix.  Per cardiology       No  current facility-administered medications on file prior to visit.   Objective: BP 122/73  Pulse 73  Temp(Src) 97.6 F (36.4 C) (Oral)  Wt 190 lb (86.183 kg)  BMI 31.62 kg/m2  LMP 01/20/2013 Gen: NAD, resting comfortably  Assessment/Plan: Refused flu shot today-wants to get at a pharmacy  Epigastric burning and bloating and constipation Labs reviewed from June including bmet and CBC and LFTs and all unremarkable.  Patient very comfortable on exam and no distress. No current pain or discomfort.  Several features of GERD, only tried 2 weeks of omeprazole previously. Will try at least a 6 week course of protonix.  Patient also with bowel movements 2-3 x a week when previously daily. Time for screening colonoscopy regardless. Will trial miralax and titrate to daily BM.   If symptoms improve on above, will send for screening colonoscopy at follow up in a month, if no improvement, could consider sending for more complete evaluation by GI. Patient to see Dr. Lum Babe in 1 month.

## 2013-01-28 NOTE — Patient Instructions (Signed)
You have some symptoms of GERD, I want you to trial this protonix mediation for at least 6 weeks, sometimes it takes time for it to take affect.   I also wonder if constipation is contributing to your bloated feeling. Please take miralax 1-2 x a day to try to have a bowel movement more regularly.   If these things help, we will simply send you to a GI doctor for a screening colonoscopy, if they do not help, we may send you there for evaluation of the above.  Health Maintenance Due  Topic Date Due  . Colonoscopy  07/20/2012  . Influenza Vaccine  11/22/2012   See Dr. Lum Babe in 1 month,  Dr. Durene Cal

## 2013-03-23 ENCOUNTER — Other Ambulatory Visit: Payer: Self-pay | Admitting: Cardiology

## 2013-04-15 ENCOUNTER — Other Ambulatory Visit: Payer: Self-pay | Admitting: Cardiology

## 2013-04-23 ENCOUNTER — Other Ambulatory Visit: Payer: Self-pay

## 2013-04-23 MED ORDER — POTASSIUM CHLORIDE CRYS ER 20 MEQ PO TBCR: 20.0000 meq | EXTENDED_RELEASE_TABLET | Freq: Every day | ORAL | Status: DC

## 2013-05-20 ENCOUNTER — Other Ambulatory Visit: Payer: Self-pay | Admitting: Cardiology

## 2013-05-29 ENCOUNTER — Ambulatory Visit (INDEPENDENT_AMBULATORY_CARE_PROVIDER_SITE_OTHER): Payer: 59 | Admitting: Family Medicine

## 2013-05-29 ENCOUNTER — Encounter: Payer: Self-pay | Admitting: Family Medicine

## 2013-05-29 VITALS — BP 133/75 | HR 74 | Temp 97.7°F | Wt 194.5 lb

## 2013-05-29 DIAGNOSIS — I10 Essential (primary) hypertension: Secondary | ICD-10-CM

## 2013-05-29 DIAGNOSIS — N951 Menopausal and female climacteric states: Secondary | ICD-10-CM

## 2013-05-29 DIAGNOSIS — R109 Unspecified abdominal pain: Secondary | ICD-10-CM

## 2013-05-29 DIAGNOSIS — R232 Flushing: Secondary | ICD-10-CM

## 2013-05-29 DIAGNOSIS — Z0000 Encounter for general adult medical examination without abnormal findings: Secondary | ICD-10-CM

## 2013-05-29 NOTE — Progress Notes (Deleted)
Patient ID: [name], female   DOB: 1960/12/31, 53 y.o.   MRN: [id number] Subjective:     [name] is a 53 y.o. female and is here for a comprehensive physical exam. The patient reports {problems:16946}.  History   Social History  . Marital Status: Married    Spouse Name: N/A    Number of Children: N/A  . Years of Education: N/A   Occupational History  . Not on file.   Social History Main Topics  . Smoking status: Never Smoker   . Smokeless tobacco: Never Used  . Alcohol Use: No  . Drug Use: No  . Sexual Activity: Yes    Birth Control/ Protection: None   Other Topics Concern  . Not on file   Social History Narrative   Married (1st husband died from massive MI), has 5 children (+ 4 from husband's prior marriage), works as Herbalist; Exercises routinely at [facility].  Is planning on adopting 3 foster children (2012)         Health Maintenance  Topic Date Due  . Colonoscopy  07/20/2012  . Influenza Vaccine  11/22/2012  . Pap Smear  08/30/2013  . Mammogram  08/16/2014  . Tetanus/tdap  12/29/2014    {Common ambulatory SmartLinks:19316}  Review of Systems {ros; complete:30496}   Objective:   Physical Exam   Assessment:    Healthy female exam. ***     Plan:     See After Visit Summary for Counseling Recommendations

## 2013-05-29 NOTE — Patient Instructions (Signed)

## 2013-05-29 NOTE — Progress Notes (Signed)
Subjective:     Patient ID: [name], female   DOB: 1960/12/29, 53 y.o.   MRN: [id number]  HPI Abdominal pain:C/O epigastric pressure pain and RLQ abdominal pain been on going for over 6 months but worsening,she was seen recently at our clinic and was started on Protonix with minimal improvement of her symptoms,she denies N/V but she endorsed constipation alternating with diarrhea,denies blood in her stool. Her pain is usually about 8/10 in severity, currently better now.Her last BM was 2 days ago which was normal form.She denies weight loss. Hot flashes:C/O worsening feeling of hot flashes for months, she is not postmenopausal,still having her period which is heavy most of the time. She would like to know about treatment options. HTN: Compliant with her medication,here for follow up. JI:RCVELFY has not had her mammogram for this year,no PAP test that is recent and has never had colonoscopy. She does not take flu shot she said.  Current Outpatient Prescriptions on File Prior to Visit  Medication Sig Dispense Refill  . aspirin 81 MG EC tablet Take 81 mg by mouth daily.        . Cholecalciferol (VITAMIN D PO) Take 1 tablet by mouth daily.      . fish oil-omega-3 fatty acids 1000 MG capsule Take 2 g by mouth daily.        . furosemide (LASIX) 20 MG tablet TAKE 1 TABLET DAILY AS NEEDED  30 tablet  9  . lisinopril-hydrochlorothiazide (PRINZIDE,ZESTORETIC) 20-25 MG per tablet Take 1 tablet by mouth daily.  90 tablet  3  . metoprolol succinate (TOPROL-XL) 25 MG 24 hr tablet TAKE 1 TABLET (25 MG TOTAL) BY MOUTH DAILY.  30 tablet  5  . metoprolol succinate (TOPROL-XL) 25 MG 24 hr tablet TAKE 1 TABLET (25 MG TOTAL) BY MOUTH DAILY.  30 tablet  5  . Multiple Vitamin (MULTIVITAMIN) tablet Take 1 tablet by mouth daily.        . NON FORMULARY Herbal Supplement 1 tab po qd      . pantoprazole (PROTONIX) 40 MG tablet Take 1 tablet (40 mg total) by mouth daily.  30 tablet  3  . polyethylene glycol powder  (GLYCOLAX/MIRALAX) powder Take 17 g by mouth 2 (two) times daily as needed.  3350 g  1  . potassium chloride SA (K-DUR,KLOR-CON) 20 MEQ tablet Take 1 tablet (20 mEq total) by mouth daily. As needed with prn lasix.  Per cardiology  30 tablet  3   No current facility-administered medications on file prior to visit.   Past Medical History  Diagnosis Date  . Chest pain     cath 7/09: normal cors; EF 65%  . HTN (hypertension)   . Hyperlipidemia   . HOCM (hypertrophic obstructive cardiomyopathy)     echo 10/11: normal LVF; apical hypertropy; mod LAE; mild MR;      Holter 10/11: no NSVT;     ETT 10/11: no decreased BP with exercise   . Diastolic congestive heart failure       Review of Systems  Respiratory: Negative.   Cardiovascular: Negative.   Gastrointestinal: Positive for abdominal pain, diarrhea and constipation. Negative for nausea, vomiting and blood in stool.  Genitourinary: Negative.   All other systems reviewed and are negative.   Filed Vitals:   05/29/13 1337  BP: 133/75  Pulse: 74  Temp: 97.7 F (36.5 C)  TempSrc: Oral  Weight: 194 lb 8 oz (88.225 kg)       Objective:   Physical  Exam  Nursing note and vitals reviewed. Constitutional: She appears well-developed. No distress.  Cardiovascular: Normal rate, regular rhythm and intact distal pulses.   No murmur heard. Pulmonary/Chest: Effort normal and breath sounds normal. No respiratory distress. She has no wheezes. She has no rales.  Abdominal: Soft. Bowel sounds are normal. She exhibits no distension and no mass. There is no tenderness. There is no rebound and no guarding.  Musculoskeletal: Normal range of motion. She exhibits no edema.       Assessment:     Abdominal pain:  Hot flashes HTN Health maintenance     Plan:     Check problem list.

## 2013-05-30 ENCOUNTER — Encounter: Payer: Self-pay | Admitting: Gastroenterology

## 2013-05-30 DIAGNOSIS — R109 Unspecified abdominal pain: Secondary | ICD-10-CM | POA: Insufficient documentation

## 2013-05-30 DIAGNOSIS — Z0000 Encounter for general adult medical examination without abnormal findings: Secondary | ICD-10-CM | POA: Insufficient documentation

## 2013-05-30 DIAGNOSIS — R232 Flushing: Secondary | ICD-10-CM | POA: Insufficient documentation

## 2013-05-30 NOTE — Assessment & Plan Note (Signed)
Flu shot offered but she declined. I referred her to GI for colonoscopy. She will schedule appointment for mammogram in March. I offered to do her PAP test during this visit.She prefer PAP with her Gynecologist, she stated she would call for an appointment.

## 2013-05-30 NOTE — Assessment & Plan Note (Signed)
Epigastric and RLQ. Symptoms currently improved. Likely GERD for her epigastric pain and some element of IBS vs crohn's in the setting of RLQ pain with diarrhea alternating with constipation. R/O colon cancer even though no weight loss. Colonoscopy recommended since she is 53 y/o. I referred her to GI for further management and to obtain colonoscopy.

## 2013-05-30 NOTE — Assessment & Plan Note (Signed)
Likely onset of menopause. Different management option discussed. She opted for OTC regimen for now.

## 2013-05-30 NOTE — Assessment & Plan Note (Signed)
BP optimal. Continue Toprol XL 25 mg qd and Zestoretic 20/25 mg qd.

## 2013-06-09 ENCOUNTER — Other Ambulatory Visit: Payer: Self-pay | Admitting: Cardiology

## 2013-07-04 ENCOUNTER — Encounter: Payer: Self-pay | Admitting: Gastroenterology

## 2013-07-04 ENCOUNTER — Ambulatory Visit (INDEPENDENT_AMBULATORY_CARE_PROVIDER_SITE_OTHER): Payer: 59 | Admitting: Gastroenterology

## 2013-07-04 VITALS — BP 110/60 | HR 64 | Ht 64.5 in | Wt 194.0 lb

## 2013-07-04 DIAGNOSIS — R1013 Epigastric pain: Secondary | ICD-10-CM

## 2013-07-04 DIAGNOSIS — K3189 Other diseases of stomach and duodenum: Secondary | ICD-10-CM

## 2013-07-04 DIAGNOSIS — Z1211 Encounter for screening for malignant neoplasm of colon: Secondary | ICD-10-CM

## 2013-07-04 DIAGNOSIS — R109 Unspecified abdominal pain: Secondary | ICD-10-CM

## 2013-07-04 HISTORY — DX: Other diseases of stomach and duodenum: K31.89

## 2013-07-04 HISTORY — DX: Epigastric pain: R10.13

## 2013-07-04 MED ORDER — ESOMEPRAZOLE MAGNESIUM 40 MG PO CPDR: 40.0000 mg | DELAYED_RELEASE_CAPSULE | Freq: Every day | ORAL | Status: DC

## 2013-07-04 NOTE — Assessment & Plan Note (Signed)
  New onset of abdominal bloating, pressure and distention could be do to ulcer or nonulcer dyspepsia.  Vision has also noted some changes in bowel habits.  Recommendations #1 empiric trial of Nexium 40 mg daily; not improved I would consider upper endoscopy #2 colonoscopy both for symptoms (change in bowel habits) and for colorectal cancer screening

## 2013-07-04 NOTE — Patient Instructions (Addendum)
You have been scheduled for a colonoscopy with propofol. Please follow written instructions given to you at your visit today.  Please pick up your prep kit at the pharmacy within the next 1-3 days. If you use inhalers (even only as needed), please bring them with you on the day of your procedure. Your physician has requested that you go to www.startemmi.com and enter the access code given to you at your visit today. This web site gives a general overview about your procedure. However, you should still follow specific instructions given to you by our office regarding your preparation for the procedure.  Suprep sample given

## 2013-07-04 NOTE — Progress Notes (Signed)
_                                                                                                                History of Present Illness: [name] 53 year old Afro-American female with history of hypertrophic obstructive cardiomyopathy referred for evaluation of abdominal bloating and distention.  For several weeks she has noted both postprandial and spontaneous abdominal distention and bloating.  She has mild discomfort from this.  She denies nausea or pyrosis.  She's also noted a change in bowel habits.  She tends to alternate between diarrhea and constipation.  There is no history of bleeding.  She's had no change in medications or diet.    Past Medical History  Diagnosis Date  . Chest pain     cath 7/09: normal cors; EF 65%  . HTN (hypertension)   . Hyperlipidemia   . HOCM (hypertrophic obstructive cardiomyopathy)     echo 10/11: normal LVF; apical hypertropy; mod LAE; mild MR;      Holter 10/11: no NSVT;     ETT 10/11: no decreased BP with exercise   . Diastolic congestive heart failure    Past Surgical History  Procedure Laterality Date  . Tubal ligation    . Cardiac catheterization     family history includes Alcohol abuse in her father; Asthma in her brother; Cervical cancer in her maternal grandmother; Cirrhosis in her father. Current Outpatient Prescriptions  Medication Sig Dispense Refill  . aspirin 81 MG EC tablet Take 81 mg by mouth daily.        . cholecalciferol (VITAMIN D) 1000 UNITS tablet Take 1,000 Units by mouth daily.      . fish oil-omega-3 fatty acids 1000 MG capsule Take 2 g by mouth daily.        . furosemide (LASIX) 20 MG tablet TAKE 1 TABLET DAILY AS NEEDED  30 tablet  9  . lisinopril-hydrochlorothiazide (PRINZIDE,ZESTORETIC) 20-25 MG per tablet Take 1 tablet by mouth daily.  90 tablet  3  . metoprolol succinate (TOPROL-XL) 25 MG 24 hr tablet TAKE 1 TABLET (25 MG TOTAL) BY MOUTH DAILY.  30 tablet  5  . Multiple Vitamin (MULTIVITAMIN)  tablet Take 1 tablet by mouth daily.        . potassium chloride SA (K-DUR,KLOR-CON) 20 MEQ tablet Take 1 tablet (20 mEq total) by mouth daily. As needed with prn lasix.  Per cardiology  30 tablet  3   No current facility-administered medications for this visit.   Allergies as of 07/04/2013 - Review Complete 07/04/2013  Allergen Reaction Noted  . Codeine phosphate Rash 08/02/2005    reports that she has never smoked. She has never used smokeless tobacco. She reports that she does not drink alcohol or use illicit drugs.     Review of Systems: Pertinent positive and negative review of systems were noted in the above HPI section. All other review of systems were otherwise negative.  Vital signs were reviewed in today's medical record Physical Exam: General: Well developed ,  well nourished, no acute distress Skin: anicteric Head: Normocephalic and atraumatic Eyes:  sclerae anicteric, EOMI Ears: Normal auditory acuity Mouth: No deformity or lesions Neck: Supple, no masses or thyromegaly Lungs: Clear throughout to auscultation Heart: Regular rate and rhythm; no murmurs, rubs or bruits Abdomen: Soft, non tender and non distended. No masses, hepatosplenomegaly or hernias noted. Normal Bowel sounds Rectal:deferred Musculoskeletal: Symmetrical with no gross deformities  Skin: No lesions on visible extremities Pulses:  Normal pulses noted Extremities: No clubbing, cyanosis, edema or deformities noted Neurological: Alert oriented x 4, grossly nonfocal Cervical Nodes:  No significant cervical adenopathy Inguinal Nodes: No significant inguinal adenopathy Psychological:  Alert and cooperative. Normal mood and affect  See Assessment and Plan under Problem List

## 2013-07-11 ENCOUNTER — Encounter: Payer: Self-pay | Admitting: Gastroenterology

## 2013-08-15 ENCOUNTER — Ambulatory Visit (AMBULATORY_SURGERY_CENTER): Payer: 59 | Admitting: Gastroenterology

## 2013-08-15 ENCOUNTER — Encounter: Payer: Self-pay | Admitting: Gastroenterology

## 2013-08-15 VITALS — BP 119/76 | HR 64 | Temp 97.9°F | Resp 20 | Ht 64.5 in | Wt 194.0 lb

## 2013-08-15 DIAGNOSIS — D126 Benign neoplasm of colon, unspecified: Secondary | ICD-10-CM

## 2013-08-15 DIAGNOSIS — K5730 Diverticulosis of large intestine without perforation or abscess without bleeding: Secondary | ICD-10-CM

## 2013-08-15 DIAGNOSIS — Z1211 Encounter for screening for malignant neoplasm of colon: Secondary | ICD-10-CM

## 2013-08-15 MED ORDER — SODIUM CHLORIDE 0.9 % IV SOLN: 500.0000 mL | INTRAVENOUS | Status: DC

## 2013-08-15 NOTE — Progress Notes (Signed)
  [facility] Post-op Note  Patient: NYIMA VANACKER  Procedure(s) Performed: colonoscopy  Patient Location: [facility] - Recovery Area  Anesthesia Type: Deep Sedation/Propofol  Level of Consciousness: awake, oriented and patient cooperative  Airway and Oxygen Therapy: Patient Spontanous Breathing  Post-op Pain: none  Post-op Assessment:  Post-op Vital signs reviewed, Patient's Cardiovascular Status Stable, Respiratory Function Stable, Patent Airway, No signs of Nausea or vomiting and Pain level controlled  Post-op Vital Signs: Reviewed and stable  Complications: No apparent anesthesia complications  Verlin Grills 3:48 PM

## 2013-08-15 NOTE — Patient Instructions (Signed)
Information sheets given on polyps and diverticulosis.  YOU HAD AN ENDOSCOPIC PROCEDURE TODAY AT THE [facility]: Refer to the procedure report that was given to you for any specific questions about what was found during the examination.  If the procedure report does not answer your questions, please call your gastroenterologist to clarify.  If you requested that your care partner not be given the details of your procedure findings, then the procedure report has been included in a sealed envelope for you to review at your convenience later.  YOU SHOULD EXPECT: Some feelings of bloating in the abdomen. Passage of more gas than usual.  Walking can help get rid of the air that was put into your GI tract during the procedure and reduce the bloating. If you had a lower endoscopy (such as a colonoscopy or flexible sigmoidoscopy) you may notice spotting of blood in your stool or on the toilet paper. If you underwent a bowel prep for your procedure, then you may not have a normal bowel movement for a few days.  DIET: Your first meal following the procedure should be a light meal and then it is ok to progress to your normal diet.  A half-sandwich or bowl of soup is an example of a good first meal.  Heavy or fried foods are harder to digest and may make you feel nauseous or bloated.  Likewise meals heavy in dairy and vegetables can cause extra gas to form and this can also increase the bloating.  Drink plenty of fluids but you should avoid alcoholic beverages for 24 hours.  ACTIVITY: Your care partner should take you home directly after the procedure.  You should plan to take it easy, moving slowly for the rest of the day.  You can resume normal activity the day after the procedure however you should NOT DRIVE or use heavy machinery for 24 hours (because of the sedation medicines used during the test).    SYMPTOMS TO REPORT IMMEDIATELY: A gastroenterologist can be reached at any hour.  During normal  business hours, 8:30 AM to 5:00 PM Monday through Friday, call [phone].  After hours and on weekends, please call the GI answering service at [phone] who will take a message and have the physician on call contact you.   Following lower endoscopy (colonoscopy or flexible sigmoidoscopy):  Excessive amounts of blood in the stool  Significant tenderness or worsening of abdominal pains  Swelling of the abdomen that is new, acute  Fever of 100F or higher  FOLLOW UP: If any biopsies were taken you will be contacted by phone or by letter within the next 1-3 weeks.  Call your gastroenterologist if you have not heard about the biopsies in 3 weeks.  Our staff will call the home number listed on your records the next business day following your procedure to check on you and address any questions or concerns that you may have at that time regarding the information given to you following your procedure. This is a courtesy call and so if there is no answer at the home number and we have not heard from you through the emergency physician on call, we will assume that you have returned to your regular daily activities without incident.  SIGNATURES/CONFIDENTIALITY: You and/or your care partner have signed paperwork which will be entered into your electronic medical record.  These signatures attest to the fact that that the information above on your After Visit Summary has been reviewed and is understood.    Full responsibility of the confidentiality of this discharge information lies with you and/or your care-partner. 

## 2013-08-15 NOTE — Op Note (Signed)
[facility]  [address] [place], [address]   COLONOSCOPY PROCEDURE REPORT  PATIENT: Alyssa, [name]  MR#: [id number] BIRTHDATE: Mar 22, 1961 , 53  yrs. old GENDER: Female ENDOSCOPIST: Inda Castle, MD REFERRED BY: PROCEDURE DATE:  08/15/2013 PROCEDURE:   Colonoscopy with cold biopsy polypectomy First Screening Colonoscopy - Avg.  risk and is 50 yrs.  old or older Yes.  Prior Negative Screening - Now for repeat screening. N/A  History of Adenoma - Now for follow-up colonoscopy & has been > or = to 3 yrs.  N/A  Polyps Removed Today? Yes. ASA CLASS:   Class II INDICATIONS:average risk screening. MEDICATIONS: MAC sedation, administered by CRNA and propofol (Diprivan) 300mg  IV  DESCRIPTION OF PROCEDURE:   After the risks benefits and alternatives of the procedure were thoroughly explained, informed consent was obtained.  A digital rectal exam revealed no abnormalities of the rectum.   The [id number]  endoscope was introduced through the anus and advanced to the cecum, which was identified by both the appendix and ileocecal valve. No adverse events experienced.   The quality of the prep was Suprep good  The instrument was then slowly withdrawn as the colon was fully examined.      COLON FINDINGS: A sessile polyp measuring 2 mm in size was found at the cecum.  A polypectomy was performed with cold forceps.   Mild diverticulosis was noted in the transverse colon.   The colon was otherwise normal.  There was no diverticulosis, inflammation, polyps or cancers unless previously stated.  Retroflexed views revealed no abnormalities. The time to cecum=3 minutes 49 seconds. Withdrawal time=10 minutes 37 seconds.  The scope was withdrawn and the procedure completed. COMPLICATIONS: There were no complications.  ENDOSCOPIC IMPRESSION: 1.   Sessile polyp measuring 2 mm in size was found at the cecum; polypectomy was performed with cold forceps 2.   Mild  diverticulosis was noted in the transverse colon 3.   The colon was otherwise normal  RECOMMENDATIONS: 1.  If the polyp(s) removed today are proven to be adenomatous (pre-cancerous) polyps, you will need a repeat colonoscopy in 5 years.  Otherwise you should continue to follow colorectal cancer screening guidelines for "routine risk" patients with colonoscopy in 10 years.  You will receive a letter within 1-2 weeks with the results of your biopsy as well as final recommendations.  Please call my office if you have not received a letter after 3 weeks. 2.  my office will schedule upper endoscopy because of dyspepsia   eSigned:  Inda Castle, MD 08/15/2013 3:50 PM   cc: Andrena Mews, MD   PATIENT NAME:  Alyssa, [name] MR#: [id number]

## 2013-08-15 NOTE — Progress Notes (Signed)
Called to room to assist during endoscopic procedure.  Patient ID and intended procedure confirmed with present staff. Received instructions for my participation in the procedure from the performing physician.  

## 2013-08-18 ENCOUNTER — Telehealth: Payer: Self-pay

## 2013-08-18 NOTE — Telephone Encounter (Signed)
  Follow up Call-  Call back number 08/15/2013  Post procedure Call Back phone  # [phone]  Permission to leave phone message Yes     Patient questions:  Do you have a fever, pain , or abdominal swelling? no Pain Score  0 *  Have you tolerated food without any problems? yes  Have you been able to return to your normal activities? yes  Do you have any questions about your discharge instructions: Diet   no Medications  no Follow up visit  no  Do you have questions or concerns about your Care? no  Actions: * If pain score is 4 or above: No action needed, pain <4.   Per the pt no problems.  She said everyone who she spoke to was absolutly wonderful.  maw

## 2013-08-20 ENCOUNTER — Encounter: Payer: Self-pay | Admitting: Gastroenterology

## 2013-09-11 ENCOUNTER — Ambulatory Visit (INDEPENDENT_AMBULATORY_CARE_PROVIDER_SITE_OTHER): Payer: 59 | Admitting: Cardiology

## 2013-09-11 ENCOUNTER — Encounter: Payer: Self-pay | Admitting: *Deleted

## 2013-09-11 ENCOUNTER — Encounter: Payer: Self-pay | Admitting: Cardiology

## 2013-09-11 VITALS — BP 118/62 | HR 69 | Ht 65.0 in | Wt 198.0 lb

## 2013-09-11 DIAGNOSIS — I421 Obstructive hypertrophic cardiomyopathy: Secondary | ICD-10-CM

## 2013-09-11 DIAGNOSIS — I10 Essential (primary) hypertension: Secondary | ICD-10-CM

## 2013-09-11 DIAGNOSIS — I509 Heart failure, unspecified: Secondary | ICD-10-CM

## 2013-09-11 MED ORDER — FUROSEMIDE 20 MG PO TABS: 40.0000 mg | ORAL_TABLET | Freq: Every day | ORAL | Status: DC

## 2013-09-11 NOTE — Assessment & Plan Note (Signed)
Chronic diastolic congestive heart failure. Continue higher dose of Lasix as described under hypertrophic cardiomyopathy.

## 2013-09-11 NOTE — Assessment & Plan Note (Addendum)
Patient has apical variant hypertrophic cardiomyopathy. Continue beta blocker. She recently traveled to [facility] and developed increasing congestive heart failure symptoms. She increased her Lasix from 20 to 40 mg daily and there has been some improvement. We will continue that dose. Check potassium, renal function and BNP. She also sounds to have a resolving URI. I think it is likely that there was increased sodium intake while in [place] that may have precipitated the above. Repeat echocardiogram.

## 2013-09-11 NOTE — Assessment & Plan Note (Signed)
Blood pressure controlled. Continue present medications. 

## 2013-09-11 NOTE — Patient Instructions (Signed)
Your physician wants you to follow-up in: [place] will receive a reminder letter in the mail two months in advance. If you don't receive a letter, please call our office to schedule the follow-up appointment.   Your physician has requested that you have an echocardiogram. Echocardiography is a painless test that uses sound waves to create images of your heart. It provides your doctor with information about the size and shape of your heart and how well your heart's chambers and valves are working. This procedure takes approximately one hour. There are no restrictions for this procedure.   TAKE FUROSEMIDE 40 MG ONCE DAILY  Your physician recommends that you HAVE LAB WORK TODAY

## 2013-09-11 NOTE — Progress Notes (Signed)
HPI: Followup of apical hypertrophic cardiomyopathy. A myoview in 2009 showed EF 44%, mild mid and apical anterior wall ischemia. She had a normal cardiac catheterization in 2009 other than apical hypertrophy. A Holter monitor showed no nonsustained ventricular tachycardia. An exercise treadmill revealed no decrease in systolic blood pressure with exercise. Echo repeated in December of 2012. There was severe left ventricular hypertrophy and her ejection fraction was 65-70%. Mild mitral regurgitation. Moderately elevated pulmonary pressures. Also with history of diastolic congestive heart failure. Since she was last seen, She traveled to [facility] last weekend. She then noticed increasing dyspnea on exertion, orthopnea and pedal edema. She also complained of a cough productive of yellow sputum. No fevers or chills. She also noticed chest tightness.   Current Outpatient Prescriptions  Medication Sig Dispense Refill  . aspirin 81 MG EC tablet Take 81 mg by mouth daily.        . cholecalciferol (VITAMIN D) 1000 UNITS tablet Take 1,000 Units by mouth daily.      . fish oil-omega-3 fatty acids 1000 MG capsule Take 2 g by mouth daily.        . furosemide (LASIX) 20 MG tablet TAKE 1 TABLET DAILY AS NEEDED  30 tablet  9  . lisinopril-hydrochlorothiazide (PRINZIDE,ZESTORETIC) 20-25 MG per tablet Take 1 tablet by mouth daily.  90 tablet  3  . metoprolol succinate (TOPROL-XL) 25 MG 24 hr tablet TAKE 1 TABLET (25 MG TOTAL) BY MOUTH DAILY.  30 tablet  5  . Multiple Vitamin (MULTIVITAMIN) tablet Take 1 tablet by mouth daily.        . potassium chloride SA (K-DUR,KLOR-CON) 20 MEQ tablet Take 1 tablet (20 mEq total) by mouth daily. As needed with prn lasix.  Per cardiology  30 tablet  3   No current facility-administered medications for this visit.     Past Medical History  Diagnosis Date  . Chest pain     cath 7/09: normal cors; EF 65%  . HTN (hypertension)   . Hyperlipidemia   . HOCM (hypertrophic  obstructive cardiomyopathy)     echo 10/11: normal LVF; apical hypertropy; mod LAE; mild MR;      Holter 10/11: no NSVT;     ETT 10/11: no decreased BP with exercise   . Diastolic congestive heart failure   . Anemia   . GERD (gastroesophageal reflux disease)     Past Surgical History  Procedure Laterality Date  . Tubal ligation    . Cardiac catheterization      History   Social History  . Marital Status: Married    Spouse Name: N/A    Number of Children: 43  . Years of Education: N/A   Occupational History  . paralegal    Social History Main Topics  . Smoking status: Never Smoker   . Smokeless tobacco: Never Used  . Alcohol Use: No  . Drug Use: No  . Sexual Activity: Yes    Birth Control/ Protection: None   Other Topics Concern  . 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  Is planning on adopting 3 foster children (2012)          ROS: no fevers or chills, productive cough, hemoptysis, dysphasia, odynophagia, melena, hematochezia, dysuria, hematuria, rash, seizure activity, orthopnea, PND, pedal edema, claudication. Remaining systems are negative.  Physical  Exam: Well-developed well-nourished in no acute distress.  Skin is warm and dry.  HEENT is normal.  Neck is supple.  Chest is clear to auscultation with normal expansion.  Cardiovascular exam is regular rate and rhythm. 2/6 systolic murmur apex. Abdominal exam nontender or distended. No masses palpated. Extremities show no edema. neuro grossly intact  ECG Sinus rhythm at a rate of 69. Left ventricular hypertrophy with repolarization abnormality.

## 2013-09-12 LAB — BASIC METABOLIC PANEL
BUN: 16 mg/dL (ref 6–23)
CHLORIDE: 101 meq/L (ref 96–112)
CO2: 30 meq/L (ref 19–32)
Calcium: 9.5 mg/dL (ref 8.4–10.5)
Creatinine, Ser: 1 mg/dL (ref 0.4–1.2)
GFR: 74.27 mL/min (ref >60.00)
Glucose, Bld: 92 mg/dL (ref 70–99)
Potassium: 3.9 mEq/L (ref 3.5–5.1)
Sodium: 136 mEq/L (ref 135–145)

## 2013-09-12 LAB — BRAIN NATRIURETIC PEPTIDE: Pro B Natriuretic peptide (BNP): 334 pg/mL — ABNORMAL HIGH (ref 0.0–100.0)

## 2013-09-26 ENCOUNTER — Encounter: Payer: 59 | Admitting: Gastroenterology

## 2013-10-03 ENCOUNTER — Other Ambulatory Visit (HOSPITAL_COMMUNITY): Payer: 59

## 2013-10-23 ENCOUNTER — Ambulatory Visit (HOSPITAL_COMMUNITY): Payer: 59 | Attending: Cardiology | Admitting: Radiology

## 2013-10-23 DIAGNOSIS — I421 Obstructive hypertrophic cardiomyopathy: Secondary | ICD-10-CM

## 2013-10-23 DIAGNOSIS — R609 Edema, unspecified: Secondary | ICD-10-CM | POA: Insufficient documentation

## 2013-10-23 DIAGNOSIS — E785 Hyperlipidemia, unspecified: Secondary | ICD-10-CM | POA: Insufficient documentation

## 2013-10-23 DIAGNOSIS — R0989 Other specified symptoms and signs involving the circulatory and respiratory systems: Secondary | ICD-10-CM | POA: Insufficient documentation

## 2013-10-23 DIAGNOSIS — I428 Other cardiomyopathies: Secondary | ICD-10-CM | POA: Insufficient documentation

## 2013-10-23 DIAGNOSIS — I5030 Unspecified diastolic (congestive) heart failure: Secondary | ICD-10-CM

## 2013-10-23 DIAGNOSIS — I509 Heart failure, unspecified: Secondary | ICD-10-CM

## 2013-10-23 DIAGNOSIS — I079 Rheumatic tricuspid valve disease, unspecified: Secondary | ICD-10-CM | POA: Insufficient documentation

## 2013-10-23 DIAGNOSIS — I10 Essential (primary) hypertension: Secondary | ICD-10-CM | POA: Insufficient documentation

## 2013-10-23 DIAGNOSIS — I059 Rheumatic mitral valve disease, unspecified: Secondary | ICD-10-CM | POA: Insufficient documentation

## 2013-10-23 DIAGNOSIS — I517 Cardiomegaly: Secondary | ICD-10-CM | POA: Insufficient documentation

## 2013-10-23 DIAGNOSIS — R0609 Other forms of dyspnea: Secondary | ICD-10-CM | POA: Insufficient documentation

## 2013-10-23 NOTE — Progress Notes (Signed)
Echocardiogram performed.  

## 2013-10-31 ENCOUNTER — Encounter: Payer: 59 | Admitting: Gastroenterology

## 2013-11-02 ENCOUNTER — Other Ambulatory Visit: Payer: Self-pay | Admitting: Family Medicine

## 2013-11-06 ENCOUNTER — Ambulatory Visit (INDEPENDENT_AMBULATORY_CARE_PROVIDER_SITE_OTHER): Payer: 59 | Admitting: Cardiology

## 2013-11-06 ENCOUNTER — Encounter: Payer: Self-pay | Admitting: Cardiology

## 2013-11-06 VITALS — BP 112/67 | HR 60 | Ht 65.0 in | Wt 197.1 lb

## 2013-11-06 DIAGNOSIS — I421 Obstructive hypertrophic cardiomyopathy: Secondary | ICD-10-CM

## 2013-11-06 DIAGNOSIS — I509 Heart failure, unspecified: Secondary | ICD-10-CM

## 2013-11-06 DIAGNOSIS — I10 Essential (primary) hypertension: Secondary | ICD-10-CM

## 2013-11-06 DIAGNOSIS — I5032 Chronic diastolic (congestive) heart failure: Secondary | ICD-10-CM

## 2013-11-06 NOTE — Assessment & Plan Note (Signed)
Blood pressure controlled. Continue present medications. 

## 2013-11-06 NOTE — Assessment & Plan Note (Signed)
Continue present dose of diuretics as outlined under hypertrophic obstructive cardiomyopathy.

## 2013-11-06 NOTE — Assessment & Plan Note (Signed)
Patient has apical variant hypertrophic cardiomyopathy. Continue beta blocker. Since we increased her Lasix to 40 mg daily her volume status has improved and she feels much better. Continue at present dose. Take additional 20 mg daily as needed. I have explained that her children need to be screened. She does not have risk factors for sudden death.

## 2013-11-06 NOTE — Progress Notes (Signed)
HPI: Followup apical hypertrophic cardiomyopathy. A myoview in 2009 showed EF 44%, mild mid and apical anterior wall ischemia. She had a normal cardiac catheterization in 2009 other than apical hypertrophy. A Holter monitor showed no nonsustained ventricular tachycardia. An exercise treadmill revealed no decrease in systolic blood pressure with exercise. Echo repeated in July 2015. Ejection fraction 78-93%, grade 2 diastolic dysfunction, mild left atrial enlargement, mild mitral regurgitation, findings consistent with apical hypertrophic cardiomyopathy. Also with history of diastolic congestive heart failure. Since she was last seen, Her dyspnea has improved. She has dyspnea with more extreme activities but not routine activities. No orthopnea, PND, pedal edema, syncope or chest pain.   Current Outpatient Prescriptions  Medication Sig Dispense Refill  . aspirin 81 MG EC tablet Take 81 mg by mouth daily.        . cholecalciferol (VITAMIN D) 1000 UNITS tablet Take 1,000 Units by mouth daily.      . fish oil-omega-3 fatty acids 1000 MG capsule Take 2 g by mouth daily.        Marland Kitchen lisinopril-hydrochlorothiazide (PRINZIDE,ZESTORETIC) 20-25 MG per tablet TAKE 1 TABLET BY MOUTH DAILY.  90 tablet  3  . metoprolol succinate (TOPROL-XL) 25 MG 24 hr tablet TAKE 1 TABLET (25 MG TOTAL) BY MOUTH DAILY.  30 tablet  5  . Multiple Vitamin (MULTIVITAMIN) tablet Take 1 tablet by mouth daily.        . potassium chloride SA (K-DUR,KLOR-CON) 20 MEQ tablet Take 1 tablet (20 mEq total) by mouth daily. As needed with prn lasix.  Per cardiology  30 tablet  3   No current facility-administered medications for this visit.     Past Medical History  Diagnosis Date  . Chest pain     cath 7/09: normal cors; EF 65%  . HTN (hypertension)   . Hyperlipidemia   . HOCM (hypertrophic obstructive cardiomyopathy)     echo 10/11: normal LVF; apical hypertropy; mod LAE; mild MR;      Holter 10/11: no NSVT;     ETT 10/11: no  decreased BP with exercise   . Diastolic congestive heart failure   . Anemia   . GERD (gastroesophageal reflux disease)     Past Surgical History  Procedure Laterality Date  . Tubal ligation    . Cardiac catheterization      History   Social History  . Marital Status: Married    Spouse Name: N/A    Number of Children: 23  . Years of Education: N/A   Occupational History  . paralegal    Social History Main Topics  . Smoking status: Never Smoker   . Smokeless tobacco: Never Used  . Alcohol Use: No  . Drug Use: No  . Sexual Activity: Yes    Birth Control/ Protection: None   Other Topics Concern  . 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  Is planning on adopting 3 foster children (2012)          ROS: no fevers or chills, productive cough, hemoptysis, dysphasia, odynophagia, melena, hematochezia, dysuria, hematuria, rash, seizure activity, orthopnea, PND, pedal edema, claudication. Remaining systems are negative.  Physical Exam: Well-developed well-nourished in no acute distress.  Skin is warm and dry.  HEENT is normal.  Neck is supple.  Chest is clear to auscultation with normal expansion.  Cardiovascular exam is regular rate and rhythm.  Abdominal exam nontender or distended. No masses palpated. Extremities show no edema. neuro grossly intact

## 2013-11-06 NOTE — Patient Instructions (Signed)
Your physician wants you to follow-up in: 6 MONTHS WITH DR CRENSHAW You will receive a reminder letter in the mail two months in advance. If you don't receive a letter, please call our office to schedule the follow-up appointment.  

## 2013-11-13 ENCOUNTER — Other Ambulatory Visit: Payer: Self-pay

## 2013-11-13 DIAGNOSIS — Z1231 Encounter for screening mammogram for malignant neoplasm of breast: Secondary | ICD-10-CM

## 2013-11-27 ENCOUNTER — Ambulatory Visit
Admission: RE | Admit: 2013-11-27 | Discharge: 2013-11-27 | Disposition: A | Discharge status: Home / Self Care | Payer: 59 | Source: Ambulatory Visit

## 2013-11-27 DIAGNOSIS — Z1231 Encounter for screening mammogram for malignant neoplasm of breast: Secondary | ICD-10-CM

## 2013-12-01 ENCOUNTER — Other Ambulatory Visit: Payer: Self-pay | Admitting: *Deleted

## 2013-12-01 MED ORDER — METOPROLOL SUCCINATE ER 25 MG PO TB24: ORAL_TABLET | ORAL | Status: DC

## 2013-12-26 ENCOUNTER — Emergency Department (INDEPENDENT_AMBULATORY_CARE_PROVIDER_SITE_OTHER): Payer: 59

## 2013-12-26 ENCOUNTER — Emergency Department (HOSPITAL_COMMUNITY)
Admission: EM | Admit: 2013-12-26 | Discharge: 2013-12-26 | Disposition: A | Discharge status: Home / Self Care | Payer: 59 | Source: Home / Self Care | Attending: Family Medicine | Admitting: Family Medicine

## 2013-12-26 ENCOUNTER — Encounter (HOSPITAL_COMMUNITY): Payer: Self-pay | Admitting: Family Medicine

## 2013-12-26 ENCOUNTER — Emergency Department (HOSPITAL_COMMUNITY): Payer: 59

## 2013-12-26 DIAGNOSIS — M79609 Pain in unspecified limb: Secondary | ICD-10-CM

## 2013-12-26 DIAGNOSIS — M79672 Pain in left foot: Secondary | ICD-10-CM

## 2013-12-26 MED ORDER — KETOROLAC TROMETHAMINE 60 MG/2ML IM SOLN
INTRAMUSCULAR | Status: AC
  Filled 2013-12-26: qty 2

## 2013-12-26 MED ORDER — KETOROLAC TROMETHAMINE 60 MG/2ML IM SOLN
60.0000 mg | Freq: Once | INTRAMUSCULAR | Status: AC
  Administered 2013-12-26: 60 mg via INTRAMUSCULAR

## 2013-12-26 MED ORDER — TRAMADOL HCL 50 MG PO TABS: 50.0000 mg | ORAL_TABLET | Freq: Four times a day (QID) | ORAL | Status: DC | PRN

## 2013-12-26 MED ORDER — DICLOFENAC SODIUM 75 MG PO TBEC: 75.0000 mg | DELAYED_RELEASE_TABLET | Freq: Two times a day (BID) | ORAL | Status: DC

## 2013-12-26 NOTE — ED Provider Notes (Signed)
CSN: [id number]     Arrival date & time 12/26/13  1324 History   First MD Initiated Contact with Patient 12/26/13 1330     Chief Complaint  Patient presents with  . Foot Pain   (Consider location/radiation/quality/duration/timing/severity/associated sxs/prior Treatment) HPI  L foot pain: started 3 days ago. Tender to the touch. Worse w/ certain movements. Sharp. Ibuprofen 600 w/o relief. Ice w/o relief. Denies any trauma. Mild swelling. Denies any change in sensation or strength or ROM. Denies any recent changes to job, exercise, footwear. Lateral foot w/o radiation.   Past Medical History  Diagnosis Date  . Chest pain     cath 7/09: normal cors; EF 65%  . HTN (hypertension)   . Hyperlipidemia   . HOCM (hypertrophic obstructive cardiomyopathy)     echo 10/11: normal LVF; apical hypertropy; mod LAE; mild MR;      Holter 10/11: no NSVT;     ETT 10/11: no decreased BP with exercise   . Diastolic congestive heart failure   . Anemia   . GERD (gastroesophageal reflux disease)    Past Surgical History  Procedure Laterality Date  . Tubal ligation    . Cardiac catheterization     Family History  Problem Relation Age of Onset  . Alcohol abuse Father   . Cirrhosis Father   . Asthma Brother   . Cervical cancer Maternal Grandmother    History  Substance Use Topics  . Smoking status: Never Smoker   . Smokeless tobacco: Never Used  . Alcohol Use: No   OB History   Grav Para Term Preterm Abortions TAB SAB Ect Mult Living                 Review of Systems Per HPI with all other pertinent systems negative.   Allergies  Codeine phosphate  Home Medications   Prior to Admission medications   Medication Sig Start Date End Date Taking? Authorizing Provider  aspirin 81 MG EC tablet Take 81 mg by mouth daily.     Yes Historical Provider, MD  lisinopril-hydrochlorothiazide (PRINZIDE,ZESTORETIC) 20-25 MG per tablet TAKE 1 TABLET BY MOUTH DAILY.   Yes Andrena Mews, MD  metoprolol  succinate (TOPROL-XL) 25 MG 24 hr tablet TAKE 1 TABLET (25 MG TOTAL) BY MOUTH DAILY. 12/01/13  Yes Lelon Perla, MD  cholecalciferol (VITAMIN D) 1000 UNITS tablet Take 1,000 Units by mouth daily.    Historical Provider, MD  diclofenac (VOLTAREN) 75 MG EC tablet Take 1 tablet (75 mg total) by mouth 2 (two) times daily. 12/26/13   Waldemar Dickens, MD  fish oil-omega-3 fatty acids 1000 MG capsule Take 2 g by mouth daily.      Historical Provider, MD  Multiple Vitamin (MULTIVITAMIN) tablet Take 1 tablet by mouth daily.      Historical Provider, MD  potassium chloride SA (K-DUR,KLOR-CON) 20 MEQ tablet Take 1 tablet (20 mEq total) by mouth daily. As needed with prn lasix.  Per cardiology 04/23/13   Lelon Perla, MD  traMADol (ULTRAM) 50 MG tablet Take 1 tablet (50 mg total) by mouth every 6 (six) hours as needed. 12/26/13   Waldemar Dickens, MD   BP 120/77  Pulse 82  Temp(Src) 98.8 F (37.1 C) (Oral)  Resp 12  SpO2 99%  LMP 12/23/2013 Physical Exam  Constitutional: She is oriented to person, place, and time. She appears well-developed and well-nourished. No distress.  HENT:  Head: Normocephalic and atraumatic.  Eyes: EOM are normal. Pupils are equal,  round, and reactive to light.  Neck: Normal range of motion.  Cardiovascular: Normal rate.   No murmur heard. Pulmonary/Chest: Effort normal and breath sounds normal. No respiratory distress.  Musculoskeletal:  Ankles bilat FROM. ttp and some soft tissue swelling alont the proximal lateral dorsum of the L foot. No induration. No wound. No mass felt. Sensation intact   Neurological: She is alert and oriented to person, place, and time.  Skin: Skin is warm and dry. She is not diaphoretic.  Psychiatric: She has a normal mood and affect. Her behavior is normal. Judgment and thought content normal.    ED Course  Procedures (including critical care time) Labs Review Labs Reviewed - No data to display  Imaging Review Dg Foot Complete  Left  12/26/2013   CLINICAL DATA:  Left foot pain.  EXAM: LEFT FOOT - COMPLETE 3+ VIEW  COMPARISON:  None.  FINDINGS: There is no evidence of fracture or dislocation. There is no evidence of arthropathy or other focal bone abnormality. Soft tissues are unremarkable.  IMPRESSION: Negative.   Electronically Signed   By: Rolm Baptise M.D.   On: 12/26/2013 14:04     MDM   1. Left foot pain    Etiology of foot pain is not clear.  Possibly due to small synovial cyst noted on ultrasound that is pushing on the extensor brevis which was edematous on xray.  Xray neg. Gout and stress fracture unlikely.  Toradol 60mg  IM given Start elevation, Ice adn rest Range of motion exercises Cam Walker boot during ambulation F/u Dr. Alfonso Ramus at [facility]. +/- MRI Precautions given and all questions answered  Linna Darner, MD Family Medicine 12/26/2013, 2:43 PM      Waldemar Dickens, MD 12/26/13 [phone]

## 2013-12-26 NOTE — ED Provider Notes (Signed)
Limited musculoskeletal ultrasound of the left foot: The area of the proximal lateral foot near the extensor digitorum brevis muscle and tendon is swollen and tender.  The probe was placed over this area. The tendon structure is intact without any definitive tears. Where there appears to be hypoechoic fluid tracking along the tendon sheath. Additionally there is a 1 cm cystic structure deep to the muscle and tendon.  There is blood flow along the cystic structure but not within the cystic structure on Doppler exam. The tendon structure appears to arise from the tarsal joint.  The cystic structure does not appear to be loculated. The cyst is most consistent with synovial cyst.  Hardin Negus, MD 12/26/13 1438

## 2013-12-26 NOTE — Discharge Instructions (Signed)
Thank you for coming in today The cause of your foot pain is not clear There is no clear evidence of a fracture or torn tendon The ultrasound showed inflammation and swelling of the extensor brevis of the foot with a possible small cyst underneath. This may be causing the pain and swelling. This may resolve by itself but please call [facility] to set up a follow up appointment with Dr. Alfonso Ramus. You should see him in the next 2 weeks In the meantime, wear the boot while walking. Apply ice for the next 48 hours for 30 minute intervals 2-4 times a day.  In 24 hours start the voltaren pills Use the tramadol for extreme pain only.  Perform non weight bearing range of motion exercises several times per day.

## 2013-12-26 NOTE — ED Notes (Signed)
C/o left foot pain onset Tuesday night; knot on top of foot Pain increases w/activity; denies inj/trauma Ambulated w/steady gait; NAD Alert, no signs of acute distress.

## 2014-01-08 ENCOUNTER — Encounter: Payer: Self-pay | Admitting: Podiatry

## 2014-01-08 ENCOUNTER — Ambulatory Visit (INDEPENDENT_AMBULATORY_CARE_PROVIDER_SITE_OTHER): Payer: 59 | Admitting: Podiatry

## 2014-01-08 VITALS — BP 116/55 | HR 80 | Resp 13 | Ht 65.0 in | Wt 192.0 lb

## 2014-01-08 DIAGNOSIS — M79609 Pain in unspecified limb: Secondary | ICD-10-CM

## 2014-01-08 DIAGNOSIS — M6740 Ganglion, unspecified site: Secondary | ICD-10-CM

## 2014-01-08 DIAGNOSIS — M79672 Pain in left foot: Secondary | ICD-10-CM

## 2014-01-08 NOTE — Patient Instructions (Signed)
F/u after MRI

## 2014-01-08 NOTE — Progress Notes (Signed)
   Subjective:    Patient ID: ELFRIDA PIXLEY, female    DOB: 10-08-1960, 54 y.o.   MRN: [id number]  HPI Comments: Ms. Kirley, 53 year old female, presents the office today for left foot pain. She states she had developed a soft tissue mass in the dorsal lateral aspect of her left foot approximately 3 weeks ago. She was seen at the urgent care and which x-rays were obtained and he performed an ultrasound which revealed a small synovial cyst abutting the extensor brevis tendon. She states that her pain is decreased is taking Voltaren. She has difficulty wearing shoes due to pressure over the area. Pain directly on palpation over the mass. It has not changed in size. Denies any injury. No other complaints at this time.    Foot Pain      Review of Systems  All other systems reviewed and are negative.      Objective:   Physical Exam AAO x3, NAD DP/PT pulses palpable 2/4 b/l. CRT < 3sec To sensation intact with Derrel Nip monofilament, vibratory sensation intact, Achilles tendon reflex intact. Approximately 2 x 2 cm fluid-filled mobile soft soft tissue lesion on the dorsal lateral aspect of the left foot. Overlying skin intact without any skin changes. Mild tenderness directly over the soft tissue mass. No pinpoint bony tenderness or pain the vibratory sensation. MMT 5/5, ROM WNL No leg pain, swelling, warmth.       Assessment & Plan:  53 year old female with left dorsal lateral soft tissue mass, likely ganglion versus synovial cyst. -X-rays and records reviewed for urgent care. -Conservative versus surgical treatment were discussed with patient including alternatives, risks, complications. -Discussed possible aspiration of the cyst and possible steroid injection. Patient like to proceed with an MRI to further evaluate before proceeding with aspiration. A prescription for an MRI of the left foot with and without contrast was sent to [facility] imaging. -Discussed shoe gear  modifications at take pressure off of the soft tissue mass which included releaing of his shoes.  -Followup after the MRI is complete. Call sooner any questions or concerns or change in symptoms.

## 2014-01-15 ENCOUNTER — Ambulatory Visit
Admission: RE | Admit: 2014-01-15 | Discharge: 2014-01-15 | Disposition: A | Discharge status: Home / Self Care | Payer: 59 | Source: Ambulatory Visit | Attending: Podiatry | Admitting: Podiatry

## 2014-01-15 DIAGNOSIS — M6740 Ganglion, unspecified site: Secondary | ICD-10-CM

## 2014-01-15 DIAGNOSIS — M79672 Pain in left foot: Secondary | ICD-10-CM

## 2014-01-15 MED ORDER — GADOBENATE DIMEGLUMINE 529 MG/ML IV SOLN
18.0000 mL | Freq: Once | INTRAVENOUS | Status: AC | PRN
  Administered 2014-01-15: 18 mL via INTRAVENOUS

## 2014-01-23 ENCOUNTER — Encounter: Payer: Self-pay | Admitting: Podiatry

## 2014-01-23 ENCOUNTER — Ambulatory Visit (INDEPENDENT_AMBULATORY_CARE_PROVIDER_SITE_OTHER): Payer: 59 | Admitting: Podiatry

## 2014-01-23 VITALS — BP 126/63 | HR 63 | Resp 13

## 2014-01-23 DIAGNOSIS — M6740 Ganglion, unspecified site: Secondary | ICD-10-CM

## 2014-01-23 NOTE — Patient Instructions (Signed)

## 2014-01-24 DIAGNOSIS — M6740 Ganglion, unspecified site: Secondary | ICD-10-CM | POA: Insufficient documentation

## 2014-01-24 HISTORY — DX: Ganglion, unspecified site: M67.40

## 2014-01-24 NOTE — Progress Notes (Signed)
Patient ID: [name], female   DOB: March 30, 1961, 53 y.o.   MRN: [id number]  Subjective: [name], 53 year old female, returns the office discussed MRI results. She states that she continues to have mild discomfort over the left foot. She has had to change her shoegear to take pressure off of the area. No acute changes since last appointment. No other complaints at this time.  Objective: AAO x3, NAD DP/PT pulses palpable 2/4 b/l. CRT < 3 sec Neurological status unchanged. Mild tenderness over the dorsal lateral aspect of the left foot overlying the area soft tissue mass. Soft tissue mass appears to be fluid-filled and well encapsulated and it is mobile. Overlying skin intact. No open lesions. MMT 5/5, ROM WNL No calf pain, warmth.  Assessment: 53 year old female with left foot ganglion cyst.  Plan: - MRI results were reviewed with the patient a copy was given to her. -Discussed various treatment options including aspiration, steroid injection, surgical excision. At this time the patient wishes to consider surgical intervention. I discussed with her that if she wishes to proceed with surgery she would need medical clearance from her primary care physician. -Followup as needed. Call the office with any questions, concerns, change in symptoms.

## 2014-02-02 ENCOUNTER — Other Ambulatory Visit: Payer: Self-pay | Admitting: *Deleted

## 2014-02-02 NOTE — Telephone Encounter (Signed)
Please have patient come see me to discuss her pain and pain medicine, I will not want for her to be on Voltaren for a long time due to hx of CHF and HTN, she is also on ASA which can trigger PUD. Please have her schedule follow up with me. Thanks.

## 2014-02-02 NOTE — Telephone Encounter (Signed)
Spoke with pt regarding refill on the medication.  Pt stated she received this medication from urgent care, but since she has seen a specialist.  She will need surgery on her foot; medication is not needed.  Derl Barrow, RN

## 2014-02-04 ENCOUNTER — Other Ambulatory Visit: Payer: Self-pay | Admitting: *Deleted

## 2014-02-04 NOTE — Telephone Encounter (Signed)
[name], you mentioned yesterday she does not need Voltaren any longer. If needing refill please have her schedule follow up with me. She is on ASA, and will not like for her to be on Voltaren for a long time.

## 2014-02-17 ENCOUNTER — Other Ambulatory Visit: Payer: Self-pay | Admitting: Cardiology

## 2014-04-15 ENCOUNTER — Telehealth: Payer: Self-pay | Admitting: Cardiology

## 2014-04-15 NOTE — Telephone Encounter (Signed)
Pt's OBGYN called today to report pt had been seen in their office and had reported recently symptoms of occasional SOB and chest tightness. Patient had left their office by time of return phone call.   I spoke with patient at length regarding her symptoms.  She states SOB 2-3 times a week, brief in duration ~5 minutes, and not associated with exertion. This occasionally happens while she is at rest but is not limited to a certain position. The episodes are sometimes accompanied by chest tightness Pt reports that she takes lasix daily, taking an extra dose resolves her symptoms, and that she'd been advised on this in the past by Dr. Stanford Breed.  Pt reports that these symptoms seem to be aggravated in relation to menstrual cycle.  I offered to move her up to see a MLP sooner than her scheduled appt with Dr. Stanford Breed.   She refused and will keep appt for 05/15/14. She will call with worsening changes. I will call patient back with any recommendations by provider.  Hx: HTN, HOCM, CHF

## 2014-04-15 NOTE — Telephone Encounter (Signed)
[name] called in wanting to know if this pt could be seen. She is complaining of SOB, chest pain , has high BP ,and CHF. Please call  Thanks

## 2014-04-18 ENCOUNTER — Emergency Department (HOSPITAL_COMMUNITY): Payer: 59

## 2014-04-18 ENCOUNTER — Encounter (HOSPITAL_COMMUNITY): Payer: Self-pay | Admitting: Adult Health

## 2014-04-18 ENCOUNTER — Inpatient Hospital Stay (HOSPITAL_COMMUNITY)
Admission: EM | Admit: 2014-04-18 | Discharge: 2014-04-22 | DRG: 287 | Disposition: A | Discharge status: Home / Self Care | Payer: 59 | Attending: Family Medicine | Admitting: Family Medicine

## 2014-04-18 DIAGNOSIS — I422 Other hypertrophic cardiomyopathy: Secondary | ICD-10-CM | POA: Diagnosis not present

## 2014-04-18 DIAGNOSIS — R931 Abnormal findings on diagnostic imaging of heart and coronary circulation: Secondary | ICD-10-CM | POA: Diagnosis present

## 2014-04-18 DIAGNOSIS — R0602 Shortness of breath: Secondary | ICD-10-CM | POA: Diagnosis not present

## 2014-04-18 DIAGNOSIS — R0789 Other chest pain: Secondary | ICD-10-CM

## 2014-04-18 DIAGNOSIS — Z0389 Encounter for observation for other suspected diseases and conditions ruled out: Secondary | ICD-10-CM

## 2014-04-18 DIAGNOSIS — R7989 Other specified abnormal findings of blood chemistry: Secondary | ICD-10-CM | POA: Diagnosis present

## 2014-04-18 DIAGNOSIS — D649 Anemia, unspecified: Secondary | ICD-10-CM | POA: Diagnosis present

## 2014-04-18 DIAGNOSIS — Z7982 Long term (current) use of aspirin: Secondary | ICD-10-CM

## 2014-04-18 DIAGNOSIS — N920 Excessive and frequent menstruation with regular cycle: Secondary | ICD-10-CM | POA: Diagnosis present

## 2014-04-18 DIAGNOSIS — K219 Gastro-esophageal reflux disease without esophagitis: Secondary | ICD-10-CM | POA: Diagnosis present

## 2014-04-18 DIAGNOSIS — I421 Obstructive hypertrophic cardiomyopathy: Secondary | ICD-10-CM | POA: Diagnosis present

## 2014-04-18 DIAGNOSIS — E876 Hypokalemia: Secondary | ICD-10-CM | POA: Diagnosis present

## 2014-04-18 DIAGNOSIS — R079 Chest pain, unspecified: Secondary | ICD-10-CM | POA: Diagnosis present

## 2014-04-18 DIAGNOSIS — F419 Anxiety disorder, unspecified: Secondary | ICD-10-CM | POA: Diagnosis present

## 2014-04-18 DIAGNOSIS — R778 Other specified abnormalities of plasma proteins: Secondary | ICD-10-CM | POA: Diagnosis present

## 2014-04-18 DIAGNOSIS — E785 Hyperlipidemia, unspecified: Secondary | ICD-10-CM | POA: Diagnosis present

## 2014-04-18 DIAGNOSIS — IMO0001 Reserved for inherently not codable concepts without codable children: Secondary | ICD-10-CM

## 2014-04-18 DIAGNOSIS — I5032 Chronic diastolic (congestive) heart failure: Secondary | ICD-10-CM | POA: Diagnosis present

## 2014-04-18 DIAGNOSIS — I10 Essential (primary) hypertension: Secondary | ICD-10-CM | POA: Diagnosis present

## 2014-04-18 LAB — BASIC METABOLIC PANEL
ANION GAP: 6 (ref 5–15)
BUN: 13 mg/dL (ref 6–23)
CO2: 25 mmol/L (ref 19–32)
Calcium: 8.9 mg/dL (ref 8.4–10.5)
Chloride: 108 mEq/L (ref 96–112)
Creatinine, Ser: 0.99 mg/dL (ref 0.50–1.10)
GFR calc Af Amer: 75 mL/min — ABNORMAL LOW (ref >90)
GFR, EST NON AFRICAN AMERICAN: 65 mL/min — AB (ref >90)
Glucose, Bld: 111 mg/dL — ABNORMAL HIGH (ref 70–99)
Potassium: 3.4 mmol/L — ABNORMAL LOW (ref 3.5–5.1)
SODIUM: 139 mmol/L (ref 135–145)

## 2014-04-18 LAB — CBC
HCT: 31.8 % — ABNORMAL LOW (ref 36.0–46.0)
Hemoglobin: 10.4 g/dL — ABNORMAL LOW (ref 12.0–15.0)
MCH: 25.4 pg — ABNORMAL LOW (ref 26.0–34.0)
MCHC: 32.7 g/dL (ref 30.0–36.0)
MCV: 77.8 fL — ABNORMAL LOW (ref 78.0–100.0)
PLATELETS: 228 10*3/uL (ref 150–400)
RBC: 4.09 MIL/uL (ref 3.87–5.11)
RDW: 16 % — ABNORMAL HIGH (ref 11.5–15.5)
WBC: 11.8 10*3/uL — AB (ref 4.0–10.5)

## 2014-04-18 LAB — TROPONIN I: Troponin I: 0.08 ng/mL — ABNORMAL HIGH (ref <0.031)

## 2014-04-18 LAB — D-DIMER, QUANTITATIVE: D-Dimer, Quant: <0.27 ug{FEU}/mL (ref 0.00–0.48)

## 2014-04-18 LAB — BRAIN NATRIURETIC PEPTIDE: B NATRIURETIC PEPTIDE 5: 370.4 pg/mL — AB (ref 0.0–100.0)

## 2014-04-18 LAB — I-STAT TROPONIN, ED: Troponin i, poc: 0.04 ng/mL (ref 0.00–0.08)

## 2014-04-18 MED ORDER — NITROGLYCERIN 0.4 MG SL SUBL
0.4000 mg | SUBLINGUAL_TABLET | SUBLINGUAL | Status: DC | PRN
  Administered 2014-04-18 (×2): 0.4 mg via SUBLINGUAL
  Filled 2014-04-18: qty 1

## 2014-04-18 MED ORDER — POTASSIUM CHLORIDE CRYS ER 20 MEQ PO TBCR
40.0000 meq | EXTENDED_RELEASE_TABLET | Freq: Once | ORAL | Status: AC
  Administered 2014-04-18: 40 meq via ORAL
  Filled 2014-04-18: qty 2

## 2014-04-18 MED ORDER — ASPIRIN 81 MG PO CHEW
324.0000 mg | CHEWABLE_TABLET | Freq: Once | ORAL | Status: AC
  Administered 2014-04-18: 324 mg via ORAL
  Filled 2014-04-18: qty 4

## 2014-04-18 NOTE — H&P (Signed)
[facility] Admission History and Physical Service Pager: [phone]  Patient name: [name] Medical record number: [id number] Date of birth: [birth year] Age: 53 y.o. Gender: female  Primary Care Provider: Andrena Mews, MD Consultants: None Code Status: Full  Chief Complaint: Chest Pain and Shortness of Breath on Exertion  Assessment and Plan: [name] is a 53 y.o. female presenting with chest pain and shortness of breath on exertion. PMH is significant for dCHF grade II, HOCM, anxiety, HTN, anemia, and GERD.  # Chest Pain: Crushing left sided chest pain with radiation into left neck associated with shortness of breath. Pain with palpation of left chest. Heart Score 5. Last echo 10/2013 showed vigrous LV function with severe apical hypertrophy, grade 2 diastolic dysfunction, mild left atrial enlargement, mild mitral regurgitation, and findings consistent with apical hypertrophic cardiomyopathy. - Placed in observation with telemetry, [place] attending. - Has taken 40mg  Lasix x4 prior to presentation to ED (160mg  total) - EKG- unchanged with biatrial enlargement and left ventricular hypertrophy with repolarization abnormality  - F/U EKG in am - Troponin 0.04>0.08. Will cycle troponins and monitor closely. - BNP 370.4 - D-Dimer <0.27 - CXR- mild bronchitic changes - Risk stratification: Lipid Panel, A1C, and TSH in am - Aspirin 325mg  - Morphine q2hr PRN - Nitroglycerin SL 0.4mg  q17min PRN - GI Cocktail (Maalox, Lidocaine, Donnatal) q4 PRN - Considering quality of chest pain and troponin, Cardiology has been consulted- we greatly appreciate their recommendations and management  - after discussion with them started Nitro drip and admit to step down.   - ACS protocol started    # HTN- Home Medications: Lisinopril-HCTZ 20-25mg  - Continue home medications - Well controlled today. BP 114/56. - Will continue to monitor.  # H/O Hypokalemia-  Home Medication Klor-Con 11mEq daily with PRN lasix - Potassium 3.4  - KDur 59mEq x1 - Will monitor with BMP in am  # Vaginal Bleeding- Recently started on Norethindrone 5mg  on 04/15/14 - Will continue home medications  FEN/GI: NPO, Saline Lock, Zofran PRN Prophylaxis: Subcutaneous Heparin  Disposition: Place in Observation, [place] attending.   History of Present Illness: [name] is a 53 y.o. female presenting with shortness of breath and left sided sharp chest pain with exertion. States she has been experiencing symptoms intermittently for approximately one week. States she could take about 5 steps and become short of breath. Pain became more severe yesterday. Notes that she walked up 10 stairs to her home today and developed sudden onset left sided chest pain with shortness of breath as well as lightheadedness. She took Aspirin 81mg  without relief. States that she has also taken four separate doses of her Lasix 40mg  since she feels fluid overloaded and was wheezing, however she has not urinated today. She has noted increased edema, especially in her legs and hands. Denies diaphoresis, nausea, or vomiting. States pain is located on the left side of her chest and radiates up into her left neck and over her shoulder; denies radiation into left arm. States she has experienced similar pain before, however it is only occasionally and has never lasted this long. Also complains of productive cough since yesterday. States she was given Nitroglycerin in the ED, which helped with the pain but did not eliminate it.   States she recently went to her OB/Gyn for vaginal bleeding on Wednesday, December 23. States she has had vaginal bleeding daily since Thanksgiving. She believes the bleeding is associated with her chest pain, because she has  chest pain with episodes of heavy bleeding. She was started on Norethindrone, which she feels has helped with the bleeding. States she was considering  hysterectomy, however she wants to be cleared by her cardiologist prior to surgery.   Review Of Systems: Per HPI  Otherwise 12 point review of systems was performed and was unremarkable.  Patient Active Problem List   Diagnosis Date Noted  . Exertional chest pain 04/18/2014  . Ganglion cyst 01/24/2014  . Dyspepsia and other specified disorders of function of stomach 07/04/2013  . Abdominal pain, unspecified site 05/30/2013  . Hot flashes 05/30/2013  . Healthcare maintenance 05/30/2013  . Congestive heart failure 04/04/2011  . Obesity 08/31/2010  . 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 Leg cramps 07/20/2010  . Hypertrophic obstructive cardiomyopathy(425.11) 01/28/2010  . SYNCOPE 01/28/2010  . Unspecified vitamin D deficiency 12/28/2009  . CHEST PAIN 09/08/2009  . ANXIETY 06/21/2006  . HYPERTENSION, BENIGN SYSTEMIC 06/21/2006  . MENORRHAGIA 06/21/2006   Past Medical History: Past Medical History  Diagnosis Date  . Chest pain     cath 7/09: normal cors; EF 65%  . HTN (hypertension)   . Hyperlipidemia   . HOCM (hypertrophic obstructive cardiomyopathy)     echo 10/11: normal LVF; apical hypertropy; mod LAE; mild MR;      Holter 10/11: no NSVT;     ETT 10/11: no decreased BP with exercise   . Diastolic congestive heart failure   . Anemia   . GERD (gastroesophageal reflux disease)    Past Surgical History: Past Surgical History  Procedure Laterality Date  . Tubal ligation    . Cardiac catheterization     Social History: History  Substance Use Topics  . Smoking status: Never Smoker   . Smokeless tobacco: Never Used  . Alcohol Use: No   Please also refer to relevant sections of EMR.  Family History: Family History  Problem Relation Age of Onset  . Alcohol abuse Father   . Cirrhosis Father   . Asthma Brother   . Cervical cancer Maternal Grandmother    Allergies and Medications: Allergies  Allergen Reactions  . Codeine Phosphate Rash    itching   No current  facility-administered medications on file prior to encounter.   Current Outpatient Prescriptions on File Prior to Encounter  Medication Sig Dispense Refill  . aspirin 81 MG EC tablet Take 81 mg by mouth daily.      Marland Kitchen lisinopril-hydrochlorothiazide (PRINZIDE,ZESTORETIC) 20-25 MG per tablet TAKE 1 TABLET BY MOUTH DAILY. 90 tablet 3  . metoprolol succinate (TOPROL-XL) 25 MG 24 hr tablet TAKE 1 TABLET (25 MG TOTAL) BY MOUTH DAILY. 30 tablet 4  . Multiple Vitamin (MULTIVITAMIN) tablet Take 1 tablet by mouth daily.      . diclofenac (VOLTAREN) 75 MG EC tablet Take 1 tablet (75 mg total) by mouth 2 (two) times daily. (Patient not taking: Reported on 04/18/2014) 60 tablet 0  . KLOR-CON M20 20 MEQ tablet TAKE 1 TABLET (20 MEQ TOTAL) BY MOUTH DAILY. AS NEEDED WITH PRN LASIX. PER CARDIOLOGY (Patient not taking: Reported on 04/18/2014) 30 tablet 3  . traMADol (ULTRAM) 50 MG tablet Take 1 tablet (50 mg total) by mouth every 6 (six) hours as needed. (Patient not taking: Reported on 04/18/2014) 15 tablet 0    Objective: BP 114/56 mmHg  Pulse 77  Temp(Src) 97.9 F (36.6 C) (Oral)  Resp 19  Ht 5\' 5"  (1.651 m)  Wt 197 lb (89.359 kg)  BMI 32.78 kg/m2  SpO2 100% Exam:  General: 53yo female resting comfortably in no apparent distress HEENT: PERRLA, mucous membranes moist Cardiovascular: S1 and S2 noted, regular rate and rhythm, systolic murmur noted Respiratory: No increased work of breathing noted. Mild crackles in bases noted. Abdomen: Bowel sounds noted. Soft and nondistended. No tenderness or masses to palpation. Extremities: 2+ pitting edema lower extremities bilaterally. Pulses palpated. Skin: No rashes noted. Warm and dry. Neuro: No focal deficits noted.   Labs and Imaging: CBC BMET   Recent Labs Lab 04/18/14 1721  WBC 11.8*  HGB 10.4*  HCT 31.8*  PLT 228    Recent Labs Lab 04/18/14 1721  NA 139  K 3.4*  CL 108  CO2 25  BUN 13  CREATININE 0.99  GLUCOSE 111*  CALCIUM 8.9    -  Troponin 0.04>0.08 - D Dimer <0.27 - BNP 370.4  EKG: NSR, LVH, repolarization abnormality which is exaggerated from previous tracings  Dg Chest 2 View  04/18/2014   CLINICAL DATA:  Shortness of breath, chest pain with exertion for 1 week. History of CHF, cardiomyopathy, hypertension,  EXAM: CHEST  2 VIEW  COMPARISON:  12/21/2011  FINDINGS: Heart is borderline in size. Mild peribronchial thickening. No confluent opacities or effusions. No acute bony abnormality.  IMPRESSION: Borderline heart size.  Mild bronchitic changes.   Electronically Signed   By: Rolm Baptise M.D.   On: 04/18/2014 20:01   Lorna Few, DO 04/18/2014, 10:08 PM PGY-1, [place] Intern pager: [phone], text pages welcome   I have seen and examined Ms. Burkes with Dr. Gerlean Ren and I agree with her documentation above. My annotations are in [place].   Laroy Apple, MD [place] Resident, PGY-3 04/19/2014, 1:03 AM

## 2014-04-18 NOTE — ED Notes (Signed)
Call [place] at [phone] for report

## 2014-04-18 NOTE — ED Provider Notes (Signed)
CSN: [id number]     Arrival date & time 04/18/14  1702 History   First MD Initiated Contact with Patient 04/18/14 1820     Chief Complaint  Patient presents with  . Chest Pain     (Consider location/radiation/quality/duration/timing/severity/associated sxs/prior Treatment) HPI  [name] Is a 53 year old female with a past medical history of chest pain, hypertension, hypertrophic obstructive cardiomyopathy, last EF showed 65-70%, however, she does have grade 3 diastolic heart failure. The patient states that over the past 4 days she has had chest pain with exertion and associated dyspnea on exertion. It is relieved by rest. She states she has had intermittent feelings of heaviness. She denies symptoms of URI. Patient states that yesterday her pain became more frequent and steady. She states that she went to the bathroom and when she stood up to leave, she had sudden onset severe chest pressure, pain, severe shortness of breath and a feeling like "a Of Bricks Was on My Chest." She Denies a History of Diabetes or Smoking. She Has No Family History of MI. Patient Took a Baby Aspirin This Morning, but Has Had No Other Medications Today. The Patient Is Also Currently Taking Norethindrone for Her Menorrhagia.[name] denies history of PE or DVT, has not history of cancer treatment, recent immobilization, recent trauma or surgery, denies hemoptysis, and has no unilateral leg swelling.   Past Medical History  Diagnosis Date  . Chest pain     cath 7/09: normal cors; EF 65%  . HTN (hypertension)   . Hyperlipidemia   . HOCM (hypertrophic obstructive cardiomyopathy)     echo 10/11: normal LVF; apical hypertropy; mod LAE; mild MR;      Holter 10/11: no NSVT;     ETT 10/11: no decreased BP with exercise   . Diastolic congestive heart failure   . Anemia   . GERD (gastroesophageal reflux disease)    Past Surgical History  Procedure Laterality Date  . Tubal ligation    . Cardiac  catheterization     Family History  Problem Relation Age of Onset  . Alcohol abuse Father   . Cirrhosis Father   . Asthma Brother   . Cervical cancer Maternal Grandmother    History  Substance Use Topics  . Smoking status: Never Smoker   . Smokeless tobacco: Never Used  . Alcohol Use: No   OB History    No data available     Review of Systems  Ten systems reviewed and are negative for acute change, except as noted in the HPI.    Allergies  Codeine phosphate  Home Medications   Prior to Admission medications   Medication Sig Start Date End Date Taking? Authorizing Provider  aspirin 81 MG EC tablet Take 81 mg by mouth daily.     Yes Historical Provider, MD  furosemide (LASIX) 20 MG tablet Take 40 mg by mouth daily. 03/12/14  Yes Historical Provider, MD  lisinopril-hydrochlorothiazide (PRINZIDE,ZESTORETIC) 20-25 MG per tablet TAKE 1 TABLET BY MOUTH DAILY.   Yes Andrena Mews, MD  metoprolol succinate (TOPROL-XL) 25 MG 24 hr tablet TAKE 1 TABLET (25 MG TOTAL) BY MOUTH DAILY. 12/01/13  Yes Lelon Perla, MD  Multiple Vitamin (MULTIVITAMIN) tablet Take 1 tablet by mouth daily.     Yes Historical Provider, MD  norethindrone (AYGESTIN) 5 MG tablet Take 5 mg by mouth daily.   Yes Historical Provider, MD  diclofenac (VOLTAREN) 75 MG EC tablet Take 1 tablet (75 mg total) by mouth  2 (two) times daily. Patient not taking: Reported on 04/18/2014 12/26/13   Waldemar Dickens, MD  KLOR-CON M20 20 MEQ tablet TAKE 1 TABLET (20 MEQ TOTAL) BY MOUTH DAILY. AS NEEDED WITH PRN LASIX. PER CARDIOLOGY Patient not taking: Reported on 04/18/2014 02/19/14   Lelon Perla, MD  traMADol (ULTRAM) 50 MG tablet Take 1 tablet (50 mg total) by mouth every 6 (six) hours as needed. Patient not taking: Reported on 04/18/2014 12/26/13   Waldemar Dickens, MD   BP 120/49 mmHg  Pulse 76  Temp(Src) 97.9 F (36.6 C) (Oral)  Resp 23  Ht 5\' 5"  (1.651 m)  Wt 197 lb (89.359 kg)  BMI 32.78 kg/m2  SpO2 99% Physical  Exam  Constitutional: She is oriented to person, place, and time. She appears well-developed and well-nourished. No distress.  Appears uncomfortable  HENT:  Head: Normocephalic and atraumatic.  Eyes: Conjunctivae and EOM are normal. Pupils are equal, round, and reactive to light. No scleral icterus.  Neck: Normal range of motion.  Cardiovascular: Normal rate, regular rhythm and normal heart sounds.  Exam reveals no gallop and no friction rub.   No murmur heard. Pulmonary/Chest: Effort normal and breath sounds normal. No respiratory distress.  Abdominal: Soft. Bowel sounds are normal. She exhibits no distension and no mass. There is no tenderness. There is no guarding.  Neurological: She is alert and oriented to person, place, and time.  Skin: Skin is warm and dry. She is not diaphoretic.  Nursing note and vitals reviewed.   ED Course  Procedures (including critical care time) Labs Review Labs Reviewed  CBC - Abnormal; Notable for the following:    WBC 11.8 (*)    Hemoglobin 10.4 (*)    HCT 31.8 (*)    MCV 77.8 (*)    MCH 25.4 (*)    RDW 16.0 (*)    All other components within normal limits  BASIC METABOLIC PANEL - Abnormal; Notable for the following:    Potassium 3.4 (*)    Glucose, Bld 111 (*)    GFR calc non Af Amer 65 (*)    GFR calc Af Amer 75 (*)    All other components within normal limits  BRAIN NATRIURETIC PEPTIDE - Abnormal; Notable for the following:    B Natriuretic Peptide 370.4 (*)    All other components within normal limits  D-DIMER, QUANTITATIVE  I-STAT TROPOININ, ED    Imaging Review Dg Chest 2 View  04/18/2014   CLINICAL DATA:  Shortness of breath, chest pain with exertion for 1 week. History of CHF, cardiomyopathy, hypertension,  EXAM: CHEST  2 VIEW  COMPARISON:  12/21/2011  FINDINGS: Heart is borderline in size. Mild peribronchial thickening. No confluent opacities or effusions. No acute bony abnormality.  IMPRESSION: Borderline heart size.  Mild  bronchitic changes.   Electronically Signed   By: Rolm Baptise M.D.   On: 04/18/2014 20:01     EKG Interpretation   Date/Time:  Saturday April 18 2014 17:06:26 EST Ventricular Rate:  97 PR Interval:  132 QRS Duration: 90 QT Interval:  354 QTC Calculation: 449 R Axis:   66 Text Interpretation:  Normal sinus rhythm Biatrial enlargement Left  ventricular hypertrophy with repolarization abnormality Abnormal ECG No  significant change since last tracing Confirmed by BEATON  MD, ROBERT  ([id number]) on 04/18/2014 5:11:23 PM       CRITICAL CARE Performed by: Margarita Mail   Total critical care time: 30  Critical care time was exclusive of  separately billable procedures and treating other patients.  Critical care was necessary to treat or prevent imminent or life-threatening deterioration.  Critical care was time spent personally by me on the following activities: development of treatment plan with patient and/or surrogate as well as nursing, discussions with consultants, evaluation of patient's response to treatment, examination of patient, obtaining history from patient or surrogate, ordering and performing treatments and interventions, ordering and review of laboratory studies, ordering and review of radiographic studies, pulse oximetry and re-evaluation of patient's condition.   MDM   Final diagnoses:  NSTEMI SOB (shortness of breath)  Exertional chest pain  Chest pressure   Patient with exertional cp, hx of CHF with cardiomyopathy. Last Echo shows EF 60-70% with grade 3 diastolic dysfunction. Her CXr shows no acute abnormalities first troponin negative.  Patient D-dimer is negative  Patient admitted for CP r/o by family medicine.    11:56 PM Patient awaiting bed placement and second troponin has come back elevated Ptietn appears to have an NSTEMI. I have ordered Heparin. Informed the family medicine resident and I have placed a call to cardiology.  I spoke with the  fellow on call for cardiology who is aware of the patient but asks that the admitting physician consult him. I spoke with the admitting resident to let her know. Patient sent to step down.    Margarita Mail, PA-C 04/19/14 [place], PA-C 04/19/14 [place], MD 04/19/14 [phone]

## 2014-04-18 NOTE — ED Notes (Signed)
Presents with SOB and left sided sharp chest pain began this AM-pt has HX of cardiac myopathy and CHF, SOB and paiin became worse after walking up steps associated with lightheadedness. Unable to lay down. Recently placed on new medication for haeavy vaginal bleeding-she is unsure what medication it is. Breath sounds are clear/

## 2014-04-18 NOTE — Progress Notes (Signed)
Contacted by ED concerning trend of troponin from 0.04 to 0.08. Plan to initiated Heparin drip and contact cardiology. Agree with plan due to active chest pain and doubling of troponin. Appreciate cardiology recommendations. Will continue to cycle troponins and monitor closely.   Dr. Gerlean Ren PGY-1

## 2014-04-19 ENCOUNTER — Encounter (HOSPITAL_COMMUNITY): Payer: Self-pay

## 2014-04-19 DIAGNOSIS — R0602 Shortness of breath: Secondary | ICD-10-CM | POA: Diagnosis present

## 2014-04-19 DIAGNOSIS — I422 Other hypertrophic cardiomyopathy: Secondary | ICD-10-CM | POA: Diagnosis not present

## 2014-04-19 DIAGNOSIS — I10 Essential (primary) hypertension: Secondary | ICD-10-CM

## 2014-04-19 DIAGNOSIS — E785 Hyperlipidemia, unspecified: Secondary | ICD-10-CM | POA: Diagnosis present

## 2014-04-19 DIAGNOSIS — F419 Anxiety disorder, unspecified: Secondary | ICD-10-CM | POA: Diagnosis present

## 2014-04-19 DIAGNOSIS — I209 Angina pectoris, unspecified: Secondary | ICD-10-CM

## 2014-04-19 DIAGNOSIS — N920 Excessive and frequent menstruation with regular cycle: Secondary | ICD-10-CM | POA: Diagnosis not present

## 2014-04-19 DIAGNOSIS — E876 Hypokalemia: Secondary | ICD-10-CM | POA: Diagnosis present

## 2014-04-19 DIAGNOSIS — Z7982 Long term (current) use of aspirin: Secondary | ICD-10-CM | POA: Diagnosis not present

## 2014-04-19 DIAGNOSIS — K219 Gastro-esophageal reflux disease without esophagitis: Secondary | ICD-10-CM | POA: Diagnosis present

## 2014-04-19 DIAGNOSIS — I5032 Chronic diastolic (congestive) heart failure: Secondary | ICD-10-CM | POA: Diagnosis not present

## 2014-04-19 DIAGNOSIS — D649 Anemia, unspecified: Secondary | ICD-10-CM | POA: Diagnosis present

## 2014-04-19 DIAGNOSIS — I421 Obstructive hypertrophic cardiomyopathy: Secondary | ICD-10-CM

## 2014-04-19 DIAGNOSIS — R072 Precordial pain: Secondary | ICD-10-CM

## 2014-04-19 LAB — HEPARIN LEVEL (UNFRACTIONATED)
HEPARIN UNFRACTIONATED: 0.26 [IU]/mL — AB (ref 0.30–0.70)
Heparin Unfractionated: 0.19 IU/mL — ABNORMAL LOW (ref 0.30–0.70)

## 2014-04-19 LAB — MRSA PCR SCREENING: MRSA BY PCR: NEGATIVE

## 2014-04-19 LAB — BASIC METABOLIC PANEL
ANION GAP: 5 (ref 5–15)
BUN: 12 mg/dL (ref 6–23)
CALCIUM: 8.5 mg/dL (ref 8.4–10.5)
CO2: 22 mmol/L (ref 19–32)
CREATININE: 0.79 mg/dL (ref 0.50–1.10)
Chloride: 109 mEq/L (ref 96–112)
GFR calc Af Amer: >90 mL/min (ref >90)
Glucose, Bld: 88 mg/dL (ref 70–99)
Potassium: 4.2 mmol/L (ref 3.5–5.1)
Sodium: 136 mmol/L (ref 135–145)

## 2014-04-19 LAB — HEMOGLOBIN A1C
Hgb A1c MFr Bld: 5.9 % — ABNORMAL HIGH (ref <5.7)
MEAN PLASMA GLUCOSE: 123 mg/dL — AB (ref <117)

## 2014-04-19 LAB — CBC
HEMATOCRIT: 30.9 % — AB (ref 36.0–46.0)
Hemoglobin: 9.6 g/dL — ABNORMAL LOW (ref 12.0–15.0)
MCH: 24.3 pg — ABNORMAL LOW (ref 26.0–34.0)
MCHC: 31.1 g/dL (ref 30.0–36.0)
MCV: 78.2 fL (ref 78.0–100.0)
Platelets: 207 10*3/uL (ref 150–400)
RBC: 3.95 MIL/uL (ref 3.87–5.11)
RDW: 16 % — AB (ref 11.5–15.5)
WBC: 8.8 10*3/uL (ref 4.0–10.5)

## 2014-04-19 LAB — TROPONIN I
TROPONIN I: 0.08 ng/mL — AB (ref <0.031)
Troponin I: 0.09 ng/mL — ABNORMAL HIGH (ref <0.031)
Troponin I: 0.13 ng/mL — ABNORMAL HIGH (ref <0.031)

## 2014-04-19 LAB — LIPID PANEL
CHOL/HDL RATIO: 4.8 ratio
Cholesterol: 138 mg/dL (ref 0–200)
HDL: 29 mg/dL — ABNORMAL LOW (ref >39)
LDL CALC: 90 mg/dL (ref 0–99)
Triglycerides: 96 mg/dL (ref <150)
VLDL: 19 mg/dL (ref 0–40)

## 2014-04-19 LAB — PROTIME-INR
INR: 1.13 (ref 0.00–1.49)
PROTHROMBIN TIME: 14.6 s (ref 11.6–15.2)

## 2014-04-19 LAB — TSH: TSH: 1.171 u[IU]/mL (ref 0.350–4.500)

## 2014-04-19 MED ORDER — HEPARIN (PORCINE) IN NACL 100-0.45 UNIT/ML-% IJ SOLN
1500.0000 [IU]/h | INTRAMUSCULAR | Status: DC
  Administered 2014-04-21: 1500 [IU]/h via INTRAVENOUS
  Filled 2014-04-19 (×7): qty 250

## 2014-04-19 MED ORDER — METOPROLOL TARTRATE 1 MG/ML IV SOLN
5.0000 mg | Freq: Four times a day (QID) | INTRAVENOUS | Status: DC
  Administered 2014-04-19 – 2014-04-21 (×8): 5 mg via INTRAVENOUS
  Filled 2014-04-19 (×19): qty 5

## 2014-04-19 MED ORDER — ACETAMINOPHEN 325 MG PO TABS
650.0000 mg | ORAL_TABLET | ORAL | Status: DC | PRN
  Administered 2014-04-19 – 2014-04-21 (×4): 650 mg via ORAL
  Filled 2014-04-19 (×3): qty 2

## 2014-04-19 MED ORDER — NORETHINDRONE ACETATE 5 MG PO TABS
5.0000 mg | ORAL_TABLET | Freq: Every day | ORAL | Status: DC
  Administered 2014-04-19 – 2014-04-21 (×3): 5 mg via ORAL
  Filled 2014-04-19 (×4): qty 1

## 2014-04-19 MED ORDER — ASPIRIN EC 325 MG PO TBEC
325.0000 mg | DELAYED_RELEASE_TABLET | Freq: Every day | ORAL | Status: DC
  Administered 2014-04-19 – 2014-04-21 (×3): 325 mg via ORAL
  Filled 2014-04-19 (×3): qty 1

## 2014-04-19 MED ORDER — PNEUMOCOCCAL VAC POLYVALENT 25 MCG/0.5ML IJ INJ
0.5000 mL | INJECTION | INTRAMUSCULAR | Status: DC
  Filled 2014-04-19: qty 0.5

## 2014-04-19 MED ORDER — NITROGLYCERIN IN D5W 200-5 MCG/ML-% IV SOLN: 0.0000 ug/min | INTRAVENOUS | Status: DC

## 2014-04-19 MED ORDER — MORPHINE SULFATE 2 MG/ML IJ SOLN
2.0000 mg | INTRAMUSCULAR | Status: DC | PRN
  Administered 2014-04-19 (×3): 2 mg via INTRAVENOUS
  Filled 2014-04-19 (×3): qty 1

## 2014-04-19 MED ORDER — LISINOPRIL-HYDROCHLOROTHIAZIDE 20-25 MG PO TABS: 1.0000 | ORAL_TABLET | Freq: Every day | ORAL | Status: DC

## 2014-04-19 MED ORDER — GI COCKTAIL ~~LOC~~
30.0000 mL | Freq: Four times a day (QID) | ORAL | Status: DC | PRN
  Administered 2014-04-19: 30 mL via ORAL
  Filled 2014-04-19 (×2): qty 30

## 2014-04-19 MED ORDER — LISINOPRIL 20 MG PO TABS
20.0000 mg | ORAL_TABLET | Freq: Every day | ORAL | Status: DC
  Administered 2014-04-19 – 2014-04-22 (×4): 20 mg via ORAL
  Filled 2014-04-19 (×4): qty 1

## 2014-04-19 MED ORDER — HEPARIN SODIUM (PORCINE) 5000 UNIT/ML IJ SOLN: 5000.0000 [IU] | Freq: Three times a day (TID) | INTRAMUSCULAR | Status: DC

## 2014-04-19 MED ORDER — HEPARIN (PORCINE) IN NACL 100-0.45 UNIT/ML-% IJ SOLN
950.0000 [IU]/h | INTRAMUSCULAR | Status: DC
  Administered 2014-04-19: 950 [IU]/h via INTRAVENOUS
  Filled 2014-04-19: qty 250

## 2014-04-19 MED ORDER — ONDANSETRON HCL 4 MG/2ML IJ SOLN: 4.0000 mg | Freq: Four times a day (QID) | INTRAMUSCULAR | Status: DC | PRN

## 2014-04-19 MED ORDER — NITROGLYCERIN IN D5W 200-5 MCG/ML-% IV SOLN
0.0000 ug/min | INTRAVENOUS | Status: DC
  Administered 2014-04-19: 5 ug/min via INTRAVENOUS
  Filled 2014-04-19: qty 250

## 2014-04-19 MED ORDER — HEPARIN BOLUS VIA INFUSION
4000.0000 [IU] | Freq: Once | INTRAVENOUS | Status: AC
  Administered 2014-04-19: 4000 [IU] via INTRAVENOUS
  Filled 2014-04-19: qty 4000

## 2014-04-19 MED ORDER — HYDROCHLOROTHIAZIDE 25 MG PO TABS
25.0000 mg | ORAL_TABLET | Freq: Every day | ORAL | Status: DC
  Administered 2014-04-19 – 2014-04-22 (×4): 25 mg via ORAL
  Filled 2014-04-19 (×4): qty 1

## 2014-04-19 NOTE — Progress Notes (Signed)
Titrated NTG gtt up earlier with little pain relief.  Placed on 4l [place] o2.  Morphine given for chest pain and tylenol for headache.  Vs stable will  Continue to monitor. Saunders Revel T

## 2014-04-19 NOTE — Progress Notes (Signed)
ANTICOAGULATION CONSULT NOTE - Initial Consult  Pharmacy Consult for Heparin  Indication: chest pain/ACS  Allergies  Allergen Reactions  . Codeine Phosphate Rash    itching    Patient Measurements: Height: 5\' 5"  (165.1 cm) Weight: 197 lb (89.359 kg) IBW/kg (Calculated) : 57 Heparin Dosing Weight: ~76 kg  Vital Signs: Temp: 97.9 F (36.6 C) (12/26 1711) Temp Source: Oral (12/26 1711) BP: 114/53 mmHg (12/26 2330) Pulse Rate: 67 (12/26 2330)  Labs:  Recent Labs  04/18/14 1721 04/18/14 2218  HGB 10.4*  --   HCT 31.8*  --   PLT 228  --   CREATININE 0.99  --   TROPONINI  --  0.08*    Estimated Creatinine Clearance: 74.3 mL/min (by C-G formula based on Cr of 0.99).   Medical History: Past Medical History  Diagnosis Date  . Chest pain     cath 7/09: normal cors; EF 65%  . HTN (hypertension)   . Hyperlipidemia   . HOCM (hypertrophic obstructive cardiomyopathy)     echo 10/11: normal LVF; apical hypertropy; mod LAE; mild MR;      Holter 10/11: no NSVT;     ETT 10/11: no decreased BP with exercise   . Diastolic congestive heart failure   . Anemia   . GERD (gastroesophageal reflux disease)     Assessment: Starting heparin for mildly elevated troponin. Hgb 10.4, renal function good, other labs as above.   Goal of Therapy:  Heparin level 0.3-0.7 units/ml Monitor platelets by anticoagulation protocol: Yes   Plan:  -Heparin 4000 units BOLUS -Start heparin drip at 950 units/hr -0800 HL -Daily CBC/HL -Monitor for bleeding -F/U cardiology plans  Narda Bonds 04/19/2014,12:18 AM

## 2014-04-19 NOTE — Progress Notes (Signed)
Family Medicine Teaching Service Daily Progress Note Intern Pager: [phone]  Patient name: [name] Medical record number: [id number] Date of birth: April 11, 1961 Age: 53 y.o. Gender: female  Primary Care Provider: Andrena Mews, MD Consultants: Cardiology Code Status: Full  Assessment and Plan: [name] is a 53 y.o. female presenting with chest pain and shortness of breath on exertion. PMH is significant for dCHF grade II, HOCM, anxiety, HTN, anemia, and GERD.  # Chest Pain:  - EKG- unchanged with biatrial enlargement and left ventricular hypertrophy with repolarization abnormality  - Troponin 0.04>0.08>0.09>0.13 - BNP 370.4 - D-Dimer <0.27 - Lipid Panel- HDL 29 - TSH 1.171 - A1C pending - CXR- mild bronchitic changes  - Last echo 10/2013 showed vigrous LV function with severe apical hypertrophy, grade 2 diastolic dysfunction, mild left atrial enlargement, mild mitral regurgitation, and findings consistent with apical hypertrophic cardiomyopathy. - Aspirin 325mg  - Morphine q2hr PRN - Nitro Drip - Heparin Drip per pharmacy - GI Cocktail (Maalox, Lidocaine, Donnatal) q4 PRN - Cardiology Consulted. Appreciate recommendations.  # HTN- Home Medications: Lisinopril-HCTZ 20-25mg  - Continue home medications - Well controlled today. BP 125/65 - Will continue to monitor.  # H/O Hypokalemia- Home Medication Klor-Con 56mEq daily with PRN lasix - Potassium 3.4 yesterday, today's value in process - KDur 42mEq x1 - Will monitor with BMP   # Vaginal Bleeding- Recently started on Norethindrone 5mg  on 04/15/14 - Will continue home medications - Monitor closely for worsening on heparin drip - Follow with CBCs. Hemoglobin 9.6  FEN/GI: NPO, Saline Lock, Zofran PRN Prophylaxis: Subcutaneous Heparin  Disposition: Admitted to [facility] Medicine Teaching Service.   Subjective:  No acute complaints overnight.States she has had intermittent chest pain overnight, improved on  nitroglycerin drip. Is not currently experiencing chest pain. Has headache as a result of Nitroglycerin drip. States cardiology gave her a medication for her headache which has helped. No further complaints today.  Objective: Temp:  [97.7 F (36.5 C)-98.2 F (36.8 C)] 97.7 F (36.5 C) (12/27 0400) Pulse Rate:  [60-98] 66 (12/27 0600) Resp:  [12-27] 22 (12/27 0600) BP: (102-147)/(43-106) 129/68 mmHg (12/27 0600) SpO2:  [96 %-100 %] 100 % (12/27 0600) Weight:  [197 lb (89.359 kg)-199 lb 4.8 oz (90.402 kg)] 199 lb 4.8 oz (90.402 kg) (12/27 0218) Physical Exam: General: 53yo female resting comfortably in no apparent distress Cardiovascular: S1 and S2 noted. Systolic murmur noted. Regular rate and rhythm. Tenderness with palpation of left chest. Respiratory: Clear to auscultation bilaterally. No wheezes, rales, or rhonchi. No increased work of breathing. Abdomen: Bowel sounds noted. Soft and nondistended. No tenderness or masses to palpation. Extremities: 1+ pitting edema. Improved since exam yesterday.  Laboratory:  Recent Labs Lab 04/18/14 1721  WBC 11.8*  HGB 10.4*  HCT 31.8*  PLT 228    Recent Labs Lab 04/18/14 1721  NA 139  K 3.4*  CL 108  CO2 25  BUN 13  CREATININE 0.99  CALCIUM 8.9  GLUCOSE 111*   Lipid Panel     Component Value Date/Time   CHOL 138 04/19/2014 0725   TRIG 96 04/19/2014 0725   HDL 29* 04/19/2014 0725   CHOLHDL 4.8 04/19/2014 0725   VLDL 19 04/19/2014 0725   LDLCALC 90 04/19/2014 0725  - Troponin 0.04>0.08>0.09 - D Dimer <0.27 - BNP 370.4  Imaging/Diagnostic Tests: Dg Chest 2 View  04/18/2014   CLINICAL DATA:  Shortness of breath, chest pain with exertion for 1 week. History of CHF, cardiomyopathy, hypertension,  EXAM: CHEST  2 VIEW  COMPARISON:  12/21/2011  FINDINGS: Heart is borderline in size. Mild peribronchial thickening. No confluent opacities or effusions. No acute bony abnormality.  IMPRESSION: Borderline heart size.  Mild bronchitic  changes.   Electronically Signed   By: Rolm Baptise M.D.   On: 04/18/2014 20:01   Lorna Few, DO 04/19/2014, 7:27 AM PGY-1, [place] Intern pager: [phone], text pages welcome

## 2014-04-19 NOTE — Progress Notes (Signed)
UR Completed.  [phone]  

## 2014-04-19 NOTE — Progress Notes (Signed)
ANTICOAGULATION CONSULT NOTE - Initial Consult  Pharmacy Consult for Heparin  Indication: chest pain/ACS  Allergies  Allergen Reactions  . Codeine Phosphate Rash    itching    Patient Measurements: Height: 5\' 5"  (165.1 cm) Weight: 199 lb 4.8 oz (90.402 kg) IBW/kg (Calculated) : 57 Heparin Dosing Weight: ~76 kg  Vital Signs: Temp: 98.1 F (36.7 C) (12/27 1553) Temp Source: Oral (12/27 1553) BP: 110/67 mmHg (12/27 1553) Pulse Rate: 60 (12/27 1553)  Labs:  Recent Labs  04/18/14 1721 04/18/14 2110  04/19/14 0317 04/19/14 0725 04/19/14 0810 04/19/14 1512 04/19/14 1705  HGB 10.4*  --   --   --  9.6*  --   --   --   HCT 31.8*  --   --   --  30.9*  --   --   --   PLT 228  --   --   --  207  --   --   --   LABPROT  --  14.6  --   --   --   --   --   --   INR  --  1.13  --   --   --   --   --   --   HEPARINUNFRC  --   --   --   --   --  0.26*  --  0.19*  CREATININE 0.99  --   --   --   --  0.79  --   --   TROPONINI  --   --   < > 0.09*  --  0.13* 0.08*  --   < > = values in this interval not displayed.  Estimated Creatinine Clearance: 92.5 mL/min (by C-G formula based on Cr of 0.79).   Medical History: Past Medical History  Diagnosis Date  . Chest pain     cath 7/09: normal cors; EF 65%  . HTN (hypertension)   . Hyperlipidemia   . HOCM (hypertrophic obstructive cardiomyopathy)     echo 10/11: normal LVF; apical hypertropy; mod LAE; mild MR;      Holter 10/11: no NSVT;     ETT 10/11: no decreased BP with exercise   . Diastolic congestive heart failure   . Anemia   . GERD (gastroesophageal reflux disease)     Assessment: 53 yo F admitted on 04/18/2014 with SOB and left sided sharp CP to start heparin for ACS. Initial HL is below goal at 0.26 with no interruptions in gtt per nurse. H/H has trended down, plt remain wnl and stable with improvement in vaginal bleeding per nurse.   HL remains subtherapeutic at 0.19 on heparin 1100 units/hr. Nurse reports no further  bleeding.  Goal of Therapy:  Heparin level 0.3-0.7 units/ml Monitor platelets by anticoagulation protocol: Yes   Plan:  - Increase heparin gtt to 1350 u/hr (~3.5 u/kg/hr increase) - 6 hour HL - Daily CBC/HL - Monitor for bleeding - F/U cardiology plans   Andrey Cota. Diona Foley, PharmD Clinical Pharmacist Pager [phone]  04/19/2014 6:03 PM

## 2014-04-19 NOTE — Consult Note (Signed)
Reason for Consult: chest pain and shortness of breath    Referring Physician: Dr. [facility]  PCP:  Andrena Mews, MD  Primary Cardiologist:Dr. Rodena Medin [name] is an 53 y.o. female.    Chief Complaint: chest pain and DOE   HPI: 53 year old female with hx. of apical hypertrophic cardiomyopathy. A myoview in 2009 showed EF 44%, mild mid and apical anterior wall ischemia. She had a normal cardiac catheterization in 2009 other than apical hypertrophy. A Holter monitor showed no nonsustained ventricular tachycardia. An exercise treadmill revealed no decrease in systolic blood pressure with exercise. Echo repeated in July 2015. Ejection fraction 24-23%, grade 2 diastolic dysfunction, mild left atrial enlargement, mild mitral regurgitation, findings consistent with apical hypertrophic cardiomyopathy. Also with history of diastolic congestive heart failure.   Pt presented to ER yesterday with chest pain, described as crushing on Lt with radiation to lt neck associated with SOB.  She had taken Lasix prior to admit of 160 mg po for DOE and feeling swollen.  Her chronic DOE and pain were increased she could not walk any distance without chest pain and SOB, with rest it would resolve.  No nausea or diaphoresis.  Her heart would race with this as well.  If she climbed steps the pain and SOB would intensify and she would become lightheaded.   Here in ER Troponin was elevated at 0.08 and 0.09.  + anemia with H/H 9.6/30.9 and recet hx of vaginal bleeding- which she thought was making symptoms more intense.    EKG with LVH though slightly deeper T wave inversions in lat leads. DDimer negative and BNP 370.  She had another episode of chest pain at rest this AM at 0500.  She is on IV Heparin and IV NTG.  Another troponin just drawn.  Past Medical History  Diagnosis Date  . Chest pain     cath 7/09: normal cors; EF 65%  . HTN (hypertension)   . Hyperlipidemia   . HOCM  (hypertrophic obstructive cardiomyopathy)     echo 10/11: normal LVF; apical hypertropy; mod LAE; mild MR;      Holter 10/11: no NSVT;     ETT 10/11: no decreased BP with exercise   . Diastolic congestive heart failure   . Anemia   . GERD (gastroesophageal reflux disease)     Past Surgical History  Procedure Laterality Date  . Tubal ligation    . Cardiac catheterization      Family History  Problem Relation Age of Onset  . Alcohol abuse Father   . Cirrhosis Father   . Asthma Brother   . Cervical cancer Maternal Grandmother    Social History:  reports that she has never smoked. She has never used smokeless tobacco. She reports that she does not drink alcohol or use illicit drugs.  Allergies:  Allergies  Allergen Reactions  . Codeine Phosphate Rash    itching    Medications Prior to Admission  Medication Sig Dispense Refill  . aspirin 81 MG EC tablet Take 81 mg by mouth daily.      . furosemide (LASIX) 20 MG tablet Take 40 mg by mouth daily.  12  . lisinopril-hydrochlorothiazide (PRINZIDE,ZESTORETIC) 20-25 MG per tablet TAKE 1 TABLET BY MOUTH DAILY. 90 tablet 3  . metoprolol succinate (TOPROL-XL) 25 MG 24 hr tablet TAKE 1 TABLET (25 MG TOTAL) BY MOUTH DAILY. 30 tablet 4  . Multiple Vitamin (MULTIVITAMIN) tablet Take  1 tablet by mouth daily.      . norethindrone (AYGESTIN) 5 MG tablet Take 5 mg by mouth daily.    . diclofenac (VOLTAREN) 75 MG EC tablet Take 1 tablet (75 mg total) by mouth 2 (two) times daily. (Patient not taking: Reported on 04/18/2014) 60 tablet 0  . KLOR-CON M20 20 MEQ tablet TAKE 1 TABLET (20 MEQ TOTAL) BY MOUTH DAILY. AS NEEDED WITH PRN LASIX. PER CARDIOLOGY (Patient not taking: Reported on 04/18/2014) 30 tablet 3  . traMADol (ULTRAM) 50 MG tablet Take 1 tablet (50 mg total) by mouth every 6 (six) hours as needed. (Patient not taking: Reported on 04/18/2014) 15 tablet 0    Results for orders placed or performed during the hospital encounter of 04/18/14  (from the past 48 hour(s))  CBC     Status: Abnormal   Collection Time: 04/18/14  5:21 PM  Result Value Ref Range   WBC 11.8 (H) 4.0 - 10.5 K/uL   RBC 4.09 3.87 - 5.11 MIL/uL   Hemoglobin 10.4 (L) 12.0 - 15.0 g/dL   HCT 31.8 (L) 36.0 - 46.0 %   MCV 77.8 (L) 78.0 - 100.0 fL   MCH 25.4 (L) 26.0 - 34.0 pg   MCHC 32.7 30.0 - 36.0 g/dL   RDW 16.0 (H) 11.5 - 15.5 %   Platelets 228 150 - 400 K/uL  Basic metabolic panel     Status: Abnormal   Collection Time: 04/18/14  5:21 PM  Result Value Ref Range   Sodium 139 135 - 145 mmol/L    Comment: Please note change in reference range.   Potassium 3.4 (L) 3.5 - 5.1 mmol/L    Comment: Please note change in reference range.   Chloride 108 96 - 112 mEq/L   CO2 25 19 - 32 mmol/L   Glucose, Bld 111 (H) 70 - 99 mg/dL   BUN 13 6 - 23 mg/dL   Creatinine, Ser 0.99 0.50 - 1.10 mg/dL   Calcium 8.9 8.4 - 10.5 mg/dL   GFR calc non Af Amer 65 (L) >90 mL/min   GFR calc Af Amer 75 (L) >90 mL/min    Comment: (NOTE) The eGFR has been calculated using the CKD EPI equation. This calculation has not been validated in all clinical situations. eGFR's persistently <90 mL/min signify possible Chronic Kidney Disease.    Anion gap 6 5 - 15  BNP (order ONLY if patient complains of dyspnea/SOB AND you have documented it for THIS visit)     Status: Abnormal   Collection Time: 04/18/14  5:21 PM  Result Value Ref Range   B Natriuretic Peptide 370.4 (H) 0.0 - 100.0 pg/mL    Comment: Please note change in reference range.  I-stat troponin, ED (not at [facility])     Status: None   Collection Time: 04/18/14  5:29 PM  Result Value Ref Range   Troponin i, poc 0.04 0.00 - 0.08 ng/mL   Comment 3            Comment: Due to the release kinetics of cTnI, a negative result within the first hours of the onset of symptoms does not rule out myocardial infarction with certainty. If myocardial infarction is still suspected, repeat the test at appropriate intervals.   D-dimer,  quantitative     Status: None   Collection Time: 04/18/14  7:50 PM  Result Value Ref Range   D-Dimer, Quant <0.27 0.00 - 0.48 ug/mL-FEU    Comment:  AT THE INHOUSE ESTABLISHED CUTOFF VALUE OF 0.48 ug/mL FEU, THIS ASSAY HAS BEEN DOCUMENTED IN THE LITERATURE TO HAVE A SENSITIVITY AND NEGATIVE PREDICTIVE VALUE OF AT LEAST 98 TO 99%.  THE TEST RESULT SHOULD BE CORRELATED WITH AN ASSESSMENT OF THE CLINICAL PROBABILITY OF DVT / VTE.   Protime-INR     Status: None   Collection Time: 04/18/14  9:10 PM  Result Value Ref Range   Prothrombin Time 14.6 11.6 - 15.2 seconds   INR 1.13 0.00 - 1.49  Troponin I     Status: Abnormal   Collection Time: 04/18/14 10:18 PM  Result Value Ref Range   Troponin I 0.08 (H) <0.031 ng/mL    Comment:        PERSISTENTLY INCREASED TROPONIN VALUES IN THE RANGE OF 0.04-0.49 ng/mL CAN BE SEEN IN:       -UNSTABLE ANGINA       -CONGESTIVE HEART FAILURE       -MYOCARDITIS       -CHEST TRAUMA       -ARRYHTHMIAS       -LATE PRESENTING MYOCARDIAL INFARCTION       -COPD   CLINICAL FOLLOW-UP RECOMMENDED. Please note change in reference range.   MRSA PCR Screening     Status: None   Collection Time: 04/19/14  2:15 AM  Result Value Ref Range   MRSA by PCR NEGATIVE NEGATIVE    Comment:        The GeneXpert MRSA Assay (FDA approved for NASAL specimens only), is one component of a comprehensive MRSA colonization surveillance program. It is not intended to diagnose MRSA infection nor to guide or monitor treatment for MRSA infections.   Troponin I (q 6hr x 3)     Status: Abnormal   Collection Time: 04/19/14  3:17 AM  Result Value Ref Range   Troponin I 0.09 (H) <0.031 ng/mL    Comment:        PERSISTENTLY INCREASED TROPONIN VALUES IN THE RANGE OF 0.04-0.49 ng/mL CAN BE SEEN IN:       -UNSTABLE ANGINA       -CONGESTIVE HEART FAILURE       -MYOCARDITIS       -CHEST TRAUMA       -ARRYHTHMIAS       -LATE PRESENTING MYOCARDIAL INFARCTION        -COPD   CLINICAL FOLLOW-UP RECOMMENDED. Please note change in reference range.   CBC     Status: Abnormal   Collection Time: 04/19/14  7:25 AM  Result Value Ref Range   WBC 8.8 4.0 - 10.5 K/uL   RBC 3.95 3.87 - 5.11 MIL/uL   Hemoglobin 9.6 (L) 12.0 - 15.0 g/dL   HCT 30.9 (L) 36.0 - 46.0 %   MCV 78.2 78.0 - 100.0 fL   MCH 24.3 (L) 26.0 - 34.0 pg   MCHC 31.1 30.0 - 36.0 g/dL   RDW 16.0 (H) 11.5 - 15.5 %   Platelets 207 150 - 400 K/uL  Lipid panel     Status: Abnormal   Collection Time: 04/19/14  7:25 AM  Result Value Ref Range   Cholesterol 138 0 - 200 mg/dL   Triglycerides 96 <150 mg/dL   HDL 29 (L) >39 mg/dL   Total CHOL/HDL Ratio 4.8 RATIO   VLDL 19 0 - 40 mg/dL   LDL Cholesterol 90 0 - 99 mg/dL    Comment:        Total Cholesterol/HDL:CHD Risk Coronary Heart Disease Risk Table  Men   Women  1/2 Average Risk   3.4   3.3  Average Risk       5.0   4.4  2 X Average Risk   9.6   7.1  3 X Average Risk  23.4   11.0        Use the calculated Patient Ratio above and the CHD Risk Table to determine the patient's CHD Risk.        ATP III CLASSIFICATION (LDL):  <100     mg/dL   Optimal  100-129  mg/dL   Near or Above                    Optimal  130-159  mg/dL   Borderline  160-189  mg/dL   High  >190     mg/dL   Very High    Dg Chest 2 View  04/18/2014   CLINICAL DATA:  Shortness of breath, chest pain with exertion for 1 week. History of CHF, cardiomyopathy, hypertension,  EXAM: CHEST  2 VIEW  COMPARISON:  12/21/2011  FINDINGS: Heart is borderline in size. Mild peribronchial thickening. No confluent opacities or effusions. No acute bony abnormality.  IMPRESSION: Borderline heart size.  Mild bronchitic changes.   Electronically Signed   By: Rolm Baptise M.D.   On: 04/18/2014 20:01    ROS: General:no colds or fevers, some increase in weight  Skin:no rashes or ulcers HEENT:no blurred vision, no congestion CV:see HPI PUL:see HPI GI:no diarrhea  constipation or melena, no indigestion GU:no hematuria, no dysuria MS:no joint pain, no claudication Neuro:no syncope, + lightheadedness with recent chest pain and DOE  Endo:no diabetes, no thyroid disease GYN:  Increased vaginal bleeding has seen GYN   Blood pressure 121/66, pulse 65, temperature 98.1 F (36.7 C), temperature source Oral, resp. rate 14, height '5\' 5"'  (1.651 m), weight 199 lb 4.8 oz (90.402 kg), last menstrual period 03/18/2014, SpO2 99 %.  Wt Readings from Last 3 Encounters:  04/19/14 199 lb 4.8 oz (90.402 kg)  01/08/14 192 lb (87.091 kg)  11/06/13 197 lb 1.6 oz (89.404 kg)    PE: General:Pleasant affect, NAD, concerned about her pain. Skin:Warm and dry, brisk capillary refill HEENT:normocephalic, sclera clear, mucus membranes moist Neck:supple, no JVD, no bruits, no adenopathy  Heart:S1S2 RRR without murmur, gallup, rub or click, mildly tender to chest wall with palpation Lungs:diminished without rales, rhonchi, or wheezes PJA:SNKN, non tender, + BS, do not palpate liver spleen or masses Ext:no lower ext edema, 2+ pedal pulses, 2+ radial pulses Neuro:alert and oriented X 3, MAE, follows commands, + facial symmetry Tele: SR with deep T wave inversions.   Assessment/Plan Principal Problem:   Chest pain- with exertion with DOE and tachycardia, now episodes with rest.  MD to see- ? Cath to determine CAD -last cath 2009 Active Problems:   HYPERTENSION, BENIGN SYSTEMIC- BP controlled   Hypertrophic obstructive cardiomyopathy   Exertional chest pain    [facility]  Nurse Practitioner Certified  Cardiology Attending  Patient seen and examined. I have reviewed the finding in the history, physical exam, assessment and plan as above and concur with minor modifications. She has a h/o chest pain and a negative catheterization in 2009. She presented with SSCP and sob. Denies medication non-compliance or dietary non-compliance. Her initial cardiac enzymes essentially  negative. Her troponin is slightly above normal but her HCM would make this finding less reliable as a predictor of an epicardial coronary lesion. I will discuss with  her primary cardiologist and likely schedule a stress test in the morning.  Clinically she is not volume overloaded and her HR is well controlled.   Mikle Bosworth.D.

## 2014-04-19 NOTE — Progress Notes (Signed)
ANTICOAGULATION CONSULT NOTE - Initial Consult  Pharmacy Consult for Heparin  Indication: chest pain/ACS  Allergies  Allergen Reactions  . Codeine Phosphate Rash    itching    Patient Measurements: Height: 5\' 5"  (165.1 cm) Weight: 199 lb 4.8 oz (90.402 kg) IBW/kg (Calculated) : 57 Heparin Dosing Weight: ~76 kg  Vital Signs: Temp: 98.1 F (36.7 C) (12/27 0752) Temp Source: Oral (12/27 0752) BP: 125/65 mmHg (12/27 0800) Pulse Rate: 64 (12/27 0800)  Labs:  Recent Labs  04/18/14 1721 04/18/14 2110 04/18/14 2218 04/19/14 0317 04/19/14 0725 04/19/14 0810  HGB 10.4*  --   --   --  9.6*  --   HCT 31.8*  --   --   --  30.9*  --   PLT 228  --   --   --  207  --   LABPROT  --  14.6  --   --   --   --   INR  --  1.13  --   --   --   --   HEPARINUNFRC  --   --   --   --   --  0.26*  CREATININE 0.99  --   --   --   --   --   TROPONINI  --   --  0.08* 0.09*  --  0.13*    Estimated Creatinine Clearance: 74.7 mL/min (by C-G formula based on Cr of 0.99).   Medical History: Past Medical History  Diagnosis Date  . Chest pain     cath 7/09: normal cors; EF 65%  . HTN (hypertension)   . Hyperlipidemia   . HOCM (hypertrophic obstructive cardiomyopathy)     echo 10/11: normal LVF; apical hypertropy; mod LAE; mild MR;      Holter 10/11: no NSVT;     ETT 10/11: no decreased BP with exercise   . Diastolic congestive heart failure   . Anemia   . GERD (gastroesophageal reflux disease)     Assessment: 53 yo F admitted on 04/18/2014 with SOB and left sided sharp CP to start heparin for ACS. Initial HL is below goal at 0.26 with no interruptions in gtt per nurse. H/H has trended down, plt remain wnl and stable with improvement in vaginal bleeding per nurse.   Goal of Therapy:  Heparin level 0.3-0.7 units/ml Monitor platelets by anticoagulation protocol: Yes   Plan:  - Increase heparin gtt to 1100 u/hr (~2 u/kg/hr increase) - 6 hour HL - Daily CBC/HL - Monitor for bleeding -  F/U cardiology plans   [employer] K. Velva Harman, PharmD Clinical Pharmacist - Resident Pager: [phone] Pharmacy: [phone] 04/19/2014 9:25 AM

## 2014-04-20 ENCOUNTER — Inpatient Hospital Stay (HOSPITAL_COMMUNITY): Payer: 59

## 2014-04-20 DIAGNOSIS — R079 Chest pain, unspecified: Secondary | ICD-10-CM

## 2014-04-20 LAB — CBC
HCT: 30.8 % — ABNORMAL LOW (ref 36.0–46.0)
Hemoglobin: 9.8 g/dL — ABNORMAL LOW (ref 12.0–15.0)
MCH: 25.6 pg — AB (ref 26.0–34.0)
MCHC: 31.8 g/dL (ref 30.0–36.0)
MCV: 80.4 fL (ref 78.0–100.0)
PLATELETS: 207 10*3/uL (ref 150–400)
RBC: 3.83 MIL/uL — ABNORMAL LOW (ref 3.87–5.11)
RDW: 16.4 % — ABNORMAL HIGH (ref 11.5–15.5)
WBC: 9.6 10*3/uL (ref 4.0–10.5)

## 2014-04-20 LAB — HEPARIN LEVEL (UNFRACTIONATED): HEPARIN UNFRACTIONATED: 0.36 [IU]/mL (ref 0.30–0.70)

## 2014-04-20 MED ORDER — ASPIRIN 81 MG PO CHEW
81.0000 mg | CHEWABLE_TABLET | ORAL | Status: AC
  Administered 2014-04-21: 81 mg via ORAL
  Filled 2014-04-20: qty 1

## 2014-04-20 MED ORDER — SODIUM CHLORIDE 0.9 % IV SOLN: 250.0000 mL | INTRAVENOUS | Status: DC | PRN

## 2014-04-20 MED ORDER — SODIUM CHLORIDE 0.9 % IJ SOLN
3.0000 mL | Freq: Two times a day (BID) | INTRAMUSCULAR | Status: DC
  Administered 2014-04-20 – 2014-04-21 (×2): 3 mL via INTRAVENOUS

## 2014-04-20 MED ORDER — REGADENOSON 0.4 MG/5ML IV SOLN
INTRAVENOUS | Status: AC
  Administered 2014-04-20: 0.4 mg via INTRAVENOUS
  Filled 2014-04-20: qty 5

## 2014-04-20 MED ORDER — SODIUM CHLORIDE 0.9 % IJ SOLN
80.0000 mg | INTRAVENOUS | Status: AC
  Administered 2014-04-20: 80 mg via INTRAVENOUS

## 2014-04-20 MED ORDER — TECHNETIUM TC 99M SESTAMIBI - CARDIOLITE
30.0000 | Freq: Once | INTRAVENOUS | Status: AC | PRN
  Administered 2014-04-20: 30 via INTRAVENOUS

## 2014-04-20 MED ORDER — TECHNETIUM TC 99M SESTAMIBI GENERIC - CARDIOLITE
10.0000 | Freq: Once | INTRAVENOUS | Status: AC | PRN
  Administered 2014-04-20: 10 via INTRAVENOUS

## 2014-04-20 MED ORDER — REGADENOSON 0.4 MG/5ML IV SOLN
0.4000 mg | Freq: Once | INTRAVENOUS | Status: AC
  Administered 2014-04-20: 0.4 mg via INTRAVENOUS

## 2014-04-20 MED ORDER — SODIUM CHLORIDE 0.9 % IJ SOLN: 3.0000 mL | INTRAMUSCULAR | Status: DC | PRN

## 2014-04-20 MED ORDER — ACETAMINOPHEN 325 MG PO TABS
ORAL_TABLET | ORAL | Status: AC
  Filled 2014-04-20: qty 2

## 2014-04-20 MED ORDER — SODIUM CHLORIDE 0.9 % IV SOLN
Freq: Once | INTRAVENOUS | Status: AC
  Administered 2014-04-21: 05:00:00 via INTRAVENOUS

## 2014-04-20 NOTE — Progress Notes (Addendum)
ANTICOAGULATION CONSULT NOTE - Follow Up Consult  Pharmacy Consult for Heparin Indication: chest pain/ACS  Allergies  Allergen Reactions  . Codeine Phosphate Rash    itching    Patient Measurements: Height: 5\' 5"  (165.1 cm) Weight: 199 lb 4.8 oz (90.402 kg) IBW/kg (Calculated) : 57 Heparin Dosing Weight: 76kg  Vital Signs: Temp: 97.8 F (36.6 C) (12/28 1135) Temp Source: Oral (12/28 1135) BP: 138/46 mmHg (12/28 1135) Pulse Rate: 90 (12/28 1023)  Labs:  Recent Labs  04/18/14 1721 04/18/14 2110  04/19/14 0317 04/19/14 0725 04/19/14 0810 04/19/14 1512 04/19/14 1705 04/20/14 0024  HGB 10.4*  --   --   --  9.6*  --   --   --  9.8*  HCT 31.8*  --   --   --  30.9*  --   --   --  30.8*  PLT 228  --   --   --  207  --   --   --  207  LABPROT  --  14.6  --   --   --   --   --   --   --   INR  --  1.13  --   --   --   --   --   --   --   HEPARINUNFRC  --   --   --   --   --  0.26*  --  0.19* 0.36  CREATININE 0.99  --   --   --   --  0.79  --   --   --   TROPONINI  --   --   < > 0.09*  --  0.13* 0.08*  --   --   < > = values in this interval not displayed.  Estimated Creatinine Clearance: 92.5 mL/min (by C-G formula based on Cr of 0.79).   Medications:  Heparin @ 1350 units/hr  Assessment: 53yof continues on heparin for chest pain. Heparin level is therapeutic. She had a stress test today (results pending). If abnormal may need cath tomorrow. CBC is stable. No bleeding reported.  Goal of Therapy:  Heparin level 0.3-0.7 units/ml Monitor platelets by anticoagulation protocol: Yes   Plan:  1) Continue heparin at 1350 units/hr 2) Follow up results of stress test  Deboraha Sprang 04/20/2014,2:20 PM

## 2014-04-20 NOTE — Discharge Summary (Signed)
[facility] Discharge Summary  Patient name: NYGERIA LAGER Medical record number: [id number] Date of birth: 07/01/1960 Age: 53 y.o. Gender: female Date of Admission: 04/18/2014  Date of Discharge: 04/20/14 Admitting Physician: Lind Covert, MD  Primary Care Provider: Andrena Mews, MD Consultants: Cardiology  Indication for Hospitalization: Chest Pain  Discharge Diagnoses/Problem List:  Chest Pain HTN Hypokalemia dCHF HOCM Anxiety  Disposition: Discharge Home  Discharge Condition: Stable  Brief Hospital Course:  Mrs. Kincheloe presented to the ED on 04/18/14 with chest pain and shortness of breath. Heart Score of 5. EKG was unchanged. Initial troponin was 0.04, however cycled troponins trended up to 0.13. BNP was 370.4. D dimer <0.27. Risk stratification showed HDL 29, TSH 1.171, and A1C of 5.9. CXR showed mild bronchitic changes. Aspirin 325mg , Morphine, and nitroglycerin and heparin drips were initiated.  Cardiology was consulted on 12/26 and recommended stress test. Stress test showed mid/distal anterior/anterolateral walls concerning for ischemia, mod LV dysfunction, EF 37% -- findings were that of a high risk stress test.  Cardiac Catherization was performed on 12/29. Showed a apical variant hypertrophic cardiomyopathy with marked apical hypertrophy and hyperdynamic LV function. Cardiology recommended medical management. Toprol dose was changed to 25 mg daily. Patient's home lisinopril, Lasix, aspirin were encouraged to continue on home regimens. Patient was informed to record her daily weights after discharge. And to take double her usual amount of Lasix when her weight he comes greater than 192 lbs. she is also encouraged to bring this weight log to her appointment with cardiology on January 22.  Potassium noted to be 3.4 at admission. Potassium repleted and monitored throughout hospitalization. Improved to 4.2 prior to  discharge.  Patient was deemed medically stable for discharge with proper follow-up. Follow-up appointments were arranged and patient was [facility] from our care.   Issues for Follow Up:  - Note continued improvement of Vaginal Bleeding on Norethindrone.  Significant Procedures: Stress Test; Cardiac Cath  Significant Labs and Imaging:   Recent Labs Lab 04/20/14 0024 04/21/14 0244 04/22/14 0500  WBC 9.6 9.3 7.3  HGB 9.8* 10.2* 9.8*  HCT 30.8* 32.5* 30.9*  PLT 207 227 201    Recent Labs Lab 04/18/14 1721 04/19/14 0810  NA 139 136  K 3.4* 4.2  CL 108 109  CO2 25 22  GLUCOSE 111* 88  BUN 13 12  CREATININE 0.99 0.79  CALCIUM 8.9 8.5  - Troponin 0.04>0.08>0.09>0.13> 0.08 - BNP 370.4 - D-Dimer <0.27 - Lipid Panel- HDL 29 - TSH 1.171 - A1C 5.9  Results/Tests Pending at Time of Discharge: None   Discharge Medications:    Medication List    STOP taking these medications        lisinopril-hydrochlorothiazide 20-25 MG per tablet  Commonly known as:  PRINZIDE,ZESTORETIC      TAKE these medications        aspirin 81 MG EC tablet  Take 81 mg by mouth daily.     diclofenac 75 MG EC tablet  Commonly known as:  VOLTAREN  Take 1 tablet (75 mg total) by mouth 2 (two) times daily.     furosemide 20 MG tablet  Commonly known as:  LASIX  Take 20-40 mg by mouth daily. Take two tablets if feeling fluid overloaded.  Otherwise just take one tablet daily.     lisinopril 20 MG tablet  Commonly known as:  PRINIVIL,ZESTRIL  Take 1 tablet (20 mg total) by mouth daily.     metoprolol succinate 25 MG  24 hr tablet  Commonly known as:  TOPROL-XL  TAKE 1 TABLET (25 MG TOTAL) BY MOUTH DAILY.     multivitamin tablet  Take 1 tablet by mouth daily.     norethindrone 5 MG tablet  Commonly known as:  AYGESTIN  Take 5 mg by mouth daily.     potassium chloride SA 20 MEQ tablet  Commonly known as:  K-DUR,KLOR-CON  Take 20 mEq by mouth daily.     traMADol 50 MG tablet  Commonly  known as:  ULTRAM  Take 1 tablet (50 mg total) by mouth every 6 (six) hours as needed.        Discharge Instructions: Please refer to Patient Instructions section of EMR for full details.  Patient was counseled important signs and symptoms that should prompt return to medical care, changes in medications, dietary instructions, activity restrictions, and follow up appointments.   Follow-Up Appointments: Follow-up Information    Follow up with Andrena Mews, MD On 05/01/2014.   Specialty:  Family Medicine   Why:  @9 :45am   Contact information:   [place] [address] [phone]       Follow up with Kirk Ruths, MD On 05/15/2014.   Specialty:  Cardiology   Why:  8:15   Contact information:   [place] [address] [phone]       Elberta Leatherwood, MD 04/22/2014, 3:44 PM PGY-1, [place]

## 2014-04-20 NOTE — Progress Notes (Signed)
Patient informed of the abnormal stress test finding. I have informed of her the benefit and risk of cardiac catheterization tomorrow, include bleeding, renal/vascular injury, arrhythmia, MI, stroke, loss of limb or life. She displayed clear understanding and agree to proceed with the procedure.   Pre cath ordered placed, last echo in July shows preserved EF, however Myoview says EF 37%, will be cautious with IVF hydration overnight. Over 1 bag of IVF start at 4 AM tomorrow AM at 75 ml per hr.  Hilbert Corrigan PA Pager: [phone]

## 2014-04-20 NOTE — Progress Notes (Signed)
Lexiscan MV performed 

## 2014-04-20 NOTE — Progress Notes (Signed)
[place] for Heparin  Indication: chest pain/ACS  Allergies  Allergen Reactions  . Codeine Phosphate Rash    itching    Patient Measurements: Height: 5\' 5"  (165.1 cm) Weight: 199 lb 4.8 oz (90.402 kg) IBW/kg (Calculated) : 57 Heparin Dosing Weight: ~76 kg  Vital Signs: Temp: 98.2 F (36.8 C) (12/27 1934) Temp Source: Oral (12/27 1934) BP: 112/64 mmHg (12/27 2315) Pulse Rate: 66 (12/27 1800)  Labs:  Recent Labs  04/18/14 1721 04/18/14 2110  04/19/14 0317 04/19/14 0725 04/19/14 0810 04/19/14 1512 04/19/14 1705 04/20/14 0024  HGB 10.4*  --   --   --  9.6*  --   --   --  9.8*  HCT 31.8*  --   --   --  30.9*  --   --   --  30.8*  PLT 228  --   --   --  207  --   --   --  207  LABPROT  --  14.6  --   --   --   --   --   --   --   INR  --  1.13  --   --   --   --   --   --   --   HEPARINUNFRC  --   --   --   --   --  0.26*  --  0.19* 0.36  CREATININE 0.99  --   --   --   --  0.79  --   --   --   TROPONINI  --   --   < > 0.09*  --  0.13* 0.08*  --   --   < > = values in this interval not displayed.  Estimated Creatinine Clearance: 92.5 mL/min (by C-G formula based on Cr of 0.79).  Assessment: 53 y.o. female with chest pain for heparin   Goal of Therapy:  Heparin level 0.3-0.7 units/ml Monitor platelets by anticoagulation protocol: Yes   Plan:  Continue Heparin at current rate   Phillis Knack, PharmD, BCPS  04/20/2014 1:48 AM

## 2014-04-20 NOTE — Progress Notes (Signed)
Family Medicine Teaching Service Daily Progress Note Intern Pager: [phone]  Patient name: [name] Medical record number: [id number] Date of birth: [birth year] Age: 53 y.o. Gender: female  Primary Care Provider: Andrena Mews, MD Consultants: Cardiology Code Status: Full  Assessment and Plan: [name] is a 53 y.o. female presenting with chest pain and shortness of breath on exertion. PMH is significant for dCHF grade II, HOCM, anxiety, HTN, anemia, and GERD.  # Chest Pain:  - EKG- unchanged with biatrial enlargement and left ventricular hypertrophy with repolarization abnormality  - Troponin 0.04>0.08>0.09>0.13>0.08 - BNP 370.4 - D-Dimer <0.27 - Lipid Panel- HDL 29 - TSH 1.171 - A1C pending - CXR- mild bronchitic changes  - Last echo 10/2013 showed vigrous LV function with severe apical hypertrophy, grade 2 diastolic dysfunction, mild left atrial enlargement, mild mitral regurgitation, and findings consistent with apical hypertrophic cardiomyopathy. - Aspirin 325mg  - Morphine q2hr PRN - Nitro Drip DCd 12/27 >> Nitro tablet 0.4mg  sublingual Q56min PRN - Heparin Drip per pharmacy - GI Cocktail (Maalox, Lidocaine, Donnatal) q4 PRN - Cardiology Consulted. Appreciate recommendations.  - Stress test performed 12/28 >> results pending  # HTN- Home Medications: Lisinopril-HCTZ 20-25mg  - Continue home medications - Well controlled today. BP 125/65 - Will continue to monitor.  # H/O Hypokalemia- Home Medication Klor-Con 93mEq daily with PRN lasix - Potassium 3.4 >> 4.2 (12/27) - KDur 67mEq x1 >> complete - Will monitor with BMP   # Vaginal Bleeding- Recently started on Norethindrone 5mg  on 04/15/14 - Will continue home medications - Monitor closely for worsening on heparin drip - Follow with CBCs. Hemoglobin 9.6  FEN/GI: NPO, Saline Lock, Zofran PRN Prophylaxis: Subcutaneous Heparin  Disposition: Admitted to [facility] Medicine Teaching Service.   Subjective:   No acute complaints overnight. Is not currently experiencing chest pain. Has already had stress test. Reports experiencing some significant pain during procedure. No results yet. No further complaints today.  Objective: Temp:  [97.8 F (36.6 C)-98.2 F (36.8 C)] 97.8 F (36.6 C) (12/28 1135) Pulse Rate:  [60-90] 90 (12/28 1023) Resp:  [17-23] 23 (12/28 1135) BP: (110-159)/(46-84) 138/46 mmHg (12/28 1135) SpO2:  [97 %-100 %] 97 % (12/28 1135) Physical Exam: General: 53yo female resting comfortably in no apparent distress Cardiovascular: S1 and S2 noted. Systolic murmur noted. Regular rate and rhythm. Respiratory: Clear to auscultation bilaterally. No wheezes, rales, or rhonchi. No increased work of breathing. Abdomen: Bowel sounds noted. Soft and nondistended. No tenderness or masses to palpation. Extremities: 1+ pitting edema bilaterally  Laboratory:  Recent Labs Lab 04/18/14 1721 04/19/14 0725 04/20/14 0024  WBC 11.8* 8.8 9.6  HGB 10.4* 9.6* 9.8*  HCT 31.8* 30.9* 30.8*  PLT 228 207 207    Recent Labs Lab 04/18/14 1721 04/19/14 0810  NA 139 136  K 3.4* 4.2  CL 108 109  CO2 25 22  BUN 13 12  CREATININE 0.99 0.79  CALCIUM 8.9 8.5  GLUCOSE 111* 88   Lipid Panel     Component Value Date/Time   CHOL 138 04/19/2014 0725   TRIG 96 04/19/2014 0725   HDL 29* 04/19/2014 0725   CHOLHDL 4.8 04/19/2014 0725   VLDL 19 04/19/2014 0725   LDLCALC 90 04/19/2014 0725  - Troponin 0.04>0.08>0.09 - D Dimer <0.27 - BNP 370.4  Imaging/Diagnostic Tests: Dg Chest 2 View  04/18/2014   CLINICAL DATA:  Shortness of breath, chest pain with exertion for 1 week. History of CHF, cardiomyopathy, hypertension,  EXAM: CHEST  2 VIEW  COMPARISON:  12/21/2011  FINDINGS: Heart is borderline in size. Mild peribronchial thickening. No confluent opacities or effusions. No acute bony abnormality.  IMPRESSION: Borderline heart size.  Mild bronchitic changes.   Electronically Signed   By: Rolm Baptise M.D.   On: 04/18/2014 20:01   Elberta Leatherwood, MD 04/20/2014, 1:50 PM PGY-1, [place] Intern pager: [phone], text pages welcome

## 2014-04-20 NOTE — Progress Notes (Signed)
     SUBJECTIVE: Chest pain during infusion of Lexiscan.   BP 121/62 mmHg  Pulse 74  Temp(Src) 98.1 F (36.7 C) (Oral)  Resp 23  Ht 5\' 5"  (1.651 m)  Wt 199 lb 4.8 oz (90.402 kg)  BMI 33.17 kg/m2  SpO2 98%  LMP 03/18/2014  Intake/Output Summary (Last 24 hours) at 04/20/14 0911 Last data filed at 04/20/14 0800  Gross per 24 hour  Intake 1488.14 ml  Output      0 ml  Net 1488.14 ml    PHYSICAL EXAM General: Well developed, well nourished, in no acute distress. Alert and oriented x 3.  Psych:  Good affect, responds appropriately Neck: No JVD. No masses noted.  Lungs: Clear bilaterally with no wheezes or rhonci noted.  Heart: RRR with no murmurs noted. Abdomen: Bowel sounds are present. Soft, non-tender.  Extremities: No lower extremity edema.   LABS: Basic Metabolic Panel:  Recent Labs  04/18/14 1721 04/19/14 0810  NA 139 136  K 3.4* 4.2  CL 108 109  CO2 25 22  GLUCOSE 111* 88  BUN 13 12  CREATININE 0.99 0.79  CALCIUM 8.9 8.5   CBC:  Recent Labs  04/19/14 0725 04/20/14 0024  WBC 8.8 9.6  HGB 9.6* 9.8*  HCT 30.9* 30.8*  MCV 78.2 80.4  PLT 207 207   Cardiac Enzymes:  Recent Labs  04/19/14 0317 04/19/14 0810 04/19/14 1512  TROPONINI 0.09* 0.13* 0.08*   Fasting Lipid Panel:  Recent Labs  04/19/14 0725  CHOL 138  HDL 29*  LDLCALC 90  TRIG 96  CHOLHDL 4.8    Current Meds: . aspirin EC  325 mg Oral Daily  . hydrochlorothiazide  25 mg Oral Daily  . lisinopril  20 mg Oral Daily  . metoprolol  5 mg Intravenous 4 times per day  . norethindrone  5 mg Oral Daily  . pneumococcal 23 valent vaccine  0.5 mL Intramuscular Tomorrow-1000    ASSESSMENT AND PLAN:  1. Chest pain/Elevated troponin: Her troponin has subtle elevation (peak 0.13, now 0.09). She is known to have normal coronary arteries by cardiac cath July 2009. This does not likely represent an acute coronary syndrome. This may be associated with her known apical variant hypertrophic  cardiomyopathy. She is having a stress test this am. I have seen her in [place]. Will follow up later today. If her stress test is abnormal, will need repeat cardiac cath tomorrow.   2. Apical hypertrophic cardiomyopathy: Continue current meds.     Queenie Aufiero  12/28/20159:11 AM

## 2014-04-21 ENCOUNTER — Encounter (HOSPITAL_COMMUNITY)
Admission: EM | Disposition: A | Discharge status: Home / Self Care | Payer: Self-pay | Source: Home / Self Care | Attending: Family Medicine

## 2014-04-21 ENCOUNTER — Encounter (HOSPITAL_COMMUNITY): Payer: Self-pay | Admitting: Cardiovascular Disease

## 2014-04-21 DIAGNOSIS — R079 Chest pain, unspecified: Secondary | ICD-10-CM

## 2014-04-21 HISTORY — PX: LEFT HEART CATHETERIZATION WITH CORONARY/GRAFT ANGIOGRAM: SHX5450

## 2014-04-21 LAB — CBC
HCT: 32.5 % — ABNORMAL LOW (ref 36.0–46.0)
Hemoglobin: 10.2 g/dL — ABNORMAL LOW (ref 12.0–15.0)
MCH: 24.6 pg — ABNORMAL LOW (ref 26.0–34.0)
MCHC: 31.4 g/dL (ref 30.0–36.0)
MCV: 78.3 fL (ref 78.0–100.0)
PLATELETS: 227 10*3/uL (ref 150–400)
RBC: 4.15 MIL/uL (ref 3.87–5.11)
RDW: 16.3 % — AB (ref 11.5–15.5)
WBC: 9.3 10*3/uL (ref 4.0–10.5)

## 2014-04-21 LAB — PREGNANCY, URINE: Preg Test, Ur: NEGATIVE

## 2014-04-21 LAB — HEPARIN LEVEL (UNFRACTIONATED): Heparin Unfractionated: 0.25 IU/mL — ABNORMAL LOW (ref 0.30–0.70)

## 2014-04-21 SURGERY — LEFT HEART CATHETERIZATION WITH CORONARY/GRAFT ANGIOGRAM
Anesthesia: LOCAL

## 2014-04-21 MED ORDER — ACETAMINOPHEN 325 MG PO TABS: 650.0000 mg | ORAL_TABLET | ORAL | Status: DC | PRN

## 2014-04-21 MED ORDER — ASPIRIN EC 81 MG PO TBEC
81.0000 mg | DELAYED_RELEASE_TABLET | Freq: Every day | ORAL | Status: DC
  Administered 2014-04-22: 81 mg via ORAL
  Filled 2014-04-21: qty 1

## 2014-04-21 MED ORDER — MIDAZOLAM HCL 2 MG/2ML IJ SOLN
INTRAMUSCULAR | Status: AC
  Filled 2014-04-21: qty 2

## 2014-04-21 MED ORDER — HEPARIN SODIUM (PORCINE) 1000 UNIT/ML IJ SOLN
INTRAMUSCULAR | Status: AC
  Filled 2014-04-21: qty 1

## 2014-04-21 MED ORDER — SODIUM CHLORIDE 0.9 % IV SOLN
INTRAVENOUS | Status: DC
  Administered 2014-04-21: 20:00:00 via INTRAVENOUS

## 2014-04-21 MED ORDER — FENTANYL CITRATE 0.05 MG/ML IJ SOLN
INTRAMUSCULAR | Status: AC
  Filled 2014-04-21: qty 2

## 2014-04-21 MED ORDER — ONDANSETRON HCL 4 MG/2ML IJ SOLN: 4.0000 mg | Freq: Four times a day (QID) | INTRAMUSCULAR | Status: DC | PRN

## 2014-04-21 MED ORDER — NITROGLYCERIN 1 MG/10 ML FOR IR/CATH LAB
INTRA_ARTERIAL | Status: AC
  Filled 2014-04-21: qty 10

## 2014-04-21 MED ORDER — HEPARIN (PORCINE) IN NACL 2-0.9 UNIT/ML-% IJ SOLN
INTRAMUSCULAR | Status: AC
  Filled 2014-04-21: qty 1000

## 2014-04-21 MED ORDER — VERAPAMIL HCL 2.5 MG/ML IV SOLN
INTRAVENOUS | Status: AC
  Filled 2014-04-21: qty 2

## 2014-04-21 MED ORDER — LIDOCAINE HCL (PF) 1 % IJ SOLN
INTRAMUSCULAR | Status: AC
  Filled 2014-04-21: qty 30

## 2014-04-21 NOTE — CV Procedure (Signed)
[name] is a 53 y.o. female    [id number]  [id number] LOCATION:  FACILITY: [facility]  PHYSICIAN: Troy Sine, MD, [facility] April 24, 1961   DATE OF PROCEDURE:  04/21/2014    CARDIAC CATHETERIZATION     HISTORY:    [name] is a 53 y.o. female with a history of apical hypertrophy and prior cardiac catheterization in July 2009 revealed normal coronary arteries.  He was recently admitted with significant substernal chest pain with neck radiation associated with shortness of breath.  Troponin was mildly positive.  Her ECG showed LVH with deep T-wave inversion consistent with her apical hypertrophy.  A nuclear perfusion study was interpreted as high risk with defects in the mid distal anterior walls, anterolateral wall, as well as an inferior wall fixed defect with an overall ejection fraction of 37%.  She is referred for definitive cardiac catheterization.   PROCEDURE: Left heart catheterization: coronary angiography, left ventriculography   The patient was brought to the [facility] cardiac catherization laboratory in the fasting state.  She was premedicated with Versed 2 mg and fentanyl 25 g initially.  Initial attempt was made at performing the procedure via the right radial approach.  Despite multiple attempts at cannulating and obtaining right radial access, these were unsuccessful.  At this point, decision was made to transition to a femoral approach.  Her right groin was prepped and shaved in usual sterile fashion. Xylocaine 1% was used for local anesthesia. A 5 French sheath was inserted into the R femoral artery. Diagnostic catheterizatiion was done with 5 [place] FL4, FR4, and pigtail catheters. Left ventriculography was done with 25 cc cc Omnipaque contrast. Hemostasis was obtained by direct manual compression. The patient tolerated the procedure well.   HEMODYNAMICS:   Central Aorta: 110/58   Left Ventricle: 110/10/24  ANGIOGRAPHY:  Left main: Angiographically  normal.  Large vessel which bifurcated into a large LAD and left circumflex coronary artery.   LAD: Large angiographically normal vessel which gave rise to two proximal diagonal vessels.  The LAD extended to the apex and then bifurcated and supplied a large portion of the distal inferior wall and inferolateral wall via its bifurcating branches.  Left circumflex: Moderate size angiographically normal co-dominant vessel which gave rise to 2 major marginal branches.  Right coronary artery: Normal vessel.  Left ventriculography revealed  marked left ventricular hypertrophy with significant apical hypertrophy.  There was hyperdynamic LV contractility with apical cavity obliteration.   IMPRESSION:  Apical variant hypertrophic cardiomyopathy with marked apical hypertrophy and hyperdynamic LV function.  Normal coronary arteries.  RECOMMENDATION:  Medical therapy.  The patient's nuclear findings may be secondary to significant myofibril disarray secondary to her marked nonobstructive apical variant hypertrophic cardiomyopathy.    Troy Sine, MD, [facility] 04/21/2014 7:20 PM

## 2014-04-21 NOTE — Progress Notes (Signed)
Family Medicine Teaching Service Daily Progress Note Intern Pager: [phone]  Patient name: [name] Medical record number: [id number] Date of birth: [birth year] Age: 53 y.o. Gender: female  Primary Care Provider: Andrena Mews, MD Consultants: Cardiology Code Status: Full  Assessment and Plan: [name] is a 53 y.o. female presenting with chest pain and shortness of breath on exertion. PMH is significant for dCHF grade II, HOCM, anxiety, HTN, anemia, and GERD.  # Chest Pain:  - EKG- unchanged with biatrial enlargement and left ventricular hypertrophy with repolarization abnormality  - Troponin 0.04>0.08>0.09>0.13>0.08 - BNP 370.4 - D-Dimer <0.27 - Lipid Panel- HDL 29 - TSH 1.171 - A1C 5.9 - CXR- mild bronchitic changes  - Last echo 10/2013 showed vigrous LV function with severe apical hypertrophy, grade 2 diastolic dysfunction, mild left atrial enlargement, mild mitral regurgitation, and findings consistent with apical hypertrophic cardiomyopathy. - Aspirin 325mg  - Morphine q2hr PRN - Nitro Drip DCd 12/27 >> Nitro tablet 0.4mg  sublingual Q18min PRN - Heparin Drip per pharmacy - GI Cocktail (Maalox, Lidocaine, Donnatal) q4 PRN - Cardiology Consulted. Appreciate recommendations.  - Stress test performed 12/28 >> high risk (further details listed in "imaging" section)  # HTN- Home Medications: Lisinopril-HCTZ 20-25mg  - Continue home medications - Well controlled today. BP 125/65 - Will continue to monitor.  # H/O Hypokalemia- Home Medication Klor-Con 39mEq daily with PRN lasix - Potassium 3.4 >> 4.2 (12/27) - KDur 60mEq x1 >> complete - Will monitor with BMP   # Vaginal Bleeding- Recently started on Norethindrone 5mg  on 04/15/14 - Will continue home medications - Monitor closely for worsening on heparin drip - Follow with CBCs. Hemoglobin 9.6  FEN/GI: NPO, Saline Lock, Zofran PRN Prophylaxis: Subcutaneous Heparin  Disposition: Admitted to [facility] Medicine  Teaching Service.   Subjective:  No acute complaints overnight. Is not currently experiencing chest pain. Hungry 2/2 NPO for cardiac cath later today. No further complaints today.  Does report some continued mild vaginal bleeding. Says this is much improved. Patient on Hep drip. Will consider DC this if Hgb drops or bleeding increases.  Objective: Temp:  [97.7 F (36.5 C)-98.3 F (36.8 C)] 98 F (36.7 C) (12/29 0742) Pulse Rate:  [59-90] 59 (12/29 0742) Resp:  [10-23] 23 (12/29 0742) BP: (112-157)/(34-70) 112/55 mmHg (12/29 0742) SpO2:  [97 %-100 %] 98 % (12/29 0742) Weight:  [199 lb 8 oz (90.493 kg)] 199 lb 8 oz (90.493 kg) (12/28 2133) Physical Exam: General: 53yo female resting comfortably in no apparent distress Cardiovascular: S1 and S2 noted. Systolic murmur noted. Regular rate and rhythm. Respiratory: Clear to auscultation bilaterally. No wheezes, rales, or rhonchi. No increased work of breathing. Abdomen: Bowel sounds noted. Soft and nondistended. No tenderness or masses to palpation. Extremities: 1+ pitting edema bilaterally  Laboratory:  Recent Labs Lab 04/19/14 0725 04/20/14 0024 04/21/14 0244  WBC 8.8 9.6 9.3  HGB 9.6* 9.8* 10.2*  HCT 30.9* 30.8* 32.5*  PLT 207 207 227    Recent Labs Lab 04/18/14 1721 04/19/14 0810  NA 139 136  K 3.4* 4.2  CL 108 109  CO2 25 22  BUN 13 12  CREATININE 0.99 0.79  CALCIUM 8.9 8.5  GLUCOSE 111* 88   Lipid Panel     Component Value Date/Time   CHOL 138 04/19/2014 0725   TRIG 96 04/19/2014 0725   HDL 29* 04/19/2014 0725   CHOLHDL 4.8 04/19/2014 0725   VLDL 19 04/19/2014 0725   LDLCALC 90 04/19/2014 0725  - Troponin 0.04>0.08>0.09 -  D Dimer <0.27 - BNP 370.4  Imaging/Diagnostic Tests: Dg Chest 2 View  04/18/2014   CLINICAL DATA:  Shortness of breath, chest pain with exertion for 1 week. History of CHF, cardiomyopathy, hypertension,  EXAM: CHEST  2 VIEW  COMPARISON:  12/21/2011  FINDINGS: Heart is borderline in  size. Mild peribronchial thickening. No confluent opacities or effusions. No acute bony abnormality.  IMPRESSION: Borderline heart size.  Mild bronchitic changes.   Electronically Signed   By: Rolm Baptise M.D.   On: 04/18/2014 20:01   Nm Myocar Multi W/spect W/wall Motion / Ef  04/20/2014   CLINICAL DATA:  Chest pain  EXAM: MYOCARDIAL IMAGING WITH SPECT (REST AND PHARMACOLOGIC-STRESS)  GATED LEFT VENTRICULAR WALL MOTION STUDY  LEFT VENTRICULAR EJECTION FRACTION  TECHNIQUE: Standard myocardial SPECT imaging was performed after resting intravenous injection of 10 mCi Tc-15m sestamibi. Subsequently, intravenous infusion of Lexiscan was performed under the supervision of the Cardiology staff. At peak effect of the drug, 30 mCi Tc-47m sestamibi was injected intravenously and standard myocardial SPECT imaging was performed. Quantitative gated imaging was also performed to evaluate left ventricular wall motion, and estimate left ventricular ejection fraction.  COMPARISON:  None.  FINDINGS: Baseline EKG: NSR with marked LVH and ST/T wave changes consistent with repolarization changes. The patient had CP and SOB during the infusion. EKG indeterminant for ischemia due to baseline EKG changes.  RAW images demonstrate mild diaphragmatic attenuation and increased gut uptake below the diaphragm.  Perfusion: There is a small in size, mild in intensity reversible defect in the mid and distal anterior wall. There is a small sized, mild reversible defect in the anterolateral wall. There is also a small in size, moderate in intensity defect in the inferior mid and basal wall that is partially fixed and could be due to variations in diaphragmatic attenuation. Concerning for mild ischemia in the mid and distal anterior and anterolateral walls and possibly inferior wall.  Wall Motion: There is dyskinesis of the mid and distal inferior wall. Moderate LV dysfunction.  Left Ventricular Ejection Fraction: 78% %  End diastolic volume 87  ml  End systolic volume 52 ml  IMPRESSION: 1. Small in size, mild in intensity reversible defects in the mid and distal anterior and anterolateral walls concerning for ischemia. There is also a small sized, moderate partially fixed defect in the inferior wall with a small area of ischemia vs. Variations in diaphragmatic attenuation.  2.  Moderate LV dysfunction with mid and distal inferior dyskinesis.  3. Left ventricular ejection fraction 37%  4. High-risk stress test findings*.  *2012 Appropriate Use Criteria for Coronary Revascularization Focused Update: J Am Coll Cardiol. 5885;02(7):741-287. http://content.airportbarriers.com.aspx?articleid=1201161   Electronically Signed   By: Fransico Him   On: 04/20/2014 16:14   NM Myocar Stress Test 12/28 IMPRESSION: 1. Small in size, mild in intensity reversible defects in the mid and distal anterior and anterolateral walls concerning for ischemia. There is also a small sized, moderate partially fixed defect in the inferior wall with a small area of ischemia vs. Variations in diaphragmatic attenuation.  2. Moderate LV dysfunction with mid and distal inferior dyskinesis.  3. Left ventricular ejection fraction 37%  4. High-risk stress test findings*.     Elberta Leatherwood, MD 04/21/2014, 10:20 AM PGY-1, [place] Intern pager: [phone], text pages welcome

## 2014-04-21 NOTE — Progress Notes (Signed)
ANTICOAGULATION CONSULT NOTE - Follow Up Consult  Pharmacy Consult for Heparin  Indication: chest pain/ACS  Allergies  Allergen Reactions  . Codeine Phosphate Rash    itching    Patient Measurements: Height: 5\' 5"  (165.1 cm) Weight: 199 lb 4.8 oz (90.402 kg) IBW/kg (Calculated) : 57  Vital Signs: Temp: 97.9 F (36.6 C) (12/29 0300) Temp Source: Oral (12/29 0300) BP: 130/34 mmHg (12/29 0300) Pulse Rate: 63 (12/29 0300)  Labs:  Recent Labs  04/18/14 1721 04/18/14 2110  04/19/14 0317 04/19/14 0725  04/19/14 0810 04/19/14 1512 04/19/14 1705 04/20/14 0024 04/21/14 0244  HGB 10.4*  --   --   --  9.6*  --   --   --   --  9.8* 10.2*  HCT 31.8*  --   --   --  30.9*  --   --   --   --  30.8* 32.5*  PLT 228  --   --   --  207  --   --   --   --  207 227  LABPROT  --  14.6  --   --   --   --   --   --   --   --   --   INR  --  1.13  --   --   --   --   --   --   --   --   --   HEPARINUNFRC  --   --   --   --   --   < > 0.26*  --  0.19* 0.36 0.25*  CREATININE 0.99  --   --   --   --   --  0.79  --   --   --   --   TROPONINI  --   --   < > 0.09*  --   --  0.13* 0.08*  --   --   --   < > = values in this interval not displayed.  Estimated Creatinine Clearance: 92.5 mL/min (by C-G formula based on Cr of 0.79).  Assessment: 53 y/o on heparin for CP, cath later today, sub-therapeutic heparin level, no issues per RN.   Goal of Therapy:  Heparin level 0.3-0.7 units/ml Monitor platelets by anticoagulation protocol: Yes   Plan:  -Increase heparin to 1500 units/hr -1200 HL, depending on cath time -Daily CBC/HL -Monitor for bleeding  Narda Bonds 04/21/2014,4:31 AM

## 2014-04-21 NOTE — H&P (View-Only) (Signed)
     SUBJECTIVE: No chest pain this am. No SOB.   Tele: NSR  BP 112/55 mmHg  Pulse 59  Temp(Src) 98 F (36.7 C) (Oral)  Resp 23  Ht 5\' 5"  (1.651 m)  Wt 199 lb 8 oz (90.493 kg)  BMI 33.20 kg/m2  SpO2 98%  LMP 03/18/2014  Intake/Output Summary (Last 24 hours) at 04/21/14 1002 Last data filed at 04/21/14 0800  Gross per 24 hour  Intake 416.53 ml  Output      0 ml  Net 416.53 ml    PHYSICAL EXAM General: Well developed, well nourished, in no acute distress. Alert and oriented x 3.  Psych:  Good affect, responds appropriately Neck: No JVD. No masses noted.  Lungs: Clear bilaterally with no wheezes or rhonci noted.  Heart: RRR with slight systolic murmur noted. Abdomen: Bowel sounds are present. Soft, non-tender.  Extremities: No lower extremity edema.   LABS: Basic Metabolic Panel:  Recent Labs  04/18/14 1721 04/19/14 0810  NA 139 136  K 3.4* 4.2  CL 108 109  CO2 25 22  GLUCOSE 111* 88  BUN 13 12  CREATININE 0.99 0.79  CALCIUM 8.9 8.5   CBC:  Recent Labs  04/20/14 0024 04/21/14 0244  WBC 9.6 9.3  HGB 9.8* 10.2*  HCT 30.8* 32.5*  MCV 80.4 78.3  PLT 207 227   Cardiac Enzymes:  Recent Labs  04/19/14 0317 04/19/14 0810 04/19/14 1512  TROPONINI 0.09* 0.13* 0.08*   Fasting Lipid Panel:  Recent Labs  04/19/14 0725  CHOL 138  HDL 29*  LDLCALC 90  TRIG 96  CHOLHDL 4.8    Current Meds: . aspirin EC  325 mg Oral Daily  . hydrochlorothiazide  25 mg Oral Daily  . lisinopril  20 mg Oral Daily  . metoprolol  5 mg Intravenous 4 times per day  . norethindrone  5 mg Oral Daily  . pneumococcal 23 valent vaccine  0.5 mL Intramuscular Tomorrow-1000  . sodium chloride  3 mL Intravenous Q12H   Stress myoview 04/20/14:  1. Small in size, mild in intensity reversible defects in the mid and distal anterior and anterolateral walls concerning for ischemia. There is also a small sized, moderate partially fixed defect in the inferior wall with a small  area of ischemia vs. Variations in diaphragmatic attenuation. 2. Moderate LV dysfunction with mid and distal inferior dyskinesis. 3. Left ventricular ejection fraction 37% 4. High-risk stress test findings*.  ASSESSMENT AND PLAN:  1. Chest pain/Elevated troponin: Her troponin has subtle elevation (peak 0.13 with flat trend). She is known to have normal coronary arteries by cardiac cath July 2009. Given her presentation, stress myoview was arranged on 04/20/14 and was read as high risk with reversible defects in the mid and distal anterior walls and anterolateral walls with inferior wall fixed defect, overall LVEF 37%. This may be associated with her known apical variant hypertrophic cardiomyopathy. Will need cardiac cath to provide definitive assessment of her coronary arteries. She is NPO this am for cardiac cath this afternoon.  Risks and benefits of cath reviewed with pt. She agrees to proceed.   2. Apical hypertrophic cardiomyopathy: Continue current meds.    MCALHANY,CHRISTOPHER  12/29/201510:02 AM

## 2014-04-21 NOTE — Interval H&P Note (Signed)
Cath Lab Visit (complete for each Cath Lab visit)  Clinical Evaluation Leading to the Procedure:   ACS: Yes.    Non-ACS:    Anginal Classification: CCS III  Anti-ischemic medical therapy: Maximal Therapy (2 or more classes of medications)  Non-Invasive Test Results: High-risk stress test findings: cardiac mortality >3%/year  Prior CABG: No previous CABG      History and Physical Interval Note:  04/21/2014 6:20 PM  [name]  has presented today for surgery, with the diagnosis of c/p  The various methods of treatment have been discussed with the patient and family. After consideration of risks, benefits and other options for treatment, the patient has consented to  Procedure(s): LEFT HEART CATHETERIZATION WITH CORONARY/GRAFT ANGIOGRAM (N/A) as a surgical intervention .  The patient's history has been reviewed, patient examined, no change in status, stable for surgery.  I have reviewed the patient's chart and labs.  Questions were answered to the patient's satisfaction.     KELLY,THOMAS A

## 2014-04-21 NOTE — Progress Notes (Signed)
     SUBJECTIVE: No chest pain this am. No SOB.   Tele: NSR  BP 112/55 mmHg  Pulse 59  Temp(Src) 98 F (36.7 C) (Oral)  Resp 23  Ht 5\' 5"  (1.651 m)  Wt 199 lb 8 oz (90.493 kg)  BMI 33.20 kg/m2  SpO2 98%  LMP 03/18/2014  Intake/Output Summary (Last 24 hours) at 04/21/14 1002 Last data filed at 04/21/14 0800  Gross per 24 hour  Intake 416.53 ml  Output      0 ml  Net 416.53 ml    PHYSICAL EXAM General: Well developed, well nourished, in no acute distress. Alert and oriented x 3.  Psych:  Good affect, responds appropriately Neck: No JVD. No masses noted.  Lungs: Clear bilaterally with no wheezes or rhonci noted.  Heart: RRR with slight systolic murmur noted. Abdomen: Bowel sounds are present. Soft, non-tender.  Extremities: No lower extremity edema.   LABS: Basic Metabolic Panel:  Recent Labs  04/18/14 1721 04/19/14 0810  NA 139 136  K 3.4* 4.2  CL 108 109  CO2 25 22  GLUCOSE 111* 88  BUN 13 12  CREATININE 0.99 0.79  CALCIUM 8.9 8.5   CBC:  Recent Labs  04/20/14 0024 04/21/14 0244  WBC 9.6 9.3  HGB 9.8* 10.2*  HCT 30.8* 32.5*  MCV 80.4 78.3  PLT 207 227   Cardiac Enzymes:  Recent Labs  04/19/14 0317 04/19/14 0810 04/19/14 1512  TROPONINI 0.09* 0.13* 0.08*   Fasting Lipid Panel:  Recent Labs  04/19/14 0725  CHOL 138  HDL 29*  LDLCALC 90  TRIG 96  CHOLHDL 4.8    Current Meds: . aspirin EC  325 mg Oral Daily  . hydrochlorothiazide  25 mg Oral Daily  . lisinopril  20 mg Oral Daily  . metoprolol  5 mg Intravenous 4 times per day  . norethindrone  5 mg Oral Daily  . pneumococcal 23 valent vaccine  0.5 mL Intramuscular Tomorrow-1000  . sodium chloride  3 mL Intravenous Q12H   Stress myoview 04/20/14:  1. Small in size, mild in intensity reversible defects in the mid and distal anterior and anterolateral walls concerning for ischemia. There is also a small sized, moderate partially fixed defect in the inferior wall with a small  area of ischemia vs. Variations in diaphragmatic attenuation. 2. Moderate LV dysfunction with mid and distal inferior dyskinesis. 3. Left ventricular ejection fraction 37% 4. High-risk stress test findings*.  ASSESSMENT AND PLAN:  1. Chest pain/Elevated troponin: Her troponin has subtle elevation (peak 0.13 with flat trend). She is known to have normal coronary arteries by cardiac cath July 2009. Given her presentation, stress myoview was arranged on 04/20/14 and was read as high risk with reversible defects in the mid and distal anterior walls and anterolateral walls with inferior wall fixed defect, overall LVEF 37%. This may be associated with her known apical variant hypertrophic cardiomyopathy. Will need cardiac cath to provide definitive assessment of her coronary arteries. She is NPO this am for cardiac cath this afternoon.  Risks and benefits of cath reviewed with pt. She agrees to proceed.   2. Apical hypertrophic cardiomyopathy: Continue current meds.    MCALHANY,CHRISTOPHER  12/29/201510:02 AM

## 2014-04-22 DIAGNOSIS — R778 Other specified abnormalities of plasma proteins: Secondary | ICD-10-CM | POA: Diagnosis present

## 2014-04-22 DIAGNOSIS — Z0389 Encounter for observation for other suspected diseases and conditions ruled out: Secondary | ICD-10-CM

## 2014-04-22 DIAGNOSIS — R7989 Other specified abnormal findings of blood chemistry: Secondary | ICD-10-CM

## 2014-04-22 DIAGNOSIS — R931 Abnormal findings on diagnostic imaging of heart and coronary circulation: Secondary | ICD-10-CM

## 2014-04-22 DIAGNOSIS — IMO0001 Reserved for inherently not codable concepts without codable children: Secondary | ICD-10-CM

## 2014-04-22 DIAGNOSIS — I422 Other hypertrophic cardiomyopathy: Principal | ICD-10-CM

## 2014-04-22 HISTORY — DX: Abnormal findings on diagnostic imaging of heart and coronary circulation: R93.1

## 2014-04-22 HISTORY — DX: Reserved for inherently not codable concepts without codable children: IMO0001

## 2014-04-22 LAB — CBC
HCT: 30.9 % — ABNORMAL LOW (ref 36.0–46.0)
HEMOGLOBIN: 9.8 g/dL — AB (ref 12.0–15.0)
MCH: 24.9 pg — AB (ref 26.0–34.0)
MCHC: 31.7 g/dL (ref 30.0–36.0)
MCV: 78.4 fL (ref 78.0–100.0)
Platelets: 201 10*3/uL (ref 150–400)
RBC: 3.94 MIL/uL (ref 3.87–5.11)
RDW: 16.2 % — ABNORMAL HIGH (ref 11.5–15.5)
WBC: 7.3 10*3/uL (ref 4.0–10.5)

## 2014-04-22 MED ORDER — METOPROLOL SUCCINATE ER 25 MG PO TB24
25.0000 mg | ORAL_TABLET | Freq: Every day | ORAL | Status: DC
  Filled 2014-04-22: qty 1

## 2014-04-22 MED ORDER — LISINOPRIL 20 MG PO TABS: 20.0000 mg | ORAL_TABLET | Freq: Every day | ORAL | Status: DC

## 2014-04-22 MED ORDER — POTASSIUM CHLORIDE CRYS ER 20 MEQ PO TBCR: 20.0000 meq | EXTENDED_RELEASE_TABLET | Freq: Every day | ORAL | Status: DC

## 2014-04-22 NOTE — Progress Notes (Signed)
UR completed Jalina Blowers K. Aleah Ahlgrim, RN, BSN, [place], CCM  04/22/2014 6:34 PM

## 2014-04-22 NOTE — Care Management Note (Addendum)
  Page 1 of 1   04/22/2014     6:37:20 PM CARE MANAGEMENT NOTE 04/22/2014  Patient:  [name], [name]   Account Number:  [id number]  Date Initiated:  04/22/2014  Documentation initiated by:  Colisha Redler  Subjective/Objective Assessment:   CP     Action/Plan:   CM to follow for disposition needs   Anticipated DC Date:  04/23/2014   Anticipated DC Plan:  HOME/SELF CARE         Choice offered to / List presented to:             Status of service:  Completed, signed off Medicare Important Message given?  NO (If response is "NO", the following Medicare IM given date fields will be blank) Date Medicare IM given:   Medicare IM given by:   Date Additional Medicare IM given:   Additional Medicare IM given by:    Discharge Disposition:  HOME/SELF CARE  Per UR Regulation:  Reviewed for med. necessity/level of care/duration of stay  If discussed at [place] of Stay Meetings, dates discussed:    Comments:  Vannary Greening RN, BSN, [place], CCM  Nurse - Case Manager,  (Unit [phone]  04/22/2014

## 2014-04-22 NOTE — Progress Notes (Signed)
    Subjective:  No chest pain  Objective:  Vital Signs in the last 24 hours: Temp:  [97.6 F (36.4 C)-98.2 F (36.8 C)] 97.9 F (36.6 C) (12/30 0830) Pulse Rate:  [60-68] 68 (12/30 0830) Resp:  [17-20] 18 (12/30 0830) BP: (110-145)/(41-86) 110/56 mmHg (12/30 0830) SpO2:  [97 %-100 %] 98 % (12/30 0830) Weight:  [199 lb 4.7 oz (90.4 kg)] 199 lb 4.7 oz (90.4 kg) (12/29 1954)  Intake/Output from previous day:  Intake/Output Summary (Last 24 hours) at 04/22/14 1017 Last data filed at 04/22/14 0700  Gross per 24 hour  Intake 1444.17 ml  Output      0 ml  Net 1444.17 ml    Physical Exam: General appearance: alert, cooperative, no distress and moderately obese Lungs: clear to auscultation bilaterally Heart: regular rate and rhythm and 2/6 systolic murmur Extremities: Rt groin without hematoma   Rate: 66  Rhythm: normal sinus rhythm  Lab Results:  Recent Labs  04/21/14 0244 04/22/14 0500  WBC 9.3 7.3  HGB 10.2* 9.8*  PLT 227 201   No results for input(s): NA, K, CL, CO2, GLUCOSE, BUN, CREATININE in the last 72 hours.  Recent Labs  04/19/14 1512  TROPONINI 0.08*   No results for input(s): INR in the last 72 hours.  Imaging: Imaging results have been reviewed  Cardiac Studies:  Assessment/Plan:  53 y.o. female with a history of apical hypertrophy and prior cardiac catheterization in July 2009 revealed normal coronary arteries. He was recently admitted with significant substernal chest pain with neck radiation associated with shortness of breath. Troponin was mildly positive. Her ECG showed LVH with deep T-wave inversion consistent with her apical hypertrophy. A nuclear perfusion study was interpreted as high risk with defects in the mid distal anterior walls, anterolateral wall, as well as an inferior wall fixed defect with an overall ejection fraction of 37%.Cath done 04/21/14 revealed normal coronaries.    Principal Problem:   Chest pain Active  Problems:   Hypertrophic obstructive cardiomyopathy   SOB (shortness of breath)   Elevated troponin   Abnormal nuclear cardiac imaging test   Essential hypertension   Normal coronary arteries- 2009 and Dec 2915   PLAN:  She is on IV Lopressor now- will change to home dose of Toprol 25 mg daily. Continue other home meds- Lisinopril, Lasix, and ASA.( I will ask pharmacy to correct her Med Rec) F/U Dr Stanford Breed after discharge arranged.   Kerin Ransom PA-C Beeper [phone] 04/22/2014, 10:17 AM   I have seen and examined the patient along with Kerin Ransom PA-C.  I have reviewed the chart, notes and new data.  I agree with PA's note.  Key new complaints: no chest pressure, slight dyspnea walking to bathroom Key examination changes: no groin complications or overt hypervolemia Key new findings / data: LVEDP 24, hyperdynamic LV, normal coronaries  PLAN: I think her symptoms are related to diastolic HF. Will ask her to record daily weights and take double usual furosemide dose when >192 lb on her home scale (she has noticed she feels better at that weight and sometimes will go up as high as 198 and feels worse). Bring weight log to office appt with Dr. Stanford Breed Jan 22.  Sanda Klein, MD, [place] [phone] 04/22/2014, 3:14 PM

## 2014-04-22 NOTE — Discharge Instructions (Signed)
As instructed by your hospital cardiologist, please create a "weight log" which we would like to have you bring to your appointment with your cardiologist on Jan. 22nd.  Also: you are to take twice your daily dose of Furosemide on any day you your weight increases above 192 lbs.   Hypertrophic Cardiomyopathy The heart has four chambers to pump blood throughout the body. In hypertrophic cardiomyopathy (HC), the heart muscle becomes enlarged and thickened. The septum is the muscular part of the heart which separates the chambers. When the septum enlarges it takes up part of the heart chamber. This causes a decrease in blood flow. A bigger septum affects the large left muscular chamber of the heart (left ventricle) and obstructs the blood flow. This may be called obstructive cardiomyopathy (HOCM). The degree of obstruction may vary and increase during exercise. Young athletes with a family history of heart problems should be screened for this problem. This disease will get worse without treatment. As the heart work increases, so do the size of the muscles. Get medical treatment as soon as possible. This disease can result in sudden death, especially after exercise. Diagnosing the problem and treating it is important.  SYMPTOMS   Shortness of breath.  Chest pain.  Irregular or fast heart beats.  Fainting (especially after exertion). DIAGNOSIS  Your caregiver will be able to determine what is wrong by taking a history to see what is bothering you and by doing a physical exam. Other tests may include:  An EKG which is a recording of the electrical activity of the heart. With HOCM this may show an enlargement of the left ventricle (left ventricular hypertrophy).  An echocardiogram may be done and is the test of choice for screening young athletes. It will usually show the enlargement of the left ventricle and slow filling of the chamber.  A doppler test shows irregular flow and a pressure gradient  change of both sides of the aortic valve. It also typically shows mitral regurgitation. This means that blood leaks backward through the mitral valve. This makes the heart work harder. TREATMENT  Your caregiver may prescribe medications to help this. Sometimes part of the heart septum may be removed with surgery or an alcohol ablation. In severe cases, pacemakers in both sides of the heart may be helpful in reducing the workload on the heart. PROGNOSIS   Atrial fibrillation may occur. This is a condition where the top chambers of the heart have stopped beating in a normal regular fashion.  Sudden death is another complication of untreated HC. SEEK IMMEDIATE MEDICAL CARE IF:   You develop chest pain, sweating, or shortness of breath, especially during or after sports.  You feel faint or pass out.  You have trouble breathing even at rest.  Your feet or ankles get swollen.  You feel palpitations or abnormal heartbeats. Document Released: 03/16/2004 Document Revised: 07/03/2011 Document Reviewed: 06/13/2013 [facility] Patient Information 2015 [place], [place]. This information is not intended to replace advice given to you by your health care provider. Make sure you discuss any questions you have with your health care provider.    Angina Pectoris Angina pectoris, often just called angina, is extreme discomfort in your chest, neck, or arm caused by a lack of blood in the middle and thickest layer of your heart wall (myocardium). It may feel like tightness or heavy pressure. It may feel like a crushing or squeezing pain. Some people say it feels like gas or indigestion. It may go down your  shoulders, back, and arms. Some people may have symptoms other than pain. These symptoms include fatigue, shortness of breath, cold sweats, or nausea. There are four different types of angina:  Stable angina--Stable angina usually occurs in episodes of predictable frequency and duration. It usually is brought on  by physical activity, emotional stress, or excitement. These are all times when the myocardium needs more oxygen. Stable angina usually lasts a few minutes and often is relieved by taking a medicine that can be taken under your tongue (sublingually). The medicine is called nitroglycerin. Stable angina is caused by a buildup of plaque inside the arteries, which restricts blood flow to the heart muscle (atherosclerosis).  Unstable angina--Unstable angina can occur even when your body experiences little or no physical exertion. It can occur during sleep. It can also occur at rest. It can suddenly increase in severity or frequency. It might not be relieved by sublingual nitroglycerin. It can last up to 30 minutes. The most common cause of unstable angina is a blood clot that has developed on the top of plaque buildup inside a coronary artery. It can lead to a heart attack if the blood clot completely blocks the artery.  Microvascular angina--This type of angina is caused by a disorder of tiny blood vessels called arterioles. Microvascular angina is more common in women. The pain may be more severe and last longer than other types of angina pectoris.  Prinzmetal or variant angina--This type of angina pectoris usually occurs when your body experiences little or no physical exertion. It especially occurs in the early morning hours. It is caused by a spasm of your coronary artery. HOME CARE INSTRUCTIONS   Only take over-the-counter and prescription medicines as directed by your health care provider.  Stay active or increase your exercise as directed by your health care provider.  Limit strenuous activity as directed by your health care provider.  Limit heavy lifting as directed by your health care provider.  Maintain a healthy weight.  Learn about and eat heart-healthy foods.  Do not use any tobacco products including cigarettes, chewing tobacco or electronic cigarettes. SEEK IMMEDIATE MEDICAL CARE IF:   You experience the following symptoms:  Chest, neck, deep shoulder, or arm pain or discomfort that lasts more than a few minutes.  Chest, neck, deep shoulder, or arm pain or discomfort that goes away and comes back, repeatedly.  Heavy sweating with discomfort, without a noticeable cause.  Shortness of breath or difficulty breathing.  Angina that does not get better after a few minutes of rest or after taking sublingual nitroglycerin. These can all be symptoms of a heart attack, which is a medical emergency! Get medical help at once. Call your local emergency service (911 in [place] immediately. Do not  drive yourself to the hospital and do not  wait to for your symptoms to go away. MAKE SURE YOU:  Understand these instructions.  Will watch your condition.  Will get help right away if you are not doing well or get worse. Document Released: 04/10/2005 Document Revised: 04/15/2013 Document Reviewed: 08/12/2013 [facility] Patient Information 2015 [place], [place]. This information is not intended to replace advice given to you by your health care provider. Make sure you discuss any questions you have with your health care provider.   record daily weights and take double usual furosemide dose when >192 lb on your home scale. Bring weight log to office appt with Dr. Stanford Breed Jan 22.

## 2014-05-01 ENCOUNTER — Encounter: Payer: Self-pay | Admitting: Family Medicine

## 2014-05-01 ENCOUNTER — Ambulatory Visit (INDEPENDENT_AMBULATORY_CARE_PROVIDER_SITE_OTHER): Payer: Medicare Other | Admitting: Family Medicine

## 2014-05-01 VITALS — BP 119/74 | HR 73 | Temp 98.2°F | Ht 65.0 in | Wt 203.0 lb

## 2014-05-01 DIAGNOSIS — E669 Obesity, unspecified: Secondary | ICD-10-CM

## 2014-05-01 DIAGNOSIS — I10 Essential (primary) hypertension: Secondary | ICD-10-CM

## 2014-05-01 DIAGNOSIS — I5032 Chronic diastolic (congestive) heart failure: Secondary | ICD-10-CM

## 2014-05-01 DIAGNOSIS — J069 Acute upper respiratory infection, unspecified: Secondary | ICD-10-CM

## 2014-05-01 NOTE — Patient Instructions (Signed)
It was nice seeing you today, glad to know you doing better since hospital discharge except for the cough and cold. Your lung exam was clear hence this is likely viral cough, please use OTC cough medication if needed otherwise this will resolve by itself. If no improvement please return soon. I have referred you to the nutritionist for weight loss,please call for appointment.

## 2014-05-01 NOTE — Assessment & Plan Note (Signed)
Improving symptoms. May use OTC cough syrup. Follow up soon if worsening.

## 2014-05-01 NOTE — Assessment & Plan Note (Addendum)
S/P hospital admission and discharge. Currently euvolemic with no acute symptoms. Continue lasix as instructed. Continue Lisinopril and metoprolol as well. F/U cardiologist as instructed.

## 2014-05-01 NOTE — Progress Notes (Signed)
Subjective:     Patient ID: [name], female   DOB: 1960-04-28, 54 y.o.   MRN: [id number]  HPI  CHF/CAD: Was recently admitted briefly for chest pain for which she was adequately assessed by the cardiologist, she also had stress test done at the time. Currently she feels better, denies any major concern, she is now on Lasix 40 mg daily trying to get back to her dry weight as instructed by her cardiologist. She has cardiology follow up as well. GGY:IRSWNIOEV with her medication, her for follow up. Cough:C/O cough which started since she was d/c from the hospital 6 days ago, she had fever 5 days ago but none since then. Initially she had a yellowish green sputum production but now clearing.No SOB. No chest pain. Obesity: Patient is trying to lose weigh, will like to be seen by a nutritionist.  Current Outpatient Prescriptions on File Prior to Visit  Medication Sig Dispense Refill  . aspirin 81 MG EC tablet Take 81 mg by mouth daily.      . furosemide (LASIX) 20 MG tablet Take 20-40 mg by mouth daily. Take two tablets if feeling fluid overloaded.  Otherwise just take one tablet daily.  12  . lisinopril (PRINIVIL,ZESTRIL) 20 MG tablet Take 1 tablet (20 mg total) by mouth daily. 30 tablet 0  . metoprolol succinate (TOPROL-XL) 25 MG 24 hr tablet TAKE 1 TABLET (25 MG TOTAL) BY MOUTH DAILY. (Patient taking differently: Take 25 mg by mouth daily. ) 30 tablet 4  . Multiple Vitamin (MULTIVITAMIN) tablet Take 1 tablet by mouth daily.      . norethindrone (AYGESTIN) 5 MG tablet Take 5 mg by mouth daily.    . potassium chloride SA (K-DUR,KLOR-CON) 20 MEQ tablet Take 1 tablet (20 mEq total) by mouth daily. 30 tablet 0   No current facility-administered medications on file prior to visit.   Past Medical History  Diagnosis Date  . Chest pain     cath 7/09: normal cors; EF 65%  . HTN (hypertension)   . Hyperlipidemia   . HOCM (hypertrophic obstructive cardiomyopathy)     echo 10/11: normal LVF;  apical hypertropy; mod LAE; mild MR;      Holter 10/11: no NSVT;     ETT 10/11: no decreased BP with exercise   . Diastolic congestive heart failure   . Anemia   . GERD (gastroesophageal reflux disease)      Review of Systems  Respiratory: Positive for cough. Negative for wheezing.   Cardiovascular: Negative.   Gastrointestinal: Negative.   Genitourinary: Negative.   All other systems reviewed and are negative.  Filed Vitals:   05/01/14 1000  BP: 119/74  Pulse: 73  Temp: 98.2 F (36.8 C)  TempSrc: Oral  Height: 5\' 5"  (1.651 m)  Weight: 203 lb (92.08 kg)       Objective:   Physical Exam  Constitutional: She is oriented to person, place, and time. She appears well-developed. No distress.  Cardiovascular: Normal rate, regular rhythm, normal heart sounds and intact distal pulses.   No murmur heard. Pulmonary/Chest: Effort normal and breath sounds normal. No respiratory distress. She has no wheezes.  Abdominal: Soft. Bowel sounds are normal. She exhibits no distension and no mass. There is no tenderness.  Musculoskeletal: Normal range of motion. She exhibits no edema.  Neurological: She is alert and oriented to person, place, and time.  Nursing note and vitals reviewed.      Assessment:     CHF HTN URI  Obesity     Plan:     Check problem list.

## 2014-05-01 NOTE — Assessment & Plan Note (Signed)
Stable  Continue current regimen  

## 2014-05-01 NOTE — Assessment & Plan Note (Signed)
Nutritionist referral done.

## 2014-05-06 ENCOUNTER — Other Ambulatory Visit: Payer: Self-pay | Admitting: *Deleted

## 2014-05-06 MED ORDER — METOPROLOL SUCCINATE ER 25 MG PO TB24: ORAL_TABLET | ORAL | Status: DC

## 2014-05-12 NOTE — Progress Notes (Signed)
HPI: Followup apical hypertrophic cardiomyopathy. She had a normal cardiac catheterization in 2009 other than apical hypertrophy. A Holter monitor showed no nonsustained ventricular tachycardia. An exercise treadmill revealed no decrease in systolic blood pressure with exercise. Echo repeated in July 2015. Ejection fraction 18-84%, grade 2 diastolic dysfunction, mild left atrial enlargement, mild mitral regurgitation, findings consistent with apical hypertrophic cardiomyopathy. Also with history of diastolic congestive heart failure. Nuclear study 12/15 showed EF 37, anterior ischemia, inferior attenuation/ischemia. Cath 12/15 showed normal coronary arteries, hyperdynamic function; apical hypertrophy. Since she was last seen, her dyspnea has improved. She has dyspnea with more extreme activities but not routine activities. No orthopnea, PND, pedal edema, syncope or chest pain.  Current Outpatient Prescriptions  Medication Sig Dispense Refill  . aspirin 81 MG EC tablet Take 81 mg by mouth daily.      . furosemide (LASIX) 20 MG tablet Take 20-40 mg by mouth daily. Take two tablets if feeling fluid overloaded.  Otherwise just take one tablet daily.  12  . lisinopril (PRINIVIL,ZESTRIL) 20 MG tablet Take 1 tablet (20 mg total) by mouth daily. 30 tablet 0  . metoprolol succinate (TOPROL-XL) 25 MG 24 hr tablet TAKE 1 TABLET (25 MG TOTAL) BY MOUTH DAILY. 30 tablet 0  . Multiple Vitamin (MULTIVITAMIN) tablet Take 1 tablet by mouth daily.      . norethindrone (AYGESTIN) 5 MG tablet Take 5 mg by mouth daily.    . potassium chloride SA (K-DUR,KLOR-CON) 20 MEQ tablet Take 1 tablet (20 mEq total) by mouth daily. 30 tablet 0   No current facility-administered medications for this visit.     Past Medical History  Diagnosis Date  . Chest pain     cath 7/09: normal cors; EF 65%  . HTN (hypertension)   . Hyperlipidemia   . HOCM (hypertrophic obstructive cardiomyopathy)     echo 10/11: normal LVF;  apical hypertropy; mod LAE; mild MR;      Holter 10/11: no NSVT;     ETT 10/11: no decreased BP with exercise   . Diastolic congestive heart failure   . Anemia   . GERD (gastroesophageal reflux disease)     Past Surgical History  Procedure Laterality Date  . Tubal ligation    . Cardiac catheterization    . Left heart catheterization with coronary/graft angiogram N/A 04/21/2014    Procedure: LEFT HEART CATHETERIZATION WITH Beatrix Fetters;  Surgeon: Troy Sine, MD;  Location[facility] LAB;  Service: Cardiovascular;  Laterality: N/A;    History   Social History  . Marital Status: Married    Spouse Name: N/A    Number of Children: 33  . Years of Education: N/A   Occupational History  . paralegal    Social History Main Topics  . Smoking status: Never Smoker   . Smokeless tobacco: Never Used  . Alcohol Use: No  . Drug Use: No  . Sexual Activity: Yes    Birth Control/ Protection: None   Other Topics Concern  . Not on file   Social History Narrative   Married (1st husband died from massive MI), has 5 children (+ 4 from husband's prior marriage), works as Herbalist; Exercises routinely at [employer].  Is planning on adopting 3 foster children (2012)          ROS: no fevers or chills, productive cough, hemoptysis, dysphasia, odynophagia, melena, hematochezia, dysuria, hematuria, rash, seizure activity, orthopnea, PND, pedal edema, claudication. Remaining systems are negative.  Physical  Exam: Well-developed well-nourished in no acute distress.  Skin is warm and dry.  HEENT is normal.  Neck is supple.  Chest is clear to auscultation with normal expansion.  Cardiovascular exam is regular rate and rhythm.  Abdominal exam nontender or distended. No masses palpated. Extremities show no edema. neuro grossly intact  ECG     This encounter was created in error - please disregard.

## 2014-05-15 ENCOUNTER — Encounter: Payer: 59 | Admitting: Cardiology

## 2014-05-17 ENCOUNTER — Other Ambulatory Visit: Payer: Self-pay | Admitting: Family Medicine

## 2014-06-08 ENCOUNTER — Other Ambulatory Visit: Payer: Self-pay | Admitting: Nurse Practitioner

## 2014-06-08 MED ORDER — METOPROLOL SUCCINATE ER 25 MG PO TB24
ORAL_TABLET | ORAL | Status: DC
Start: 2014-06-08 — End: 2015-03-29

## 2014-06-11 NOTE — Progress Notes (Signed)
HPI: Followup apical hypertrophic cardiomyopathy. A myoview in 2009 showed EF 44%, mild mid and apical anterior wall ischemia. She had a normal cardiac catheterization in 2009 other than apical hypertrophy. A Holter monitor showed no nonsustained ventricular tachycardia. An exercise treadmill revealed no decrease in systolic blood pressure with exercise. Echo repeated in July 2015. Ejection fraction 08-14%, grade 2 diastolic dysfunction, mild left atrial enlargement, mild mitral regurgitation, findings consistent with apical hypertrophic cardiomyopathy. Also with history of diastolic congestive heart failure. Admitted with chest pain in December 2015 and nuclear study abnormal; cardiac catheterization revealed apical variant hypertrophic cardiomyopathy and normal coronary arteries. Since she was last seen, she has mild dyspnea and chest tightness with vigorous activities but not routine activities. No orthopnea, PND, pedal edema or syncope.  Current Outpatient Prescriptions  Medication Sig Dispense Refill  . aspirin 81 MG EC tablet Take 81 mg by mouth daily.      . furosemide (LASIX) 20 MG tablet Take 20-40 mg by mouth daily. Take two tablets if feeling fluid overloaded.  Otherwise just take one tablet daily.  12  . KLOR-CON M20 20 MEQ tablet TAKE 1 TABLET (20 MEQ TOTAL) BY MOUTH DAILY. 30 tablet 2  . lisinopril (PRINIVIL,ZESTRIL) 20 MG tablet TAKE 1 TABLET (20 MG TOTAL) BY MOUTH DAILY. 30 tablet 2  . metoprolol succinate (TOPROL-XL) 25 MG 24 hr tablet TAKE 1 TABLET (25 MG TOTAL) BY MOUTH DAILY. 30 tablet 6  . Multiple Vitamin (MULTIVITAMIN) tablet Take 1 tablet by mouth daily.       No current facility-administered medications for this visit.     Past Medical History  Diagnosis Date  . Chest pain     cath 7/09: normal cors; EF 65%  . HTN (hypertension)   . Hyperlipidemia   . HOCM (hypertrophic obstructive cardiomyopathy)     echo 10/11: normal LVF; apical hypertropy; mod LAE; mild  MR;      Holter 10/11: no NSVT;     ETT 10/11: no decreased BP with exercise   . Diastolic congestive heart failure   . Anemia   . GERD (gastroesophageal reflux disease)     Past Surgical History  Procedure Laterality Date  . Tubal ligation    . Cardiac catheterization    . Left heart catheterization with coronary/graft angiogram N/A 04/21/2014    Procedure: LEFT HEART CATHETERIZATION WITH Beatrix Fetters;  Surgeon: Troy Sine, MD;  Location: [facility] CATH LAB;  Service: Cardiovascular;  Laterality: N/A;    History   Social History  . Marital Status: Married    Spouse Name: N/A  . Number of Children: 5  . Years of Education: N/A   Occupational History  . paralegal    Social History Main Topics  . Smoking status: Never Smoker   . Smokeless tobacco: Never Used  . Alcohol Use: No  . Drug Use: No  . Sexual Activity: Yes    Birth Control/ Protection: None   Other Topics Concern  . 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  Is planning on adopting 3 foster children (2012)          ROS: no fevers or chills, productive cough, hemoptysis, dysphasia, odynophagia, melena, hematochezia, dysuria, hematuria, rash, seizure activity, orthopnea, PND, pedal edema, claudication. Remaining systems are negative.  Physical Exam: Well-developed well-nourished in no  acute distress.  Skin is warm and dry.  HEENT is normal.  Neck is supple.  Chest is clear to auscultation with normal expansion.  Cardiovascular exam is regular rate and rhythm.  Abdominal exam nontender or distended. No masses palpated. Extremities show no edema. neuro grossly intact

## 2014-06-12 ENCOUNTER — Encounter: Payer: Self-pay | Admitting: Cardiology

## 2014-06-12 ENCOUNTER — Ambulatory Visit (INDEPENDENT_AMBULATORY_CARE_PROVIDER_SITE_OTHER): Payer: Medicare Other | Admitting: Cardiology

## 2014-06-12 DIAGNOSIS — I10 Essential (primary) hypertension: Secondary | ICD-10-CM

## 2014-06-12 DIAGNOSIS — I421 Obstructive hypertrophic cardiomyopathy: Secondary | ICD-10-CM

## 2014-06-12 DIAGNOSIS — I5032 Chronic diastolic (congestive) heart failure: Secondary | ICD-10-CM

## 2014-06-12 MED ORDER — METOPROLOL SUCCINATE ER 50 MG PO TB24: 50.0000 mg | ORAL_TABLET | Freq: Every day | ORAL | Status: DC

## 2014-06-12 NOTE — Patient Instructions (Signed)
Your physician wants you to follow-up in: [place] will receive a reminder letter in the mail two months in advance. If you don't receive a letter, please call our office to schedule the follow-up appointment.   INCREASE METOPROLOL TO 50 MG ONCE DAILY=2 OF THE 25 MG TABLETS ONCE DAILY

## 2014-06-12 NOTE — Assessment & Plan Note (Addendum)
Patient has apical variant hypertrophic cardiomyopathy. Continue toprol but increase to 50 mg daily. Continue present dose of Lasix. Take additional 20 mg daily as needed. She does not have risk factors for sudden death.

## 2014-06-12 NOTE — Assessment & Plan Note (Signed)
Continue present blood pressure medications. 

## 2014-06-12 NOTE — Assessment & Plan Note (Signed)
Patient continues with occasional chest tightness. Catheterization revealed no obstructive coronary disease.

## 2014-06-12 NOTE — Assessment & Plan Note (Addendum)
Continue present dose of Lasix. Patient will take an additional 40 mg daily for weight gain of 2-3 pounds. She is traveling to [place] and I've asked her to take additional Lasix as needed.

## 2014-08-08 ENCOUNTER — Other Ambulatory Visit: Payer: Self-pay | Admitting: Family Medicine

## 2014-09-16 ENCOUNTER — Other Ambulatory Visit: Payer: Self-pay

## 2014-09-16 MED ORDER — FUROSEMIDE 20 MG PO TABS: 20.0000 mg | ORAL_TABLET | Freq: Every day | ORAL | Status: DC

## 2014-09-16 NOTE — Telephone Encounter (Signed)
Per note 2.19.16

## 2014-11-19 ENCOUNTER — Other Ambulatory Visit: Payer: Self-pay | Admitting: Family Medicine

## 2015-01-20 NOTE — Progress Notes (Signed)
HPI: Followup apical hypertrophic cardiomyopathy. A previous Holter monitor showed no nonsustained ventricular tachycardia. An exercise treadmill revealed no decrease in systolic blood pressure with exercise. Echo repeated in July 2015. Ejection fraction 28-41%, grade 2 diastolic dysfunction, mild left atrial enlargement, mild mitral regurgitation, findings consistent with apical hypertrophic cardiomyopathy. Cardiac catheterization following abnormal nuclear study 12/15 revealed apical variant hypertrophic cardiomyopathy and normal coronary arteries. Also with history of diastolic congestive heart failure. Since she was last seen, she notes some dyspnea with climbing stairs but otherwise no orthopnea, PND or pedal edema. Occasional chest tightness with exertion. No syncope.  Current Outpatient Prescriptions  Medication Sig Dispense Refill  . aspirin 81 MG EC tablet Take 81 mg by mouth daily.      . furosemide (LASIX) 20 MG tablet Take 1-2 tablets (20-40 mg total) by mouth daily. Take two tablets if feeling fluid overloaded.  Otherwise just take one tablet daily. 30 tablet 12  . KLOR-CON M20 20 MEQ tablet TAKE 1 TABLET (20 MEQ TOTAL) BY MOUTH DAILY. 30 tablet 2  . lisinopril (PRINIVIL,ZESTRIL) 20 MG tablet TAKE 1 TABLET (20 MG TOTAL) BY MOUTH DAILY. 30 tablet 2  . metoprolol succinate (TOPROL-XL) 25 MG 24 hr tablet Take 1 tablet by mouth daily.  6  . Multiple Vitamin (MULTIVITAMIN) tablet Take 1 tablet by mouth daily.       No current facility-administered medications for this visit.     Past Medical History  Diagnosis Date  . Chest pain     cath 7/09: normal cors; EF 65%  . HTN (hypertension)   . Hyperlipidemia   . HOCM (hypertrophic obstructive cardiomyopathy)     echo 10/11: normal LVF; apical hypertropy; mod LAE; mild MR;      Holter 10/11: no NSVT;     ETT 10/11: no decreased BP with exercise   . Diastolic congestive heart failure   . Anemia   . GERD (gastroesophageal reflux  disease)     Past Surgical History  Procedure Laterality Date  . Tubal ligation    . Cardiac catheterization    . Left heart catheterization with coronary/graft angiogram N/A 04/21/2014    Procedure: LEFT HEART CATHETERIZATION WITH Beatrix Fetters;  Surgeon: Troy Sine, MD;  Location: [facility] CATH LAB;  Service: Cardiovascular;  Laterality: N/A;    Social History   Social History  . Marital Status: Married    Spouse Name: N/A  . Number of Children: 5  . Years of Education: N/A   Occupational History  . paralegal    Social History Main Topics  . Smoking status: Never Smoker   . Smokeless tobacco: Never Used  . Alcohol Use: No  . Drug Use: No  . Sexual Activity: Yes    Birth Control/ Protection: None   Other Topics Concern  . 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  Is planning on adopting 3 foster children (2012)          ROS: no fevers or chills, productive cough, hemoptysis, dysphasia, odynophagia, melena, hematochezia, dysuria, hematuria, rash, seizure activity, orthopnea, PND, pedal edema, claudication. Remaining systems are negative.  Physical Exam: Well-developed well-nourished in no acute distress.  Skin is warm and dry.  HEENT is normal.  Neck is supple.  Chest is clear to auscultation with normal expansion.  Cardiovascular exam is  regular rate and rhythm.  Abdominal exam nontender or distended. No masses palpated. Extremities show no edema. neuro grossly intact  ECG normal sinus rhythm at a rate of 63. Inferior lateral T-wave inversion. Left ventricular hypertrophy.

## 2015-01-22 ENCOUNTER — Encounter: Payer: Self-pay | Admitting: Cardiology

## 2015-01-22 ENCOUNTER — Ambulatory Visit (INDEPENDENT_AMBULATORY_CARE_PROVIDER_SITE_OTHER): Payer: Commercial Managed Care - HMO | Admitting: Cardiology

## 2015-01-22 ENCOUNTER — Encounter: Payer: Self-pay | Admitting: *Deleted

## 2015-01-22 VITALS — BP 138/70 | HR 63 | Ht 65.0 in | Wt 201.1 lb

## 2015-01-22 DIAGNOSIS — I421 Obstructive hypertrophic cardiomyopathy: Secondary | ICD-10-CM

## 2015-01-22 DIAGNOSIS — I5032 Chronic diastolic (congestive) heart failure: Secondary | ICD-10-CM

## 2015-01-22 LAB — BASIC METABOLIC PANEL
BUN: 20 mg/dL (ref 7–25)
CHLORIDE: 101 mmol/L (ref 98–110)
CO2: 27 mmol/L (ref 20–31)
Calcium: 9.6 mg/dL (ref 8.6–10.4)
Creat: 1.01 mg/dL (ref 0.50–1.05)
Glucose, Bld: 89 mg/dL (ref 65–99)
Potassium: 4.2 mmol/L (ref 3.5–5.3)
Sodium: 136 mmol/L (ref 135–146)

## 2015-01-22 NOTE — Patient Instructions (Addendum)
Your physician wants you to follow-up in: [place] will receive a reminder letter in the mail two months in advance. If you don't receive a letter, please call our office to schedule the follow-up appointment.   Your physician has requested that you have an echocardiogram. Echocardiography is a painless test that uses sound waves to create images of your heart. It provides your doctor with information about the size and shape of your heart and how well your heart's chambers and valves are working. This procedure takes approximately one hour. There are no restrictions for this procedure.   Your physician has recommended that you wear a 24 HOUR holter monitor. Holter monitors are medical devices that record the heart's electrical activity. Doctors most often use these monitors to diagnose arrhythmias. Arrhythmias are problems with the speed or rhythm of the heartbeat. The monitor is a small, portable device. You can wear one while you do your normal daily activities. This is usually used to diagnose what is causing palpitations/syncope (passing out).   Your physician recommends that you HAVE LAB WORK TODAY

## 2015-01-22 NOTE — Assessment & Plan Note (Signed)
Blood pressure controlled. Continue present medications. 

## 2015-01-22 NOTE — Assessment & Plan Note (Addendum)
Patient has apical variant hypertrophic cardiomyopathy. Continue toprol but increased to 100 mg daily. Continue present dose of Lasix. Take additional 20 mg daily as needed. Plan repeat echocardiogram and Holter monitor to assess for nonsustained ventricular tachycardia. She does not have risk factors for sudden death.

## 2015-01-22 NOTE — Assessment & Plan Note (Signed)
Plan to continue present dose of Lasix. Check potassium and renal function. She should continue on low sodium diet.

## 2015-01-22 NOTE — Assessment & Plan Note (Signed)
She has had problems with recurrent chest pain previously. Cardiac catheterization December 2015 showed normal coronary arteries.

## 2015-02-03 ENCOUNTER — Other Ambulatory Visit: Payer: Self-pay | Admitting: Cardiology

## 2015-02-03 DIAGNOSIS — I493 Ventricular premature depolarization: Secondary | ICD-10-CM

## 2015-02-03 DIAGNOSIS — I421 Obstructive hypertrophic cardiomyopathy: Secondary | ICD-10-CM

## 2015-02-04 ENCOUNTER — Ambulatory Visit: Payer: Commercial Managed Care - HMO

## 2015-02-04 ENCOUNTER — Ambulatory Visit (INDEPENDENT_AMBULATORY_CARE_PROVIDER_SITE_OTHER): Payer: Commercial Managed Care - HMO

## 2015-02-04 ENCOUNTER — Other Ambulatory Visit: Payer: Self-pay

## 2015-02-04 ENCOUNTER — Ambulatory Visit (HOSPITAL_COMMUNITY): Payer: Commercial Managed Care - HMO | Attending: Cardiology

## 2015-02-04 DIAGNOSIS — I517 Cardiomegaly: Secondary | ICD-10-CM | POA: Insufficient documentation

## 2015-02-04 DIAGNOSIS — I5189 Other ill-defined heart diseases: Secondary | ICD-10-CM | POA: Diagnosis not present

## 2015-02-04 DIAGNOSIS — I340 Nonrheumatic mitral (valve) insufficiency: Secondary | ICD-10-CM | POA: Insufficient documentation

## 2015-02-04 DIAGNOSIS — I272 Other secondary pulmonary hypertension: Secondary | ICD-10-CM | POA: Diagnosis not present

## 2015-02-04 DIAGNOSIS — I429 Cardiomyopathy, unspecified: Secondary | ICD-10-CM | POA: Diagnosis present

## 2015-02-04 DIAGNOSIS — I10 Essential (primary) hypertension: Secondary | ICD-10-CM | POA: Diagnosis not present

## 2015-02-04 DIAGNOSIS — I493 Ventricular premature depolarization: Secondary | ICD-10-CM

## 2015-02-04 DIAGNOSIS — I421 Obstructive hypertrophic cardiomyopathy: Secondary | ICD-10-CM | POA: Diagnosis not present

## 2015-02-22 ENCOUNTER — Other Ambulatory Visit: Payer: Self-pay | Admitting: Family Medicine

## 2015-03-29 ENCOUNTER — Other Ambulatory Visit: Payer: Self-pay | Admitting: Nurse Practitioner

## 2015-05-11 ENCOUNTER — Telehealth: Payer: Self-pay | Admitting: *Deleted

## 2015-05-11 NOTE — Telephone Encounter (Signed)
Called patient to offer flu vaccine. Scheduled patient to receive flu vaccine on 05/21/2015 at 0900. Velora Heckler, RN

## 2015-05-21 ENCOUNTER — Ambulatory Visit: Payer: Commercial Managed Care - HMO

## 2015-05-22 ENCOUNTER — Other Ambulatory Visit: Payer: Self-pay | Admitting: Family Medicine

## 2015-05-24 NOTE — Telephone Encounter (Signed)
On Lasix. Need lab for next refill to check for potassium.

## 2015-06-15 ENCOUNTER — Telehealth: Payer: Self-pay | Admitting: *Deleted

## 2015-06-15 NOTE — Telephone Encounter (Signed)
Called patient to offer flu vaccine, however, VM picked up.  Left message on patient's voice mail to return call. Aideen Fenster L, RN   

## 2015-06-30 ENCOUNTER — Other Ambulatory Visit: Payer: Self-pay | Admitting: *Deleted

## 2015-06-30 MED ORDER — FUROSEMIDE 20 MG PO TABS: 20.0000 mg | ORAL_TABLET | Freq: Every day | ORAL | Status: DC

## 2015-08-09 ENCOUNTER — Other Ambulatory Visit: Payer: Self-pay

## 2015-08-09 MED ORDER — FUROSEMIDE 20 MG PO TABS: 20.0000 mg | ORAL_TABLET | Freq: Every day | ORAL | Status: DC

## 2015-09-06 ENCOUNTER — Other Ambulatory Visit: Payer: Self-pay | Admitting: *Deleted

## 2015-09-06 MED ORDER — FUROSEMIDE 20 MG PO TABS: 20.0000 mg | ORAL_TABLET | Freq: Every day | ORAL | Status: DC

## 2015-09-22 ENCOUNTER — Other Ambulatory Visit: Payer: Self-pay | Admitting: *Deleted

## 2015-09-22 MED ORDER — FUROSEMIDE 20 MG PO TABS: 20.0000 mg | ORAL_TABLET | Freq: Every day | ORAL | Status: DC

## 2015-09-22 NOTE — Telephone Encounter (Signed)
Rx(s) sent to pharmacy electronically. Patient scheduled for MD OV 12/2015

## 2015-10-02 ENCOUNTER — Other Ambulatory Visit: Payer: Self-pay | Admitting: Family Medicine

## 2015-10-04 ENCOUNTER — Encounter: Payer: Self-pay | Admitting: Family Medicine

## 2015-10-04 ENCOUNTER — Ambulatory Visit (INDEPENDENT_AMBULATORY_CARE_PROVIDER_SITE_OTHER): Payer: Commercial Managed Care - HMO | Admitting: Family Medicine

## 2015-10-04 VITALS — BP 127/94 | HR 71 | Temp 98.0°F | Ht 65.0 in | Wt 204.8 lb

## 2015-10-04 DIAGNOSIS — M79605 Pain in left leg: Secondary | ICD-10-CM | POA: Diagnosis not present

## 2015-10-04 MED ORDER — CYCLOBENZAPRINE HCL 5 MG PO TABS: 5.0000 mg | ORAL_TABLET | Freq: Three times a day (TID) | ORAL | Status: DC | PRN

## 2015-10-04 MED ORDER — GABAPENTIN 100 MG PO CAPS: 100.0000 mg | ORAL_CAPSULE | Freq: Three times a day (TID) | ORAL | Status: DC

## 2015-10-04 NOTE — Progress Notes (Signed)
Date of Visit: 10/04/2015   HPI:  Patient presents to discuss pain in left leg. Started with low back pain about 3 weeks ago and is now localized to her left thigh along the top and side. Pain is sharp but not burning. It's nagging. Leg seemed to give out once. No preceding injuries. Denies having fever, saddle anesthesia, lower extremity weakness, or problems with stooling or urination. Has been taking lots of motrin for the pain. Pain is constant.  ROS: See HPI.  [place]: history of anxiety, syncope, menorrhagia, CHF, hypertension, vit D deficiency  PHYSICAL EXAM: BP 127/94 mmHg  Pulse 71  Temp(Src) 98 F (36.7 C) (Oral)  Ht 5\' 5"  (1.651 m)  Wt 204 lb 12.8 oz (92.897 kg)  BMI 34.08 kg/m2 Gen: NAD, pleasant, cooperative HEENT: normocephalic, atraumatic Neuro: full strength bilateral lower extremities  Ext: mild tenderness to palpation of left thigh. Sensation intact over bilateral lower extremities. No significant tendermess to palpation of back.  ASSESSMENT/PLAN:  1. Left leg pain - seems radicular, likely due to muscle spasm. Will do trial of flexeril and gabapentin. Cautioned on sedating nature of these medications. Recommend she takes tylenol rather than ibuprofen due to her cardiac history. Given handout on exercises for sciatica. Follow up with PCP in 3-4 weeks, sooner if not improving or worsening.  FOLLOW UP: Follow up in 3-4 weeks with PCP for leg pain.  [employer]. Ardelia Mems, [place]

## 2015-10-04 NOTE — Patient Instructions (Signed)
For the pain in your leg: - gabapentin - nerve pain medicine - flexeril - muscle relaxer  Use caution as both of these can make you sleepy  Schedule an appointment with Dr. Gwendlyn Deutscher to follow up in 3-4 weeks [place] if not improving or if worsening  See handout below with exercises  Use tylenol rather than ibuprofen  Be well, Dr. Ardelia Mems    Sciatica With Rehab The sciatic nerve runs from the back down the leg and is responsible for sensation and control of the muscles in the back (posterior) side of the thigh, lower leg, and foot. Sciatica is a condition that is characterized by inflammation of this nerve.  SYMPTOMS   Signs of nerve damage, including numbness and/or weakness along the posterior side of the lower extremity.  Pain in the back of the thigh that may also travel down the leg.  Pain that worsens when sitting for long periods of time.  Occasionally, pain in the back or buttock. CAUSES  Inflammation of the sciatic nerve is the cause of sciatica. The inflammation is due to something irritating the nerve. Common sources of irritation include:  Sitting for long periods of time.  Direct trauma to the nerve.  Arthritis of the spine.  Herniated or ruptured disk.  Slipping of the vertebrae (spondylolisthesis).  Pressure from soft tissues, such as muscles or ligament-like tissue (fascia). RISK INCREASES WITH:  Sports that place pressure or stress on the spine (football or weightlifting).  Poor strength and flexibility.  Failure to warm up properly before activity.  Family history of low back pain or disk disorders.  Previous back injury or surgery.  Poor body mechanics, especially when lifting, or poor posture. PREVENTION   Warm up and stretch properly before activity.  Maintain physical fitness:  Strength, flexibility, and endurance.  Cardiovascular fitness.  Learn and use proper technique, especially with posture and lifting. When possible, have coach  correct improper technique.  Avoid activities that place stress on the spine. PROGNOSIS If treated properly, then sciatica usually resolves within 6 weeks. However, occasionally surgery is necessary.  RELATED COMPLICATIONS   Permanent nerve damage, including pain, numbness, tingle, or weakness.  Chronic back pain.  Risks of surgery: infection, bleeding, nerve damage, or damage to surrounding tissues. TREATMENT Treatment initially involves resting from any activities that aggravate your symptoms. The use of ice and medication may help reduce pain and inflammation. The use of strengthening and stretching exercises may help reduce pain with activity. These exercises may be performed at home or with referral to a therapist. A therapist may recommend further treatments, such as transcutaneous electronic nerve stimulation (TENS) or ultrasound. Your caregiver may recommend corticosteroid injections to help reduce inflammation of the sciatic nerve. If symptoms persist despite non-surgical (conservative) treatment, then surgery may be recommended. MEDICATION  If pain medication is necessary, then nonsteroidal anti-inflammatory medications, such as aspirin and ibuprofen, or other minor pain relievers, such as acetaminophen, are often recommended.  Do not take pain medication for 7 days before surgery.  Prescription pain relievers may be given if deemed necessary by your caregiver. Use only as directed and only as much as you need.  Ointments applied to the skin may be helpful.  Corticosteroid injections may be given by your caregiver. These injections should be reserved for the most serious cases, because they may only be given a certain number of times. HEAT AND COLD  Cold treatment (icing) relieves pain and reduces inflammation. Cold treatment should be applied for 10 to  15 minutes every 2 to 3 hours for inflammation and pain and immediately after any activity that aggravates your symptoms. Use  ice packs or massage the area with a piece of ice (ice massage).  Heat treatment may be used prior to performing the stretching and strengthening activities prescribed by your caregiver, physical therapist, or athletic trainer. Use a heat pack or soak the injury in warm water. SEEK MEDICAL CARE IF:  Treatment seems to offer no benefit, or the condition worsens.  Any medications produce adverse side effects. EXERCISES  RANGE OF MOTION (ROM) AND STRETCHING EXERCISES - Sciatica Most people with sciatic will find that their symptoms worsen with either excessive bending forward (flexion) or arching at the low back (extension). The exercises which will help resolve your symptoms will focus on the opposite motion. Your physician, physical therapist or athletic trainer will help you determine which exercises will be most helpful to resolve your low back pain. Do not complete any exercises without first consulting with your clinician. Discontinue any exercises which worsen your symptoms until you speak to your clinician. If you have pain, numbness or tingling which travels down into your buttocks, leg or foot, the goal of the therapy is for these symptoms to move closer to your back and eventually resolve. Occasionally, these leg symptoms will get better, but your low back pain may worsen; this is typically an indication of progress in your rehabilitation. Be certain to be very alert to any changes in your symptoms and the activities in which you participated in the 24 hours prior to the change. Sharing this information with your clinician will allow him/her to most efficiently treat your condition. These exercises may help you when beginning to rehabilitate your injury. Your symptoms may resolve with or without further involvement from your physician, physical therapist or athletic trainer. While completing these exercises, remember:   Restoring tissue flexibility helps normal motion to return to the joints.  This allows healthier, less painful movement and activity.  An effective stretch should be held for at least 30 seconds.  A stretch should never be painful. You should only feel a gentle lengthening or release in the stretched tissue. FLEXION RANGE OF MOTION AND STRETCHING EXERCISES: STRETCH - Flexion, Single Knee to Chest   Lie on a firm bed or floor with both legs extended in front of you.  Keeping one leg in contact with the floor, bring your opposite knee to your chest. Hold your leg in place by either grabbing behind your thigh or at your knee.  Pull until you feel a gentle stretch in your low back. Hold __________ seconds.  Slowly release your grasp and repeat the exercise with the opposite side. Repeat __________ times. Complete this exercise __________ times per day.  STRETCH - Flexion, Double Knee to Chest  Lie on a firm bed or floor with both legs extended in front of you.  Keeping one leg in contact with the floor, bring your opposite knee to your chest.  Tense your stomach muscles to support your back and then lift your other knee to your chest. Hold your legs in place by either grabbing behind your thighs or at your knees.  Pull both knees toward your chest until you feel a gentle stretch in your low back. Hold __________ seconds.  Tense your stomach muscles and slowly return one leg at a time to the floor. Repeat __________ times. Complete this exercise __________ times per day.  STRETCH - Low Trunk Rotation  Lie on a firm bed or floor. Keeping your legs in front of you, bend your knees so they are both pointed toward the ceiling and your feet are flat on the floor.  Extend your arms out to the side. This will stabilize your upper body by keeping your shoulders in contact with the floor.  Gently and slowly drop both knees together to one side until you feel a gentle stretch in your low back. Hold for __________ seconds.  Tense your stomach muscles to support your  low back as you bring your knees back to the starting position. Repeat the exercise to the other side. Repeat __________ times. Complete this exercise __________ times per day  EXTENSION RANGE OF MOTION AND FLEXIBILITY EXERCISES: STRETCH - Extension, Prone on Elbows  Lie on your stomach on the floor, a bed will be too soft. Place your palms about shoulder width apart and at the height of your head.  Place your elbows under your shoulders. If this is too painful, stack pillows under your chest.  Allow your body to relax so that your hips drop lower and make contact more completely with the floor.  Hold this position for __________ seconds.  Slowly return to lying flat on the floor. Repeat __________ times. Complete this exercise __________ times per day.  RANGE OF MOTION - Extension, Prone Press Ups  Lie on your stomach on the floor, a bed will be too soft. Place your palms about shoulder width apart and at the height of your head.  Keeping your back as relaxed as possible, slowly straighten your elbows while keeping your hips on the floor. You may adjust the placement of your hands to maximize your comfort. As you gain motion, your hands will come more underneath your shoulders.  Hold this position __________ seconds.  Slowly return to lying flat on the floor. Repeat __________ times. Complete this exercise __________ times per day.  STRENGTHENING EXERCISES - Sciatica  These exercises may help you when beginning to rehabilitate your injury. These exercises should be done near your "sweet spot." This is the neutral, low-back arch, somewhere between fully rounded and fully arched, that is your least painful position. When performed in this safe range of motion, these exercises can be used for people who have either a flexion or extension based injury. These exercises may resolve your symptoms with or without further involvement from your physician, physical therapist or athletic trainer.  While completing these exercises, remember:   Muscles can gain both the endurance and the strength needed for everyday activities through controlled exercises.  Complete these exercises as instructed by your physician, physical therapist or athletic trainer. Progress with the resistance and repetition exercises only as your caregiver advises.  You may experience muscle soreness or fatigue, but the pain or discomfort you are trying to eliminate should never worsen during these exercises. If this pain does worsen, stop and make certain you are following the directions exactly. If the pain is still present after adjustments, discontinue the exercise until you can discuss the trouble with your clinician. STRENGTHENING - Deep Abdominals, Pelvic Tilt   Lie on a firm bed or floor. Keeping your legs in front of you, bend your knees so they are both pointed toward the ceiling and your feet are flat on the floor.  Tense your lower abdominal muscles to press your low back into the floor. This motion will rotate your pelvis so that your tail bone is scooping upwards rather than pointing at your  feet or into the floor.  With a gentle tension and even breathing, hold this position for __________ seconds. Repeat __________ times. Complete this exercise __________ times per day.  STRENGTHENING - Abdominals, Crunches   Lie on a firm bed or floor. Keeping your legs in front of you, bend your knees so they are both pointed toward the ceiling and your feet are flat on the floor. Cross your arms over your chest.  Slightly tip your chin down without bending your neck.  Tense your abdominals and slowly lift your trunk high enough to just clear your shoulder blades. Lifting higher can put excessive stress on the low back and does not further strengthen your abdominal muscles.  Control your return to the starting position. Repeat __________ times. Complete this exercise __________ times per day.  STRENGTHENING -  Quadruped, Opposite UE/LE Lift  Assume a hands and knees position on a firm surface. Keep your hands under your shoulders and your knees under your hips. You may place padding under your knees for comfort.  Find your neutral spine and gently tense your abdominal muscles so that you can maintain this position. Your shoulders and hips should form a rectangle that is parallel with the floor and is not twisted.  Keeping your trunk steady, lift your right hand no higher than your shoulder and then your left leg no higher than your hip. Make sure you are not holding your breath. Hold this position __________ seconds.  Continuing to keep your abdominal muscles tense and your back steady, slowly return to your starting position. Repeat with the opposite arm and leg. Repeat __________ times. Complete this exercise __________ times per day.  STRENGTHENING - Abdominals and Quadriceps, Straight Leg Raise   Lie on a firm bed or floor with both legs extended in front of you.  Keeping one leg in contact with the floor, bend the other knee so that your foot can rest flat on the floor.  Find your neutral spine, and tense your abdominal muscles to maintain your spinal position throughout the exercise.  Slowly lift your straight leg off the floor about 6 inches for a count of 15, making sure to not hold your breath.  Still keeping your neutral spine, slowly lower your leg all the way to the floor. Repeat this exercise with each leg __________ times. Complete this exercise __________ times per day. POSTURE AND BODY MECHANICS CONSIDERATIONS - Sciatica Keeping correct posture when sitting, standing or completing your activities will reduce the stress put on different body tissues, allowing injured tissues a chance to heal and limiting painful experiences. The following are general guidelines for improved posture. Your physician or physical therapist will provide you with any instructions specific to your needs.  While reading these guidelines, remember:  The exercises prescribed by your provider will help you have the flexibility and strength to maintain correct postures.  The correct posture provides the optimal environment for your joints to work. All of your joints have less wear and tear when properly supported by a spine with good posture. This means you will experience a healthier, less painful body.  Correct posture must be practiced with all of your activities, especially prolonged sitting and standing. Correct posture is as important when doing repetitive low-stress activities (typing) as it is when doing a single heavy-load activity (lifting). RESTING POSITIONS Consider which positions are most painful for you when choosing a resting position. If you have pain with flexion-based activities (sitting, bending, stooping, squatting), choose a position  that allows you to rest in a less flexed posture. You would want to avoid curling into a fetal position on your side. If your pain worsens with extension-based activities (prolonged standing, working overhead), avoid resting in an extended position such as sleeping on your stomach. Most people will find more comfort when they rest with their spine in a more neutral position, neither too rounded nor too arched. Lying on a non-sagging bed on your side with a pillow between your knees, or on your back with a pillow under your knees will often provide some relief. Keep in mind, being in any one position for a prolonged period of time, no matter how correct your posture, can still lead to stiffness. PROPER SITTING POSTURE In order to minimize stress and discomfort on your spine, you must sit with correct posture Sitting with good posture should be effortless for a healthy body. Returning to good posture is a gradual process. Many people can work toward this most comfortably by using various supports until they have the flexibility and strength to maintain this  posture on their own. When sitting with proper posture, your ears will fall over your shoulders and your shoulders will fall over your hips. You should use the back of the chair to support your upper back. Your low back will be in a neutral position, just slightly arched. You may place a small pillow or folded towel at the base of your low back for support.  When working at a desk, create an environment that supports good, upright posture. Without extra support, muscles fatigue and lead to excessive strain on joints and other tissues. Keep these recommendations in mind: CHAIR:   A chair should be able to slide under your desk when your back makes contact with the back of the chair. This allows you to work closely.  The chair's height should allow your eyes to be level with the upper part of your monitor and your hands to be slightly lower than your elbows. BODY POSITION  Your feet should make contact with the floor. If this is not possible, use a foot rest.  Keep your ears over your shoulders. This will reduce stress on your neck and low back. INCORRECT SITTING POSTURES   If you are feeling tired and unable to assume a healthy sitting posture, do not slouch or slump. This puts excessive strain on your back tissues, causing more damage and pain. Healthier options include:  Using more support, like a lumbar pillow.  Switching tasks to something that requires you to be upright or walking.  Talking a brief walk.  Lying down to rest in a neutral-spine position. PROLONGED STANDING WHILE SLIGHTLY LEANING FORWARD  When completing a task that requires you to lean forward while standing in one place for a long time, place either foot up on a stationary 2-4 inch high object to help maintain the best posture. When both feet are on the ground, the low back tends to lose its slight inward curve. If this curve flattens (or becomes too large), then the back and your other joints will experience too much  stress, fatigue more quickly and can cause pain.  CORRECT STANDING POSTURES Proper standing posture should be assumed with all daily activities, even if they only take a few moments, like when brushing your teeth. As in sitting, your ears should fall over your shoulders and your shoulders should fall over your hips. You should keep a slight tension in your abdominal muscles to brace your  spine. Your tailbone should point down to the ground, not behind your body, resulting in an over-extended swayback posture.  INCORRECT STANDING POSTURES  Common incorrect standing postures include a forward head, locked knees and/or an excessive swayback. WALKING Walk with an upright posture. Your ears, shoulders and hips should all line-up. PROLONGED ACTIVITY IN A FLEXED POSITION When completing a task that requires you to bend forward at your waist or lean over a low surface, try to find a way to stabilize 3 of 4 of your limbs. You can place a hand or elbow on your thigh or rest a knee on the surface you are reaching across. This will provide you more stability so that your muscles do not fatigue as quickly. By keeping your knees relaxed, or slightly bent, you will also reduce stress across your low back. CORRECT LIFTING TECHNIQUES DO :   Assume a wide stance. This will provide you more stability and the opportunity to get as close as possible to the object which you are lifting.  Tense your abdominals to brace your spine; then bend at the knees and hips. Keeping your back locked in a neutral-spine position, lift using your leg muscles. Lift with your legs, keeping your back straight.  Test the weight of unknown objects before attempting to lift them.  Try to keep your elbows locked down at your sides in order get the best strength from your shoulders when carrying an object.  Always ask for help when lifting heavy or awkward objects. INCORRECT LIFTING TECHNIQUES DO NOT:   Lock your knees when lifting, even  if it is a small object.  Bend and twist. Pivot at your feet or move your feet when needing to change directions.  Assume that you cannot safely pick up a paperclip without proper posture.   This information is not intended to replace advice given to you by your health care provider. Make sure you discuss any questions you have with your health care provider.   Document Released: 04/10/2005 Document Revised: 08/25/2014 Document Reviewed: 07/23/2008 Elsevier Interactive Patient Education [employer].

## 2015-10-16 ENCOUNTER — Other Ambulatory Visit: Payer: Self-pay | Admitting: Family Medicine

## 2015-11-16 ENCOUNTER — Encounter: Payer: Self-pay | Admitting: Cardiology

## 2015-11-16 ENCOUNTER — Telehealth: Payer: Self-pay | Admitting: Cardiology

## 2015-11-22 NOTE — Telephone Encounter (Signed)
Closed encounter °

## 2015-12-06 ENCOUNTER — Other Ambulatory Visit: Payer: Self-pay | Admitting: Cardiology

## 2015-12-24 ENCOUNTER — Ambulatory Visit: Payer: Commercial Managed Care - HMO | Admitting: Cardiology

## 2016-01-12 ENCOUNTER — Ambulatory Visit: Payer: Commercial Managed Care - HMO | Admitting: Cardiology

## 2016-01-15 ENCOUNTER — Other Ambulatory Visit: Payer: Self-pay | Admitting: Family Medicine

## 2016-02-06 IMAGING — CR DG FOOT COMPLETE 3+V*L*
3 series · 3 of 3 positions shown · non-contrast
Comparison: None.

CLINICAL DATA: Left foot pain.

EXAM:
LEFT FOOT - COMPLETE 3+ VIEW

[foot ap]
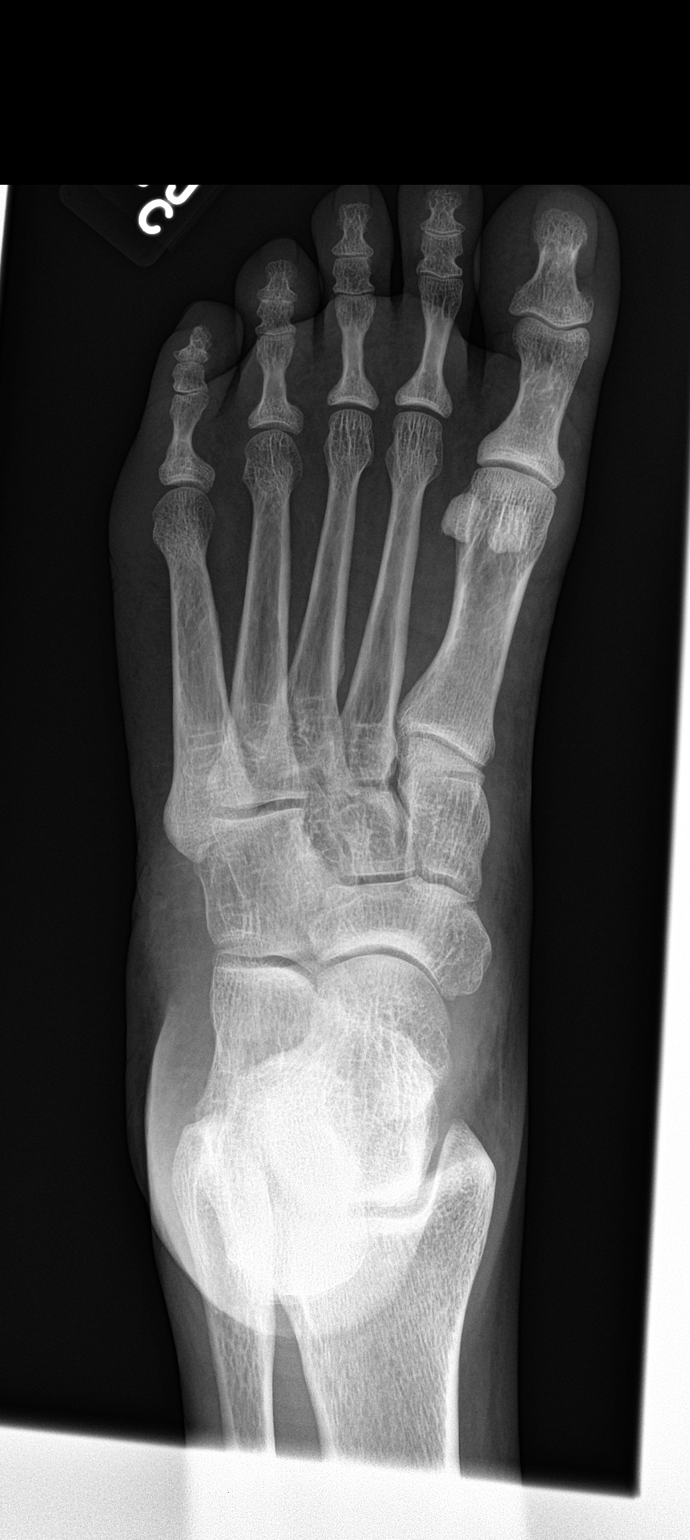

[foot obl]
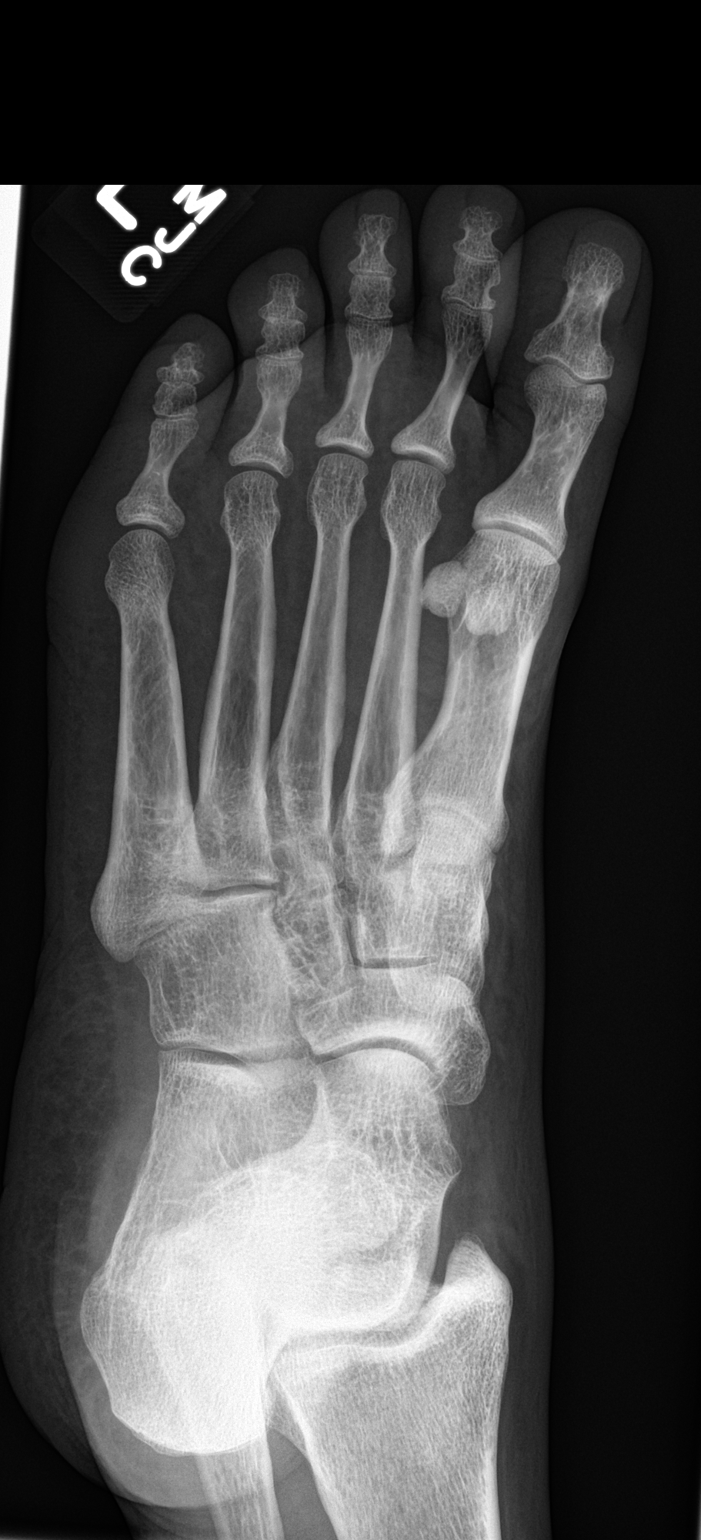

[foot lat]
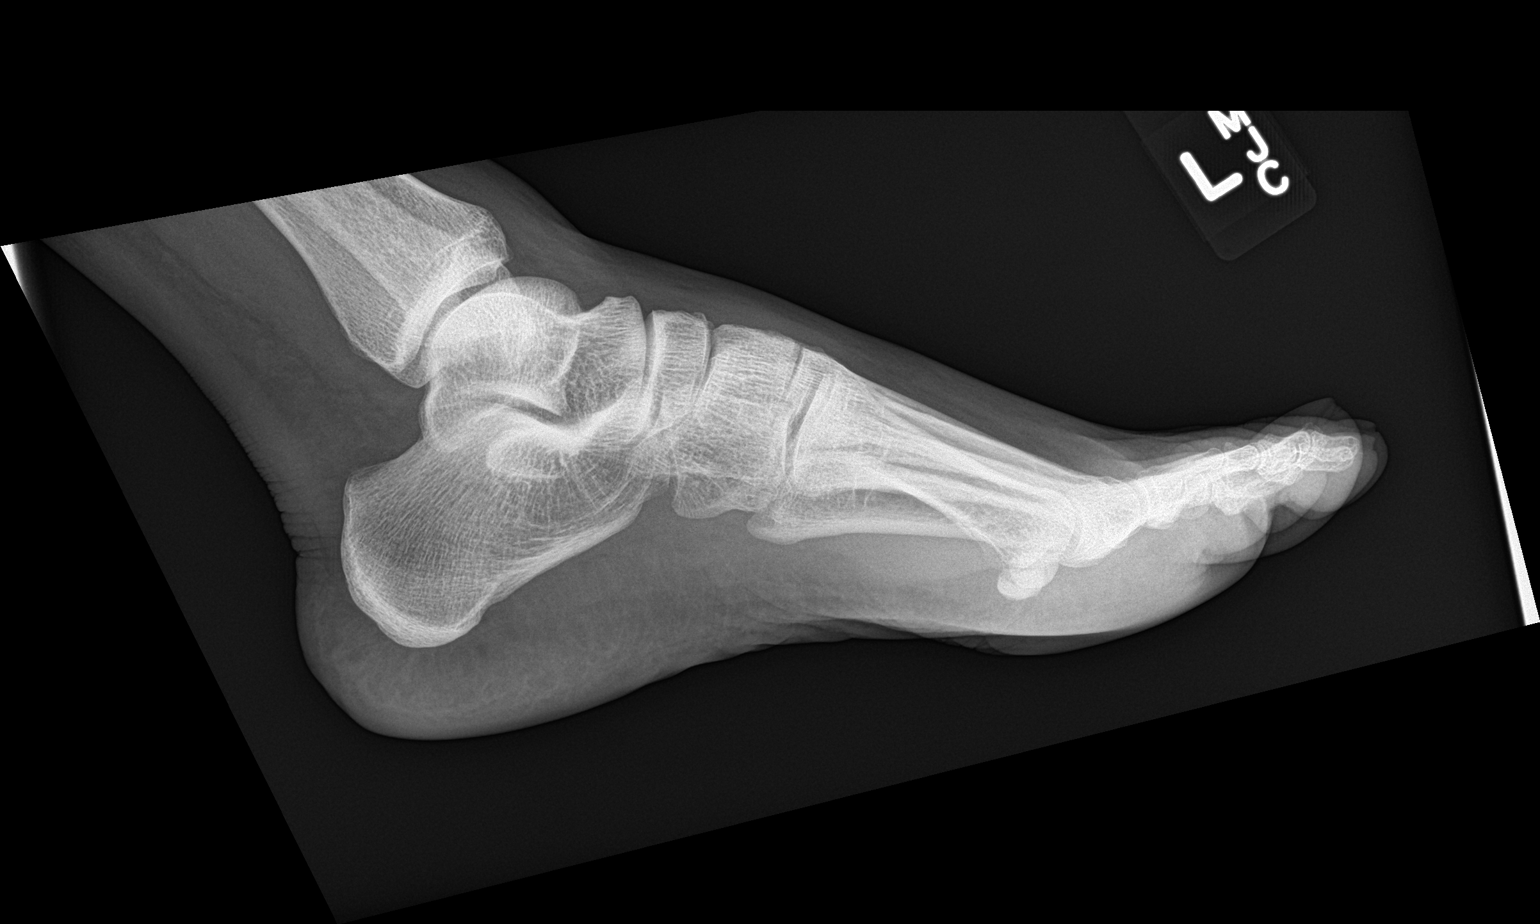

[3 of 3 positions shown; findings below may reference images not displayed]

FINDINGS: There is no evidence of fracture or dislocation. There is no
evidence of arthropathy or other focal bone abnormality. Soft
tissues are unremarkable.
IMPRESSION: Negative.

## 2016-02-11 ENCOUNTER — Other Ambulatory Visit: Payer: Self-pay | Admitting: Cardiology

## 2016-02-11 NOTE — Telephone Encounter (Signed)
Rx(s) sent to pharmacy electronically.  

## 2016-03-06 NOTE — Progress Notes (Deleted)
HPI: Followup apical hypertrophic cardiomyopathy. An exercise treadmill revealed no decrease in systolic blood pressure with exercise. Cardiac catheterization following abnormal nuclear study 12/15 revealed apical variant hypertrophic cardiomyopathy and normal coronary arteries. Also with history of diastolic congestive heart failure. Holter monitor October 2016 showed sinus with PACs and PVCs. Echocardiogram October 2016 showed vigorous LV systolic function, grade 2 diastolic dysfunction, mild mitral regurgitation, mild left atrial enlargement and findings consistent with apical hypertrophic cardiomyopathy. Since she was last seen,   Current Outpatient Prescriptions  Medication Sig Dispense Refill  . aspirin 81 MG EC tablet Take 81 mg by mouth daily.      . cyclobenzaprine (FLEXERIL) 5 MG tablet Take 1 tablet (5 mg total) by mouth 3 (three) times daily as needed for muscle spasms. 30 tablet 0  . furosemide (LASIX) 20 MG tablet TAKE 1 TO 2 TABLETS BY MOUTH DAILY 45 tablet 2  . gabapentin (NEURONTIN) 100 MG capsule Take 1 capsule (100 mg total) by mouth 3 (three) times daily. 45 capsule 0  . KLOR-CON M20 20 MEQ tablet TAKE 1 TABLET (20 MEQ TOTAL) BY MOUTH DAILY. 30 tablet 2  . lisinopril (PRINIVIL,ZESTRIL) 20 MG tablet TAKE 1 TABLET (20 MG TOTAL) BY MOUTH DAILY. 30 tablet 2  . metoprolol succinate (TOPROL-XL) 25 MG 24 hr tablet Take 1 tablet by mouth daily.  6  . metoprolol succinate (TOPROL-XL) 25 MG 24 hr tablet TAKE 1 TABLET (25 MG TOTAL) BY MOUTH DAILY. 30 tablet 11  . Multiple Vitamin (MULTIVITAMIN) tablet Take 1 tablet by mouth daily.       No current facility-administered medications for this visit.      Past Medical History:  Diagnosis Date  . Anemia   . Chest pain    cath 7/09: normal cors; EF 65%  . Diastolic congestive heart failure ([place])   . GERD (gastroesophageal reflux disease)   . HOCM (hypertrophic obstructive cardiomyopathy) ([place])    echo 10/11: normal LVF; apical  hypertropy; mod LAE; mild MR;      Holter 10/11: no NSVT;     ETT 10/11: no decreased BP with exercise   . HTN (hypertension)   . Hyperlipidemia     Past Surgical History:  Procedure Laterality Date  . CARDIAC CATHETERIZATION    . LEFT HEART CATHETERIZATION WITH CORONARY/GRAFT ANGIOGRAM N/A 04/21/2014   Procedure: LEFT HEART CATHETERIZATION WITH Beatrix Fetters;  Surgeon: Troy Sine, MD;  Location: [facility] CATH LAB;  Service: Cardiovascular;  Laterality: N/A;  . TUBAL LIGATION      Social History   Social History  . Marital status: Married    Spouse name: N/A  . Number of children: 5  . 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 Smoking status: Never Smoker  . Smokeless tobacco: Never Used  . Alcohol use No  . Drug use: No  . Sexual activity: Yes    Birth control/ protection: None   Other Topics Concern  . 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.  Is planning on adopting 3 foster children (2012)          Family History  Problem Relation Age of Onset  . Alcohol abuse Father   . Cirrhosis Father   . Asthma Brother   .  Cervical cancer Maternal Grandmother     ROS: no fevers or chills, productive cough, hemoptysis, dysphasia, odynophagia, melena, hematochezia, dysuria, hematuria, rash, seizure activity, orthopnea, PND, pedal edema, claudication. Remaining systems are negative.  Physical Exam: Well-developed well-nourished in no acute distress.  Skin is warm and dry.  HEENT is normal.  Neck is supple.  Chest is clear to auscultation with normal expansion.  Cardiovascular exam is regular rate and rhythm.  Abdominal exam nontender or distended. No masses palpated. Extremities show no edema. neuro grossly intact  ECG

## 2016-03-09 ENCOUNTER — Ambulatory Visit: Payer: Commercial Managed Care - HMO | Admitting: Cardiology

## 2016-04-10 ENCOUNTER — Other Ambulatory Visit: Payer: Self-pay | Admitting: Cardiology

## 2016-04-19 NOTE — Progress Notes (Signed)
HPI: Followup apical hypertrophic cardiomyopathy. A previous exercise treadmill revealed no decrease in systolic blood pressure with exercise. Cardiac catheterization following abnormal nuclear study 12/15 revealed apical variant hypertrophic cardiomyopathy and normal coronary arteries. Also with history of diastolic congestive heart failure. Echocardiogram October 2016 showed normal LV systolic function, grade 2 diastolic dysfunction, mild mitral regurgitation and mild left atrial enlargement. There was severe asymmetric hypertrophy of the apex consistent with apical hypertrophic cardiomyopathy. Holter monitor October 2016 showed no nonsustained ventricular tachycardia. Since she was last seen, the patient denies any dyspnea on exertion, orthopnea, PND, pedal edema, palpitations, syncope or chest pain.   Current Outpatient Prescriptions  Medication Sig Dispense Refill  . aspirin 81 MG EC tablet Take 81 mg by mouth daily.      . furosemide (LASIX) 20 MG tablet TAKE 1 TO 2 TABLETS BY MOUTH DAILY 45 tablet 2  . KLOR-CON M20 20 MEQ tablet TAKE 1 TABLET (20 MEQ TOTAL) BY MOUTH DAILY. 30 tablet 2  . lisinopril (PRINIVIL,ZESTRIL) 20 MG tablet Take 1 tablet (20 mg total) by mouth daily. 90 tablet 2  . metoprolol succinate (TOPROL-XL) 25 MG 24 hr tablet Take 1 tablet by mouth daily.  6  . metoprolol succinate (TOPROL-XL) 25 MG 24 hr tablet TAKE 1 TABLET (25 MG TOTAL) BY MOUTH DAILY. 30 tablet 3  . Multiple Vitamin (MULTIVITAMIN) tablet Take 1 tablet by mouth daily.       No current facility-administered medications for this visit.      Past Medical History:  Diagnosis Date  . Anemia   . Chest pain    cath 7/09: normal cors; EF 65%  . Diastolic congestive heart failure ([place])   . GERD (gastroesophageal reflux disease)   . HOCM (hypertrophic obstructive cardiomyopathy) ([place])    echo 10/11: normal LVF; apical hypertropy; mod LAE; mild MR;      Holter 10/11: no NSVT;     ETT 10/11: no decreased  BP with exercise   . HTN (hypertension)   . Hyperlipidemia     Past Surgical History:  Procedure Laterality Date  . CARDIAC CATHETERIZATION    . LEFT HEART CATHETERIZATION WITH CORONARY/GRAFT ANGIOGRAM N/A 04/21/2014   Procedure: LEFT HEART CATHETERIZATION WITH Beatrix Fetters;  Surgeon: Troy Sine, MD;  Location: [facility] C[facility] LAB;  Service: Cardiovascular;  Laterality: N/A;  . TUBAL LIGATION      Social History   Social History  . Marital status: Married    Spouse name: N/A  . Number of children: 5  . 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 Smoking status: Never Smoker  . Smokeless tobacco: Never Used  . Alcohol use No  . Drug use: No  . Sexual activity: Yes    Birth control/ protection: None   Other Topics Concern  . 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  Is planning on adopting 3 foster children (2012)          Family History  Problem Relation Age of Onset  . Alcohol abuse Father   . Cirrhosis Father   . Asthma Brother   . Cervical cancer Maternal Grandmother     ROS: no fevers or chills, productive cough, hemoptysis, dysphasia, odynophagia, melena, hematochezia, dysuria, hematuria, rash, seizure  activity, orthopnea, PND, pedal edema, claudication. Remaining systems are negative.  Physical Exam: Well-developed well-nourished in no acute distress.  Skin is warm and dry.  HEENT is normal.  Neck is supple.  Chest is clear to auscultation with normal expansion.  Cardiovascular exam is regular rate and rhythm.  Abdominal exam nontender or distended. No masses palpated. Extremities show no edema. neuro grossly intact  ECG-Sinus rhythm at a rate of 60. Left ventricular hypertrophy with repolarization abnormality. A/P  1  hypertension-blood pressure controlled. Continue present medications.  2 apical variant hypertrophic cardiomyopathy-plan to continue Toprol. Repeat echocardiogram. Patient previously instructed to have her children screened and by her report they have complied.   3 chronic diastolic congestive heart failure-continue present dose of Lasix. She appears to be euvolemic on examination. Check potassium and renal function.  4 chest pain-no recent symptoms. Previous catheterization showed normal coronary arteries. No plans for further evaluation at this point.  Kirk Ruths, MD

## 2016-04-26 ENCOUNTER — Other Ambulatory Visit: Payer: Self-pay | Admitting: *Deleted

## 2016-04-26 MED ORDER — LISINOPRIL 20 MG PO TABS: 20.0000 mg | ORAL_TABLET | Freq: Every day | ORAL | 2 refills | Status: DC

## 2016-04-26 NOTE — Telephone Encounter (Signed)
Refill request for 90 day supply.  Noach Calvillo L, RN  

## 2016-04-28 ENCOUNTER — Ambulatory Visit (INDEPENDENT_AMBULATORY_CARE_PROVIDER_SITE_OTHER): Payer: Commercial Managed Care - HMO | Admitting: Cardiology

## 2016-04-28 ENCOUNTER — Encounter: Payer: Self-pay | Admitting: Cardiology

## 2016-04-28 VITALS — BP 128/72 | HR 60 | Ht 65.0 in | Wt 205.0 lb

## 2016-04-28 DIAGNOSIS — I10 Essential (primary) hypertension: Secondary | ICD-10-CM

## 2016-04-28 DIAGNOSIS — I5032 Chronic diastolic (congestive) heart failure: Secondary | ICD-10-CM

## 2016-04-28 DIAGNOSIS — I421 Obstructive hypertrophic cardiomyopathy: Secondary | ICD-10-CM | POA: Diagnosis not present

## 2016-04-28 NOTE — Patient Instructions (Signed)
Medication Instructions:   NO CHANGE  Labwork:  Your physician recommends that you HAVE LAB WORK SAMEDAY AS ECHO AT [facility] [address]  Testing/Procedures:  Your physician has requested that you have an echocardiogram. Echocardiography is a painless test that uses sound waves to create images of your heart. It provides your doctor with information about the size and shape of your heart and how well your heart's chambers and valves are working. This procedure takes approximately one hour. There are no restrictions for this procedure.    Follow-Up:  Your physician wants you to follow-up in: [name] will receive a reminder letter in the mail two months in advance. If you don't receive a letter, please call our office to schedule the follow-up appointment.   If you need a refill on your cardiac medications before your next appointment, please call your pharmacy.

## 2016-04-30 ENCOUNTER — Other Ambulatory Visit: Payer: Self-pay | Admitting: Cardiology

## 2016-05-16 ENCOUNTER — Other Ambulatory Visit (HOSPITAL_COMMUNITY): Payer: Commercial Managed Care - HMO

## 2016-05-25 ENCOUNTER — Other Ambulatory Visit (HOSPITAL_COMMUNITY): Payer: Commercial Managed Care - HMO

## 2016-05-25 ENCOUNTER — Telehealth (HOSPITAL_COMMUNITY): Payer: Self-pay | Admitting: Cardiology

## 2016-05-25 NOTE — Telephone Encounter (Signed)
05/25/16 Left Message - Called pt and lmsg for her to CB to resch appt due to tech not being in the office.     By Verdene Rio

## 2016-06-13 ENCOUNTER — Other Ambulatory Visit: Payer: Self-pay

## 2016-06-13 ENCOUNTER — Ambulatory Visit (HOSPITAL_COMMUNITY): Payer: Commercial Managed Care - HMO | Attending: Cardiovascular Disease

## 2016-06-13 ENCOUNTER — Other Ambulatory Visit (HOSPITAL_COMMUNITY): Payer: Commercial Managed Care - HMO

## 2016-06-13 DIAGNOSIS — I421 Obstructive hypertrophic cardiomyopathy: Secondary | ICD-10-CM | POA: Insufficient documentation

## 2016-06-13 DIAGNOSIS — I119 Hypertensive heart disease without heart failure: Secondary | ICD-10-CM | POA: Insufficient documentation

## 2016-06-13 DIAGNOSIS — N920 Excessive and frequent menstruation with regular cycle: Secondary | ICD-10-CM | POA: Diagnosis not present

## 2016-09-04 ENCOUNTER — Other Ambulatory Visit: Payer: Self-pay | Admitting: *Deleted

## 2016-09-04 MED ORDER — METOPROLOL SUCCINATE ER 25 MG PO TB24: 25.0000 mg | ORAL_TABLET | Freq: Every day | ORAL | 1 refills | Status: DC

## 2016-09-04 NOTE — Telephone Encounter (Signed)
Rx has been sent to the pharmacy electronically. ° °

## 2017-01-23 DIAGNOSIS — Z124 Encounter for screening for malignant neoplasm of cervix: Secondary | ICD-10-CM | POA: Diagnosis not present

## 2017-01-23 DIAGNOSIS — Z01419 Encounter for gynecological examination (general) (routine) without abnormal findings: Secondary | ICD-10-CM | POA: Diagnosis not present

## 2017-01-23 LAB — HM PAP SMEAR: HM Pap smear: NEGATIVE

## 2017-01-30 ENCOUNTER — Ambulatory Visit: Payer: Commercial Managed Care - HMO | Admitting: Family Medicine

## 2017-02-12 ENCOUNTER — Encounter: Payer: Self-pay | Admitting: Family Medicine

## 2017-02-13 ENCOUNTER — Encounter: Payer: Self-pay | Admitting: Family Medicine

## 2017-02-13 ENCOUNTER — Ambulatory Visit (INDEPENDENT_AMBULATORY_CARE_PROVIDER_SITE_OTHER): Payer: 59 | Admitting: Family Medicine

## 2017-02-13 VITALS — BP 110/60 | HR 57 | Temp 97.8°F | Ht 65.0 in | Wt 208.0 lb

## 2017-02-13 DIAGNOSIS — Z23 Encounter for immunization: Secondary | ICD-10-CM

## 2017-02-13 DIAGNOSIS — Z0000 Encounter for general adult medical examination without abnormal findings: Secondary | ICD-10-CM | POA: Diagnosis not present

## 2017-02-13 DIAGNOSIS — Z780 Asymptomatic menopausal state: Secondary | ICD-10-CM

## 2017-02-13 DIAGNOSIS — R7309 Other abnormal glucose: Secondary | ICD-10-CM

## 2017-02-13 DIAGNOSIS — E785 Hyperlipidemia, unspecified: Secondary | ICD-10-CM

## 2017-02-13 DIAGNOSIS — Z114 Encounter for screening for human immunodeficiency virus [HIV]: Secondary | ICD-10-CM | POA: Diagnosis not present

## 2017-02-13 DIAGNOSIS — Z1159 Encounter for screening for other viral diseases: Secondary | ICD-10-CM

## 2017-02-13 DIAGNOSIS — Z0001 Encounter for general adult medical examination with abnormal findings: Secondary | ICD-10-CM

## 2017-02-13 DIAGNOSIS — I509 Heart failure, unspecified: Secondary | ICD-10-CM

## 2017-02-13 DIAGNOSIS — M1990 Unspecified osteoarthritis, unspecified site: Secondary | ICD-10-CM | POA: Diagnosis not present

## 2017-02-13 DIAGNOSIS — Z1211 Encounter for screening for malignant neoplasm of colon: Secondary | ICD-10-CM

## 2017-02-13 LAB — POCT GLYCOSYLATED HEMOGLOBIN (HGB A1C): Hemoglobin A1C: 5.5

## 2017-02-13 NOTE — Progress Notes (Addendum)
Subjective:     [name] is a 56 y.o. female and is here for a comprehensive physical exam. The patient reports problems - finger joint morning stiffness x 1 yrs and upper muscle ache. She stated she had been post menopausal for 1.5 yrs and all of a sudden 3 days ago she started having vaginal bleed. denies any other concern..  Social History   Social History  . Marital status: Married    Spouse name: N/A  . Number of children: 5  . 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 Smoking status: Never Smoker  . Smokeless tobacco: Never Used  . Alcohol use No  . Drug use: No  . Sexual activity: Yes    Birth control/ protection: None   Other Topics Concern  . 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  Is planning on adopting 3 foster children (2012)         Health Maintenance  Topic Date Due  . Hepatitis C Screening  [birth year]  . HIV Screening  07/20/1977  . PAP SMEAR  08/30/2013  . TETANUS/TDAP  12/29/2014  . MAMMOGRAM  11/28/2015  . INFLUENZA VACCINE  07/22/2017 (Originally 11/22/2016)  . COLONOSCOPY  08/16/2023    The following portions of the patient's history were reviewed and updated as appropriate: allergies, current medications, past family history, past medical history, past social history, past surgical history and problem list.  Review of Systems Pertinent items noted in HPI and remainder of comprehensive ROS otherwise negative.   Objective:    BP 110/60   Pulse (!) 57   Temp 97.8 F (36.6 C)   Ht 5\' 5"  (1.651 m)   Wt 208 lb (94.3 kg)   LMP 02/10/2017 (Exact Date)   SpO2 99%   BMI 34.61 kg/m  General appearance: alert and cooperative Head: Normocephalic, without obvious abnormality, atraumatic Eyes: conjunctivae/corneas clear.  PERRL, EOM's intact. Fundi benign. Ears: normal TM's and external ear canals both ears Throat: lips, mucosa, and tongue normal; teeth and gums normal Neck: no adenopathy, no carotid bruit, no JVD, supple, symmetrical, trachea midline and thyroid not enlarged, symmetric, no tenderness/mass/nodules Back: symmetric, no curvature. ROM normal. No CVA tenderness. Lungs: clear to auscultation bilaterally Heart: regular rate and rhythm, S1, S2 normal, no murmur, click, rub or gallop Abdomen: soft, non-tender; bowel sounds normal; no masses,  no organomegaly Pelvic: Deffered. Per patient she is currently bleeding and she just had a normal pelvic exam 2 weeks ago by her gynecologist. Extremities: extremities normal, atraumatic, no cyanosis or edema Skin: Skin color, texture, turgor normal. No rashes or lesions Lymph nodes: Cervical, supraclavicular, and axillary nodes normal. Neurologic: Alert and oriented X 3, normal strength and tone. Normal symmetric reflexes. Normal coordination and gait    Assessment:    Healthy female exam. With abnormal findings.     Plan:     Per patient she had mammogram and PAP done 2 weeks ago at [facility]. She signed release of information form to obtain records. She is up to date with colonoscopy done 07/2013. GI referral canceled. A1C checked today given previous mild elevation. It was normal. Cmet, FLP checked as part of her wellness exam and to assess her chronic conditions. HIV and Hep C screening  recommended and she agreed with testing today. Flu shot recommended but she declined. She agreed to Tdap shot today. Repeat wellness exam in 1 yr.  Regarding joint morning stiffness x 1 yr, I will evaluate for rheumatoid arthritis. Rheumatoid lab profile obtained. Will contact her soon with result.  Also CBC checked for vaginal bleeding. Pelvic US also ordered. I will contact her soon with result.

## 2017-02-13 NOTE — Patient Instructions (Signed)
Postmenopausal Bleeding Postmenopausal bleeding is any bleeding after menopause. Menopause is when a woman's period stops. Any type of bleeding after menopause is concerning. It should be checked by your doctor. Any treatment will depend on the cause. Follow these instructions at home: Watch your condition for any changes.  Avoid the use of tampons and douches as told by your doctor.  Change your pads often.  Get regular pelvic exams and Pap tests.  Keep all appointments for tests as told by your doctor.  Contact a doctor if:  Your bleeding lasts for more than 1 week.  You have belly (abdominal) pain.  You have bleeding after sex (intercourse). Get help right away if:  You have a fever, chills, a headache, dizziness, muscle aches, and bleeding.  You have strong pain with bleeding.  You have clumps of blood (blood clots) coming from your vagina.  You have bleeding and need more than 1 pad an hour.  You feel like you are going to pass out (faint). This information is not intended to replace advice given to you by your health care provider. Make sure you discuss any questions you have with your health care provider. Document Released: 01/18/2008 Document Revised: 09/16/2015 Document Reviewed: 11/07/2012 Elsevier Interactive Patient Education  2017 Elsevier Inc.  

## 2017-02-14 ENCOUNTER — Telehealth: Payer: Self-pay | Admitting: Family Medicine

## 2017-02-14 ENCOUNTER — Encounter: Payer: Self-pay | Admitting: Family Medicine

## 2017-02-14 NOTE — Telephone Encounter (Signed)
Test results discussed with patient. For FLP, her 10 yrs ASCD risk calc is 3.2, she is not in the Statin group.  I clarified colon cancer screening with her. She had colonoscopy April of 2015 with normal polyp biopsy. She is due for colonoscopy in 2025. She verbalized understanding.

## 2017-02-14 NOTE — Addendum Note (Signed)
Addended by: Andrena Mews T on: 02/14/2017 08:56 AM   Modules accepted: Orders

## 2017-02-14 NOTE — Progress Notes (Signed)
error 

## 2017-02-15 ENCOUNTER — Telehealth: Payer: Self-pay | Admitting: Family Medicine

## 2017-02-15 ENCOUNTER — Other Ambulatory Visit: Payer: Self-pay | Admitting: Family Medicine

## 2017-02-15 DIAGNOSIS — M069 Rheumatoid arthritis, unspecified: Secondary | ICD-10-CM

## 2017-02-15 LAB — CMP14+EGFR
ALT: 15 IU/L (ref 0–32)
AST: 19 IU/L (ref 0–40)
Albumin/Globulin Ratio: 1.4 (ref 1.2–2.2)
Albumin: 4.3 g/dL (ref 3.5–5.5)
Alkaline Phosphatase: 79 IU/L (ref 39–117)
BILIRUBIN TOTAL: 0.7 mg/dL (ref 0.0–1.2)
BUN/Creatinine Ratio: 12 (ref 9–23)
BUN: 11 mg/dL (ref 6–24)
CHLORIDE: 103 mmol/L (ref 96–106)
CO2: 22 mmol/L (ref 20–29)
Calcium: 9.4 mg/dL (ref 8.7–10.2)
Creatinine, Ser: 0.89 mg/dL (ref 0.57–1.00)
GFR calc Af Amer: 85 mL/min/{1.73_m2} (ref >59)
GFR calc non Af Amer: 74 mL/min/{1.73_m2} (ref >59)
GLOBULIN, TOTAL: 3.1 g/dL (ref 1.5–4.5)
Glucose: 83 mg/dL (ref 65–99)
POTASSIUM: 4.4 mmol/L (ref 3.5–5.2)
SODIUM: 139 mmol/L (ref 134–144)
Total Protein: 7.4 g/dL (ref 6.0–8.5)

## 2017-02-15 LAB — RA EXPANDED PROFILE
CRP: 1.7 mg/L (ref 0.0–4.9)
Cyclic Citrullin Peptide Ab: 81 units — ABNORMAL HIGH (ref 0–19)
Rhuematoid fact SerPl-aCnc: <10 IU/mL (ref 0.0–13.9)
Sed Rate: 3 mm/hr (ref 0–40)

## 2017-02-15 LAB — CBC WITH DIFFERENTIAL/PLATELET
BASOS ABS: 0 10*3/uL (ref 0.0–0.2)
Basos: 1 %
EOS (ABSOLUTE): 0.2 10*3/uL (ref 0.0–0.4)
Eos: 2 %
Hematocrit: 39.7 % (ref 34.0–46.6)
Hemoglobin: 12.8 g/dL (ref 11.1–15.9)
IMMATURE GRANULOCYTES: 0 %
Immature Grans (Abs): 0 10*3/uL (ref 0.0–0.1)
LYMPHS: 33 %
Lymphocytes Absolute: 2.4 10*3/uL (ref 0.7–3.1)
MCH: 26.1 pg — AB (ref 26.6–33.0)
MCHC: 32.2 g/dL (ref 31.5–35.7)
MCV: 81 fL (ref 79–97)
MONOS ABS: 0.5 10*3/uL (ref 0.1–0.9)
Monocytes: 7 %
NEUTROS ABS: 4.2 10*3/uL (ref 1.4–7.0)
NEUTROS PCT: 57 %
PLATELETS: 254 10*3/uL (ref 150–379)
RBC: 4.91 x10E6/uL (ref 3.77–5.28)
RDW: 16 % — ABNORMAL HIGH (ref 12.3–15.4)
WBC: 7.3 10*3/uL (ref 3.4–10.8)

## 2017-02-15 LAB — LIPID PANEL
Chol/HDL Ratio: 4.2 ratio (ref 0.0–4.4)
Cholesterol, Total: 156 mg/dL (ref 100–199)
HDL: 37 mg/dL — ABNORMAL LOW (ref >39)
LDL CALC: 102 mg/dL — AB (ref 0–99)
Triglycerides: 85 mg/dL (ref 0–149)
VLDL CHOLESTEROL CAL: 17 mg/dL (ref 5–40)

## 2017-02-15 LAB — HEPATITIS C ANTIBODY: HEP C VIRUS AB: 0.4 {s_co_ratio} (ref 0.0–0.9)

## 2017-02-15 LAB — HIV ANTIBODY (ROUTINE TESTING W REFLEX): HIV SCREEN 4TH GENERATION: NONREACTIVE

## 2017-02-15 NOTE — Telephone Encounter (Signed)
Anti CCP result discussed with patient. It was strongly positive. Referral to rheumatologist recommended. She agreed with plan.

## 2017-02-20 ENCOUNTER — Other Ambulatory Visit: Payer: 59

## 2017-03-23 ENCOUNTER — Other Ambulatory Visit: Payer: Self-pay | Admitting: Cardiology

## 2017-05-14 ENCOUNTER — Encounter: Payer: Self-pay | Admitting: Family Medicine

## 2017-05-16 ENCOUNTER — Other Ambulatory Visit: Payer: Self-pay | Admitting: Cardiology

## 2017-05-16 NOTE — Telephone Encounter (Signed)
Rx has been sent to the pharmacy electronically. ° °

## 2017-06-12 ENCOUNTER — Other Ambulatory Visit: Payer: Self-pay | Admitting: Cardiology

## 2017-06-12 NOTE — Telephone Encounter (Signed)
Rx(s) sent to pharmacy electronically.  

## 2017-07-03 ENCOUNTER — Other Ambulatory Visit: Payer: Self-pay | Admitting: Cardiology

## 2017-09-04 ENCOUNTER — Ambulatory Visit (HOSPITAL_COMMUNITY)
Admission: RE | Admit: 2017-09-04 | Discharge: 2017-09-04 | Disposition: A | Discharge status: Home / Self Care | Payer: 59 | Source: Ambulatory Visit | Attending: Family Medicine | Admitting: Family Medicine

## 2017-09-04 ENCOUNTER — Telehealth: Payer: Self-pay | Admitting: Family Medicine

## 2017-09-04 ENCOUNTER — Other Ambulatory Visit: Payer: Self-pay

## 2017-09-04 ENCOUNTER — Ambulatory Visit: Payer: 59 | Admitting: Family Medicine

## 2017-09-04 ENCOUNTER — Encounter: Payer: Self-pay | Admitting: Family Medicine

## 2017-09-04 VITALS — BP 122/64 | HR 56 | Temp 97.9°F | Ht 65.0 in | Wt 207.0 lb

## 2017-09-04 DIAGNOSIS — R05 Cough: Secondary | ICD-10-CM | POA: Insufficient documentation

## 2017-09-04 DIAGNOSIS — R042 Hemoptysis: Secondary | ICD-10-CM

## 2017-09-04 DIAGNOSIS — M67431 Ganglion, right wrist: Secondary | ICD-10-CM | POA: Insufficient documentation

## 2017-09-04 DIAGNOSIS — M25512 Pain in left shoulder: Secondary | ICD-10-CM | POA: Diagnosis not present

## 2017-09-04 DIAGNOSIS — H9319 Tinnitus, unspecified ear: Secondary | ICD-10-CM | POA: Insufficient documentation

## 2017-09-04 DIAGNOSIS — R059 Cough, unspecified: Secondary | ICD-10-CM

## 2017-09-04 DIAGNOSIS — M791 Myalgia, unspecified site: Secondary | ICD-10-CM | POA: Diagnosis not present

## 2017-09-04 DIAGNOSIS — I517 Cardiomegaly: Secondary | ICD-10-CM | POA: Diagnosis not present

## 2017-09-04 DIAGNOSIS — H9313 Tinnitus, bilateral: Secondary | ICD-10-CM

## 2017-09-04 MED ORDER — BENZONATATE 100 MG PO CAPS
100.0000 mg | ORAL_CAPSULE | Freq: Two times a day (BID) | ORAL | 0 refills | Status: DC | PRN
Start: 2017-09-04 — End: 2018-06-25

## 2017-09-04 NOTE — Assessment & Plan Note (Signed)
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 No trauma, hence rotator cuff syndrome less likely although a possibility. Since this has been going on for few weeks, we will watch with some home exercise and Tylenol as needed for pain. Consider imaging and PT in the future. She agreed with plan.

## 2017-09-04 NOTE — Assessment & Plan Note (Signed)
She stated she just does not feel well. I could not tie this to any diagnosis. Could stress related with anxiety. I reassured her we will work with her to keep her in good health. Tylenol as needed for pain. Rest at home as needed. F/U sooner if it persists.

## 2017-09-04 NOTE — Assessment & Plan Note (Signed)
Treatment options discussed. Watch vs drain vs remove surgically. She stated it does not bother her, hence she will like to watch.

## 2017-09-04 NOTE — Patient Instructions (Signed)
Shoulder Pain Many things can cause shoulder pain, including:  An injury.  Moving the arm in the same way again and again (overuse).  Joint pain (arthritis).  Follow these instructions at home: Take these actions to help with your pain:  Squeeze a soft ball or a foam pad as much as you can. This helps to prevent swelling. It also makes the arm stronger.  Take over-the-counter and prescription medicines only as told by your doctor.  If told, put ice on the area: ? Put ice in a plastic bag. ? Place a towel between your skin and the bag. ? Leave the ice on for 20 minutes, 2-3 times per day. Stop putting on ice if it does not help with the pain.  If you were given a shoulder sling or immobilizer: ? Wear it as told. ? Remove it to shower or bathe. ? Move your arm as little as possible. ? Keep your hand moving. This helps prevent swelling.  Contact a doctor if:  Your pain gets worse.  Medicine does not help your pain.  You have new pain in your arm, hand, or fingers. Get help right away if:  Your arm, hand, or fingers: ? Tingle. ? Are numb. ? Are swollen. ? Are painful. ? Turn white or blue. This information is not intended to replace advice given to you by your health care provider. Make sure you discuss any questions you have with your health care provider. Document Released: 09/27/2007 Document Revised: 12/05/2015 Document Reviewed: 08/03/2014 Elsevier Interactive Patient Education  2018 Elsevier Inc.  

## 2017-09-04 NOTE — Assessment & Plan Note (Signed)
Ear exam benign. May be medication induced ( ASA or Lasix). Need to be on both meds. Consider neuroimaging in the future if it persists or worsens. She agreed with plan. F/U sooner if warranted.

## 2017-09-04 NOTE — Assessment & Plan Note (Signed)
Concern about brownish sputum production. Symptoms and sign not suggestive of infection. Low risk for cancer. Chest xray however ordered to further evaluate.

## 2017-09-04 NOTE — Telephone Encounter (Signed)
Xray result discussed with her. No new changes. Bronchitic changes has been since 2015. I will not start A/B at this time. She is instructed to call if symptoms worsens. Start tessalon pearl for cough.

## 2017-09-04 NOTE — Progress Notes (Signed)
Subjective:     Patient ID: [name], female   DOB: 1960/08/24, 57 y.o.   MRN: [id number]  Cough  This is a new problem. Episode onset: few weeks ago. The problem has been waxing and waning. The problem occurs constantly. The cough is productive of brown sputum. Pertinent negatives include no chest pain, chills, fever, shortness of breath or wheezing. Nothing aggravates the symptoms. She has tried OTC cough suppressant for the symptoms. The treatment provided no relief. There is no history of asthma, COPD or pneumonia. Never a smoker  Shoulder Pain   The pain is present in the left shoulder. This is a new problem. Episode onset: 3 weeks ago. There has been no history of extremity trauma. The problem occurs intermittently. The problem has been unchanged. The quality of the pain is described as aching. The pain is at a severity of 10/10. Pain severity now: Wakes her up at night. Pertinent negatives include no fever, joint swelling or numbness. The symptoms are aggravated by activity. She has tried cold, rest, acetaminophen and NSAIDS for the symptoms. The treatment provided no relief. There is no history of osteoarthritis. Never a smoker  Knot on hand: C/O Knot on her right wrist for few months. Not painful, not getting bigger. She had similar knot in her foot which goes away and come right back.  Tinnitus: C/O ringing in her ears B/L. Ongoing for few months on and off,she denies hearing loss, no abnormal gait, no dizziness, no headache. Body aches: She feels that her whole body is falling apart due to stressors.Her marriage is on the rock,she has teenagers x 3, she is caring for her sick mom, work is stressful. She recently lost some of her friends to cancer and she is worried about her life as well.  Current Outpatient Medications on File Prior to Visit  Medication Sig Dispense Refill  . aspirin 81 MG EC tablet Take 81 mg by mouth daily.      . Cholecalciferol (VITAMIN D3) 25 MCG TABS Take 25  mcg by mouth daily.    . furosemide (LASIX) 20 MG tablet TAKE 1 TO 2 TABLETS BY MOUTH DAILY. <PLEASE MAKE APPOINTMENT FOR REFILLS> 45 tablet 0  . KLOR-CON M20 20 MEQ tablet TAKE 1 TABLET (20 MEQ TOTAL) BY MOUTH DAILY. 30 tablet 2  . lisinopril (PRINIVIL,ZESTRIL) 20 MG tablet TAKE 1 TABLET (20 MG TOTAL) BY MOUTH DAILY. 90 tablet 2  . metoprolol succinate (TOPROL-XL) 25 MG 24 hr tablet TAKE 1 TABLET BY MOUTH EVERY DAY 90 tablet 1   No current facility-administered medications on file prior to visit.    Past Medical History:  Diagnosis Date  . Abnormal nuclear cardiac imaging test 04/22/2014  . Anemia   . Chest pain    cath 7/09: normal cors; EF 65%  . Diastolic congestive heart failure ([place])   . Dyspepsia and other specified disorders of function of stomach 07/04/2013  . Ganglion cyst 01/24/2014  . GERD (gastroesophageal reflux disease)   . HOCM (hypertrophic obstructive cardiomyopathy) ([place])    echo 10/11: normal LVF; apical hypertropy; mod LAE; mild MR;      Holter 10/11: no NSVT;     ETT 10/11: no decreased BP with exercise   . HTN (hypertension)   . Hyperlipidemia   . 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 SYNCOPE 01/28/2010   Qualifier: Diagnosis of  By: Stanford Breed, MD, Kandyce Rud      Review of Systems  Constitutional: Negative for chills and  fever.  HENT: Positive for tinnitus.   Respiratory: Positive for cough. Negative for shortness of breath and wheezing.   Cardiovascular: Negative for chest pain.  Gastrointestinal: Negative.   Genitourinary: Negative.   Musculoskeletal: Positive for arthralgias.  Skin:       Knot on wrist  Neurological: Negative.  Negative for numbness.  Psychiatric/Behavioral: Negative.   All other systems reviewed and are negative.      Objective:   Physical Exam  Constitutional: She is oriented to person, place, and time. She appears well-developed. No distress.  HENT:  Head: Normocephalic.  Right Ear: Tympanic membrane and ear canal  normal. No tenderness.  Left Ear: Tympanic membrane and ear canal normal. No tenderness.  Cardiovascular: Normal rate, regular rhythm and normal heart sounds.  No murmur heard. Pulmonary/Chest: Effort normal and breath sounds normal. No stridor. No respiratory distress.  Abdominal: Soft. Bowel sounds are normal. She exhibits no distension. There is no tenderness.  Musculoskeletal: She exhibits no edema.       Right shoulder: Normal.       Left shoulder: She exhibits decreased range of motion and tenderness.       Left wrist: She exhibits normal range of motion and no tenderness.       Arms: Neurological: She is alert and oriented to person, place, and time. No cranial nerve deficit or sensory deficit. Coordination normal.  Nursing note and vitals reviewed.      Assessment:     Cough Shoulder pain Ganglion cyst Tinnitus Myalgia    Plan:     Check problem list.

## 2017-10-02 ENCOUNTER — Other Ambulatory Visit: Payer: Self-pay | Admitting: Cardiology

## 2017-10-30 ENCOUNTER — Other Ambulatory Visit: Payer: Self-pay

## 2017-10-30 ENCOUNTER — Encounter (HOSPITAL_COMMUNITY): Payer: Self-pay | Admitting: Emergency Medicine

## 2017-10-30 ENCOUNTER — Emergency Department (HOSPITAL_COMMUNITY)
Admission: EM | Admit: 2017-10-30 | Discharge: 2017-10-31 | Disposition: A | Discharge status: Home / Self Care | Payer: 59 | Attending: Emergency Medicine | Admitting: Emergency Medicine

## 2017-10-30 DIAGNOSIS — M545 Low back pain: Secondary | ICD-10-CM | POA: Insufficient documentation

## 2017-10-30 DIAGNOSIS — J029 Acute pharyngitis, unspecified: Secondary | ICD-10-CM | POA: Insufficient documentation

## 2017-10-30 DIAGNOSIS — R200 Anesthesia of skin: Secondary | ICD-10-CM | POA: Diagnosis not present

## 2017-10-30 DIAGNOSIS — R509 Fever, unspecified: Secondary | ICD-10-CM | POA: Diagnosis not present

## 2017-10-30 DIAGNOSIS — R05 Cough: Secondary | ICD-10-CM | POA: Diagnosis not present

## 2017-10-30 DIAGNOSIS — Z5321 Procedure and treatment not carried out due to patient leaving prior to being seen by health care provider: Secondary | ICD-10-CM | POA: Diagnosis not present

## 2017-10-30 DIAGNOSIS — R51 Headache: Secondary | ICD-10-CM | POA: Insufficient documentation

## 2017-10-30 LAB — URINALYSIS, ROUTINE W REFLEX MICROSCOPIC
BACTERIA UA: NONE SEEN
BILIRUBIN URINE: NEGATIVE
Glucose, UA: NEGATIVE mg/dL
KETONES UR: NEGATIVE mg/dL
LEUKOCYTES UA: NEGATIVE
NITRITE: NEGATIVE
PH: 6 (ref 5.0–8.0)
Protein, ur: NEGATIVE mg/dL
SPECIFIC GRAVITY, URINE: 1.004 — AB (ref 1.005–1.030)

## 2017-10-30 LAB — I-STAT TROPONIN, ED: TROPONIN I, POC: 0.07 ng/mL (ref 0.00–0.08)

## 2017-10-30 LAB — CBC
HEMATOCRIT: 44 % (ref 36.0–46.0)
HEMOGLOBIN: 13.8 g/dL (ref 12.0–15.0)
MCH: 26 pg (ref 26.0–34.0)
MCHC: 31.4 g/dL (ref 30.0–36.0)
MCV: 82.9 fL (ref 78.0–100.0)
Platelets: 178 10*3/uL (ref 150–400)
RBC: 5.31 MIL/uL — AB (ref 3.87–5.11)
RDW: 15.6 % — ABNORMAL HIGH (ref 11.5–15.5)
WBC: 7.2 10*3/uL (ref 4.0–10.5)

## 2017-10-30 LAB — I-STAT BETA HCG BLOOD, ED (MC, WL, AP ONLY): I-stat hCG, quantitative: <5 m[IU]/mL (ref <5)

## 2017-10-30 LAB — DIFFERENTIAL
ABS IMMATURE GRANULOCYTES: 0 10*3/uL (ref 0.0–0.1)
BASOS PCT: 1 %
Basophils Absolute: 0 10*3/uL (ref 0.0–0.1)
Eosinophils Absolute: 0 10*3/uL (ref 0.0–0.7)
Eosinophils Relative: 1 %
Immature Granulocytes: 0 %
LYMPHS PCT: 10 %
Lymphs Abs: 0.7 10*3/uL (ref 0.7–4.0)
MONO ABS: 1.1 10*3/uL — AB (ref 0.1–1.0)
MONOS PCT: 16 %
NEUTROS ABS: 5.3 10*3/uL (ref 1.7–7.7)
Neutrophils Relative %: 72 %

## 2017-10-30 NOTE — ED Triage Notes (Signed)
Pt reports some lower back pain, dry cough, sore throat and fever that started today. Pt reports taking Tylenol at home. Pt reports waking up at 2200 tonight with a worsening headache and numbness to face. Pain 10/10. Denies N/V/D. Denies urinary issues. Hx of HTN and is compliant with medications.

## 2017-10-31 ENCOUNTER — Other Ambulatory Visit: Payer: Self-pay | Admitting: *Deleted

## 2017-10-31 LAB — COMPREHENSIVE METABOLIC PANEL
ALT: 27 U/L (ref 0–44)
ANION GAP: 9 (ref 5–15)
AST: 26 U/L (ref 15–41)
Albumin: 4.2 g/dL (ref 3.5–5.0)
Alkaline Phosphatase: 59 U/L (ref 38–126)
BILIRUBIN TOTAL: 1.3 mg/dL — AB (ref 0.3–1.2)
BUN: 13 mg/dL (ref 6–20)
CHLORIDE: 105 mmol/L (ref 98–111)
CO2: 22 mmol/L (ref 22–32)
Calcium: 9.3 mg/dL (ref 8.9–10.3)
Creatinine, Ser: 1.04 mg/dL — ABNORMAL HIGH (ref 0.44–1.00)
GFR calc Af Amer: >60 mL/min (ref >60)
GFR calc non Af Amer: 59 mL/min — ABNORMAL LOW (ref >60)
Glucose, Bld: 105 mg/dL — ABNORMAL HIGH (ref 70–99)
POTASSIUM: 3.9 mmol/L (ref 3.5–5.1)
SODIUM: 136 mmol/L (ref 135–145)
TOTAL PROTEIN: 7.8 g/dL (ref 6.5–8.1)

## 2017-10-31 MED ORDER — FUROSEMIDE 20 MG PO TABS: 20.0000 mg | ORAL_TABLET | Freq: Every day | ORAL | 0 refills | Status: DC

## 2017-10-31 NOTE — ED Notes (Signed)
Pt declined xray as ordered. She states she will go to her PCP today.  [facility] notifed

## 2017-10-31 NOTE — ED Provider Notes (Cosign Needed)
1:48 AM Attempted to go and see patient. No one in bed and no belongings noted. Called Xray to see if patient present; Xray told by transporter that "patient refused imaging and said she was leaving". Disposition set to eloped.   Antonietta Breach, PA-C 10/31/17 0151

## 2017-12-06 ENCOUNTER — Other Ambulatory Visit: Payer: Self-pay | Admitting: Family Medicine

## 2017-12-28 ENCOUNTER — Telehealth: Payer: Self-pay | Admitting: Cardiology

## 2017-12-28 MED ORDER — FUROSEMIDE 20 MG PO TABS: 20.0000 mg | ORAL_TABLET | Freq: Every day | ORAL | 2 refills | Status: DC

## 2017-12-28 NOTE — Telephone Encounter (Signed)
New message:       *STAT* If patient is at the pharmacy, call can be transferred to refill team.   1. Which medications need to be refilled? (please list name of each medication and dose if known) furosemide (LASIX) 20 MG tablet  2. Which pharmacy/location (including street and city if local pharmacy) is medication to be sent to?[id number] - [place], [place] - [address]  3. Do they need a 30 day or 90 day supply? [place]

## 2018-02-25 NOTE — Progress Notes (Deleted)
HPI: Followup apical hypertrophic cardiomyopathy. A previous exercise treadmill revealed no decrease in systolic blood pressure with exercise. Cardiac catheterization following abnormal nuclear study 12/15 revealed apical variant hypertrophic cardiomyopathy and normal coronary arteries. Also with history of diastolic congestive heart failure. Holter monitor October 2016 showed no nonsustained ventricular tachycardia. Echo 2/18 showed vigorous LV function, severe apical hypertrophy, moderate diastolic dysfunction, severe LAE. Since she was last seen,   Current Outpatient Medications  Medication Sig Dispense Refill  . aspirin 81 MG EC tablet Take 81 mg by mouth daily.      . benzonatate (TESSALON) 100 MG capsule Take 1 capsule (100 mg total) by mouth 2 (two) times daily as needed for cough. 20 capsule 0  . Cholecalciferol (VITAMIN D3) 25 MCG TABS Take 25 mcg by mouth daily.    . furosemide (LASIX) 20 MG tablet Take 1-2 tablets (20-40 mg total) by mouth daily. KEEP OV. 60 tablet 2  . KLOR-CON M20 20 MEQ tablet TAKE 1 TABLET (20 MEQ TOTAL) BY MOUTH DAILY. 30 tablet 2  . lisinopril (PRINIVIL,ZESTRIL) 20 MG tablet TAKE 1 TABLET (20 MG TOTAL) BY MOUTH DAILY. 90 tablet 2  . metoprolol succinate (TOPROL-XL) 25 MG 24 hr tablet TAKE 1 TABLET BY MOUTH EVERY DAY 90 tablet 1   No current facility-administered medications for this visit.      Past Medical History:  Diagnosis Date  . Abnormal nuclear cardiac imaging test 04/22/2014  . Anemia   . Chest pain    cath 7/09: normal cors; EF 65%  . Diastolic congestive heart failure ([place])   . Dyspepsia and other specified disorders of function of stomach 07/04/2013  . Ganglion cyst 01/24/2014  . GERD (gastroesophageal reflux disease)   . HOCM (hypertrophic obstructive cardiomyopathy) ([place])    echo 10/11: normal LVF; apical hypertropy; mod LAE; mild MR;      Holter 10/11: no NSVT;     ETT 10/11: no decreased BP with exercise   . HTN (hypertension)   .  Hyperlipidemia   . 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 SYNCOPE 01/28/2010   Qualifier: Diagnosis of  By: Stanford Breed, MD, Kandyce Rud     Past Surgical History:  Procedure Laterality Date  . CARDIAC CATHETERIZATION    . LEFT HEART CATHETERIZATION WITH CORONARY/GRAFT ANGIOGRAM N/A 04/21/2014   Procedure: LEFT HEART CATHETERIZATION WITH Beatrix Fetters;  Surgeon: Troy Sine, MD;  Location: [facility] CATH LAB;  Service: Cardiovascular;  Laterality: N/A;  . TUBAL LIGATION      Social History   Socioeconomic History  . Marital status: Married    Spouse name: Not on file  . Number of children: 5  . Years of education: Not on file  . Highest education level: Not on file  Occupational History  . 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 Financial resource strain: Not on file  . Food insecurity:    Worry: Not on file    Inability: Not on file  . Transportation needs:    Medical: Not on file    Non-medical: Not on file  Tobacco Use  . Smoking status: Never Smoker  . Smokeless tobacco: Never Used  Substance and Sexual Activity  . Alcohol use: No  . Drug use: No  . Sexual activity: Yes    Birth control/protection: None  Lifestyle  . Physical activity:    Days per week: Not on file    Minutes per session: Not on  file  . Stress: Not on file  Relationships  . Social connections:    Talks on phone: Not on file    Gets together: Not on file    Attends religious service: Not on file    Active member of club or organization: Not on file    Attends meetings of clubs or organizations: Not on file    Relationship status: Not on file  . Intimate partner violence:    Fear of current or ex partner: Not on file    Emotionally abused: Not on file    Physically abused: Not on file    Forced sexual activity: Not on file  Other Topics Concern  . 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  Is planning on adopting 3 foster children (2012)          Family History  Problem Relation Age of Onset  . Alcohol abuse Father   . Cirrhosis Father   . Asthma Brother   . Cervical cancer Maternal Grandmother     ROS: no fevers or chills, productive cough, hemoptysis, dysphasia, odynophagia, melena, hematochezia, dysuria, hematuria, rash, seizure activity, orthopnea, PND, pedal edema, claudication. Remaining systems are negative.  Physical Exam: Well-developed well-nourished in no acute distress.  Skin is warm and dry.  HEENT is normal.  Neck is supple.  Chest is clear to auscultation with normal expansion.  Cardiovascular exam is regular rate and rhythm.  Abdominal exam nontender or distended. No masses palpated. Extremities show no edema. neuro grossly intact  ECG- personally reviewed  A/P  1  Kirk Ruths, MD

## 2018-02-26 DIAGNOSIS — N950 Postmenopausal bleeding: Secondary | ICD-10-CM | POA: Diagnosis not present

## 2018-02-26 DIAGNOSIS — Z1231 Encounter for screening mammogram for malignant neoplasm of breast: Secondary | ICD-10-CM | POA: Diagnosis not present

## 2018-02-26 DIAGNOSIS — Z01419 Encounter for gynecological examination (general) (routine) without abnormal findings: Secondary | ICD-10-CM | POA: Diagnosis not present

## 2018-02-26 DIAGNOSIS — Z6834 Body mass index (BMI) 34.0-34.9, adult: Secondary | ICD-10-CM | POA: Diagnosis not present

## 2018-02-26 DIAGNOSIS — N3941 Urge incontinence: Secondary | ICD-10-CM | POA: Diagnosis not present

## 2018-03-05 DIAGNOSIS — N950 Postmenopausal bleeding: Secondary | ICD-10-CM | POA: Diagnosis not present

## 2018-03-06 ENCOUNTER — Ambulatory Visit: Payer: 59 | Admitting: Cardiology

## 2018-03-06 ENCOUNTER — Other Ambulatory Visit: Payer: Self-pay | Admitting: Obstetrics and Gynecology

## 2018-03-06 DIAGNOSIS — N939 Abnormal uterine and vaginal bleeding, unspecified: Secondary | ICD-10-CM | POA: Diagnosis not present

## 2018-03-07 ENCOUNTER — Encounter: Payer: Self-pay | Admitting: Cardiology

## 2018-03-23 ENCOUNTER — Other Ambulatory Visit: Payer: Self-pay | Admitting: Cardiology

## 2018-06-20 NOTE — Progress Notes (Signed)
HPI: Followup apical hypertrophic cardiomyopathy. A previous exercise treadmill revealed no decrease in systolic blood pressure with exercise. Cardiac catheterization following abnormal nuclear study 12/15 revealed apical variant hypertrophic cardiomyopathy and normal coronary arteries. Also with history of diastolic congestive heart failure. Holter monitor October 2016 showed no nonsustained ventricular tachycardia.   Last echocardiogram February 2018 showed normal LV function, severe hypertrophy of the apical myocardium, dynamic obstruction with peak velocity of 2.1 m/s, moderate diastolic dysfunction, severe left atrial enlargement.  Since she was last seen,  there is no dyspnea, chest pain, palpitations or syncope.  Occasional mild pedal edema.  Current Outpatient Medications  Medication Sig Dispense Refill  . aspirin 81 MG EC tablet Take 81 mg by mouth daily.      . Cholecalciferol (VITAMIN D3) 25 MCG TABS Take 25 mcg by mouth daily.    . furosemide (LASIX) 20 MG tablet TAKE 1-2 TABLETS (20-40 MG TOTAL) BY MOUTH DAILY. KEEP OFFICE VISIT. 180 tablet 0  . KLOR-CON M20 20 MEQ tablet TAKE 1 TABLET (20 MEQ TOTAL) BY MOUTH DAILY. 30 tablet 2  . lisinopril (PRINIVIL,ZESTRIL) 20 MG tablet TAKE 1 TABLET (20 MG TOTAL) BY MOUTH DAILY. 90 tablet 2  . metoprolol succinate (TOPROL-XL) 25 MG 24 hr tablet TAKE 1 TABLET BY MOUTH EVERY DAY 90 tablet 1   No current facility-administered medications for this visit.      Past Medical History:  Diagnosis Date  . Abnormal nuclear cardiac imaging test 04/22/2014  . Anemia   . Chest pain    cath 7/09: normal cors; EF 65%  . Diastolic congestive heart failure ([place])   . Dyspepsia and other specified disorders of function of stomach 07/04/2013  . Ganglion cyst 01/24/2014  . GERD (gastroesophageal reflux disease)   . HOCM (hypertrophic obstructive cardiomyopathy) ([place])    echo 10/11: normal LVF; apical hypertropy; mod LAE; mild MR;      Holter 10/11: no  NSVT;     ETT 10/11: no decreased BP with exercise   . HTN (hypertension)   . Hyperlipidemia   . 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 SYNCOPE 01/28/2010   Qualifier: Diagnosis of  By: Stanford Breed, MD, Kandyce Rud     Past Surgical History:  Procedure Laterality Date  . CARDIAC CATHETERIZATION    . LEFT HEART CATHETERIZATION WITH CORONARY/GRAFT ANGIOGRAM N/A 04/21/2014   Procedure: LEFT HEART CATHETERIZATION WITH Beatrix Fetters;  Surgeon: Troy Sine, MD;  Location: [facility] CATH LAB;  Service: Cardiovascular;  Laterality: N/A;  . TUBAL LIGATION      Social History   Socioeconomic History  . Marital status: Married    Spouse name: Not on file  . Number of children: 5  . Years of education: Not on file  . Highest education level: Not on file  Occupational History  . 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 Financial resource strain: Not on file  . Food insecurity:    Worry: Not on file    Inability: Not on file  . Transportation needs:    Medical: Not on file    Non-medical: Not on file  Tobacco Use  . Smoking status: Never Smoker  . Smokeless tobacco: Never Used  Substance and Sexual Activity  . Alcohol use: No  . Drug use: No  . Sexual activity: Yes    Birth control/protection: None  Lifestyle  . Physical activity:    Days per week: Not on file  Minutes per session: Not on file  . Stress: Not on file  Relationships  . Social connections:    Talks on phone: Not on file    Gets together: Not on file    Attends religious service: Not on file    Active member of club or organization: Not on file    Attends meetings of clubs or organizations: Not on file    Relationship status: Not on file  . Intimate partner violence:    Fear of current or ex partner: Not on file    Emotionally abused: Not on file    Physically abused: Not on file    Forced sexual activity: Not on file  Other Topics Concern  . 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 Alcohol abuse Father   . Cirrhosis Father   . Asthma Brother   . Cervical cancer Maternal Grandmother     ROS: no fevers or chills, productive cough, hemoptysis, dysphasia, odynophagia, melena, hematochezia, dysuria, hematuria, rash, seizure activity, orthopnea, PND, pedal edema, claudication. Remaining systems are negative.  Physical Exam: Well-developed well-nourished in no acute distress.  Skin is warm and dry.  HEENT is normal.  Neck is supple.  Chest is clear to auscultation with normal expansion.  Cardiovascular exam is regular rate and rhythm.  Abdominal exam nontender or distended. No masses palpated. Extremities show no edema. neuro grossly intact  ECG-sinus rhythm at a rate of 62, inferior lateral T wave inversion.  Personally reviewed  A/P  1 apical variant hypertrophic cardiomyopathy-plan to continue beta-blocker.  Repeat echocardiogram.  Patient instructed on the importance of having her children screened.  2 hypertension-patient's blood pressure is controlled.  Continue present medications and follow.  3 chronic diastolic congestive heart failure-patient appears to be euvolemic.  Continue present dose of Lasix.  Continue fluid restriction and low-sodium diet.  Check potassium and renal function.  Kirk Ruths, MD

## 2018-06-25 ENCOUNTER — Other Ambulatory Visit: Payer: Self-pay | Admitting: *Deleted

## 2018-06-25 ENCOUNTER — Ambulatory Visit: Payer: 59 | Admitting: Cardiology

## 2018-06-25 ENCOUNTER — Encounter: Payer: Self-pay | Admitting: Cardiology

## 2018-06-25 VITALS — BP 134/76 | HR 62 | Ht 65.0 in | Wt 182.0 lb

## 2018-06-25 DIAGNOSIS — I5032 Chronic diastolic (congestive) heart failure: Secondary | ICD-10-CM | POA: Diagnosis not present

## 2018-06-25 DIAGNOSIS — I10 Essential (primary) hypertension: Secondary | ICD-10-CM

## 2018-06-25 DIAGNOSIS — I421 Obstructive hypertrophic cardiomyopathy: Secondary | ICD-10-CM | POA: Diagnosis not present

## 2018-06-25 MED ORDER — METOPROLOL SUCCINATE ER 25 MG PO TB24: 25.0000 mg | ORAL_TABLET | Freq: Every day | ORAL | 3 refills | Status: DC

## 2018-06-25 NOTE — Patient Instructions (Signed)
Medication Instructions:  NO CHANGE If you need a refill on your cardiac medications before your next appointment, please call your pharmacy.   Lab work: Your physician recommends that you HAVE LAB WORK TODAY If you have labs (blood work) drawn today and your tests are completely normal, you will receive your results only by: Marland Kitchen MyChart Message (if you have MyChart) OR . A paper copy in the mail If you have any lab test that is abnormal or we need to change your treatment, we will call you to review the results.  Testing/Procedures: Your physician has requested that you have an echocardiogram. Echocardiography is a painless test that uses sound waves to create images of your heart. It provides your doctor with information about the size and shape of your heart and how well your heart's chambers and valves are working. This procedure takes approximately one hour. There are no restrictions for this procedure.  [place]  Follow-Up: At [facility], you and your health needs are our priority.  As part of our continuing mission to provide you with exceptional heart care, we have created designated Provider Care Teams.  These Care Teams include your primary Cardiologist (physician) and Advanced Practice Providers (APPs -  Physician Assistants and Nurse Practitioners) who all work together to provide you with the care you need, when you need it. You will need a follow up appointment in 12 months.  Please call our office 2 months in advance to schedule this appointment.  You may see Kirk Ruths, MD or one of the following Advanced Practice Providers on your designated Care Team:   Kerin Ransom, PA-C Roby Lofts, [place] . Sande Rives, PA-C

## 2018-07-02 ENCOUNTER — Other Ambulatory Visit (HOSPITAL_COMMUNITY): Payer: 59

## 2018-07-03 ENCOUNTER — Other Ambulatory Visit: Payer: Self-pay

## 2018-07-03 DIAGNOSIS — I421 Obstructive hypertrophic cardiomyopathy: Secondary | ICD-10-CM | POA: Diagnosis not present

## 2018-07-03 MED ORDER — FUROSEMIDE 20 MG PO TABS: ORAL_TABLET | ORAL | 1 refills | Status: DC

## 2018-07-04 LAB — BASIC METABOLIC PANEL
BUN / CREAT RATIO: 15 (ref 9–23)
BUN: 16 mg/dL (ref 6–24)
CO2: 22 mmol/L (ref 20–29)
Calcium: 9 mg/dL (ref 8.7–10.2)
Chloride: 105 mmol/L (ref 96–106)
Creatinine, Ser: 1.04 mg/dL — ABNORMAL HIGH (ref 0.57–1.00)
GFR calc Af Amer: 70 mL/min/{1.73_m2} (ref >59)
GFR, EST NON AFRICAN AMERICAN: 61 mL/min/{1.73_m2} (ref >59)
GLUCOSE: 78 mg/dL (ref 65–99)
POTASSIUM: 4.3 mmol/L (ref 3.5–5.2)
SODIUM: 142 mmol/L (ref 134–144)

## 2018-07-09 ENCOUNTER — Telehealth: Payer: Self-pay | Admitting: Cardiology

## 2018-07-09 NOTE — Telephone Encounter (Signed)
I have personally called patient today to reschedule her 2D echocardiogram due to the coronavirus pandemic.  Patient was placed to have an echo to assess reassess her known apical variant h[place].  She is doing well without any complaints today.  She is fine with rescheduling her 2D echocardiogram.  We told her that it would likely be rescheduled for 2 months from now.  Please call and reschedule patient for echo in 2 months.

## 2018-07-10 ENCOUNTER — Other Ambulatory Visit (HOSPITAL_COMMUNITY): Payer: 59

## 2018-09-10 ENCOUNTER — Other Ambulatory Visit (HOSPITAL_COMMUNITY): Payer: 59

## 2018-09-14 ENCOUNTER — Other Ambulatory Visit: Payer: Self-pay | Admitting: Family Medicine

## 2018-10-14 ENCOUNTER — Telehealth (HOSPITAL_COMMUNITY): Payer: Self-pay

## 2018-10-14 NOTE — Telephone Encounter (Signed)
LMTCB COVID prescreening for echo. No echo appt details left per DPR.

## 2018-10-15 ENCOUNTER — Other Ambulatory Visit: Payer: Self-pay

## 2018-10-15 ENCOUNTER — Ambulatory Visit (HOSPITAL_COMMUNITY): Payer: 59 | Attending: Internal Medicine

## 2018-10-15 DIAGNOSIS — I421 Obstructive hypertrophic cardiomyopathy: Secondary | ICD-10-CM | POA: Diagnosis present

## 2018-11-18 ENCOUNTER — Other Ambulatory Visit: Payer: Self-pay | Admitting: *Deleted

## 2018-11-18 DIAGNOSIS — Z20822 Contact with and (suspected) exposure to covid-19: Secondary | ICD-10-CM

## 2018-11-20 ENCOUNTER — Other Ambulatory Visit: Payer: Self-pay

## 2018-11-20 DIAGNOSIS — Z20822 Contact with and (suspected) exposure to covid-19: Secondary | ICD-10-CM

## 2018-11-22 LAB — NOVEL CORONAVIRUS, NAA: SARS-CoV-2, NAA: NOT DETECTED

## 2018-12-20 ENCOUNTER — Other Ambulatory Visit: Payer: Self-pay | Admitting: Cardiology

## 2019-03-30 ENCOUNTER — Other Ambulatory Visit: Payer: Self-pay | Admitting: Family Medicine

## 2019-03-31 NOTE — Telephone Encounter (Signed)
Patient informed on refills and appt made for January.  [place] Sarin[place],CMA

## 2019-03-31 NOTE — Telephone Encounter (Signed)
Patient last seen more than a year ago. Please help her schedule follow-up with me soon. We will not give subsequent med refill without f/u.

## 2019-04-26 ENCOUNTER — Other Ambulatory Visit: Payer: Self-pay | Admitting: Family Medicine

## 2019-05-13 ENCOUNTER — Other Ambulatory Visit: Payer: Self-pay

## 2019-05-13 ENCOUNTER — Encounter: Payer: Self-pay | Admitting: Family Medicine

## 2019-05-13 ENCOUNTER — Ambulatory Visit (INDEPENDENT_AMBULATORY_CARE_PROVIDER_SITE_OTHER): Payer: 59 | Admitting: Family Medicine

## 2019-05-13 VITALS — BP 124/60 | HR 70

## 2019-05-13 DIAGNOSIS — E785 Hyperlipidemia, unspecified: Secondary | ICD-10-CM | POA: Insufficient documentation

## 2019-05-13 DIAGNOSIS — E559 Vitamin D deficiency, unspecified: Secondary | ICD-10-CM | POA: Diagnosis not present

## 2019-05-13 DIAGNOSIS — I10 Essential (primary) hypertension: Secondary | ICD-10-CM

## 2019-05-13 DIAGNOSIS — I509 Heart failure, unspecified: Secondary | ICD-10-CM | POA: Diagnosis not present

## 2019-05-13 DIAGNOSIS — I421 Obstructive hypertrophic cardiomyopathy: Secondary | ICD-10-CM | POA: Diagnosis not present

## 2019-05-13 DIAGNOSIS — N3941 Urge incontinence: Secondary | ICD-10-CM

## 2019-05-13 DIAGNOSIS — R32 Unspecified urinary incontinence: Secondary | ICD-10-CM | POA: Insufficient documentation

## 2019-05-13 MED ORDER — FUROSEMIDE 40 MG PO TABS: 40.0000 mg | ORAL_TABLET | Freq: Every day | ORAL | 1 refills | Status: DC

## 2019-05-13 MED ORDER — POTASSIUM CHLORIDE CRYS ER 20 MEQ PO TBCR: EXTENDED_RELEASE_TABLET | ORAL | 2 refills | Status: DC

## 2019-05-13 MED ORDER — METOPROLOL SUCCINATE ER 25 MG PO TB24: 25.0000 mg | ORAL_TABLET | Freq: Every day | ORAL | 1 refills | Status: DC

## 2019-05-13 MED ORDER — LISINOPRIL 20 MG PO TABS: 20.0000 mg | ORAL_TABLET | Freq: Every day | ORAL | 1 refills | Status: DC

## 2019-05-13 NOTE — Progress Notes (Signed)
  Subjective:     Patient ID: [name], female   DOB: April 14, 1961, 59 y.o.   MRN: [id number]  HPI HTN/HF/HLD: She is here for f/u. She is compliant with Lasix 40 mg QD, Lisinopril 20 mg QD, and Metoprolol XL 25 mg QD. She denies chest pain, no SOB. She is compliant with Cards f/u. HLD: She is not on meds. Here for f/u. Elevated anti-CCP: Denies joint pain or swelling. She had a Rheumatology referral in the past for this but was unable to schedule an appointment. She feels well in general. HM: She is due for PAP and a mammogram. She stated that she gets both with her OB/GYN and will schedule an appointment. She declined the flu shot today.  Urine incontinence: Started about 1 year ago. No change in symptoms. Denies any trigger by coughing or sneezing. She was evaluated by OB/Gyn and the recommended electrostimulation therapy which she is yet to schedule. She denies dysuria, or change in urine color. Vit D Def: Here for f/u. Currently on OTC Vit D supplement.  Vitals:   05/13/19 1131  BP: 124/60  Pulse: 70    Review of Systems  Respiratory: Negative.   Cardiovascular: Negative.   Gastrointestinal: Negative.   Genitourinary: Negative for dysuria.       Incontinence   Musculoskeletal: Negative.   Neurological: Negative.   All other systems reviewed and are negative.      Objective:   Physical Exam Vitals and nursing note reviewed.  Cardiovascular:     Rate and Rhythm: Normal rate and regular rhythm.     Pulses: Normal pulses.     Heart sounds: Normal heart sounds. No murmur.  Pulmonary:     Effort: Pulmonary effort is normal. No respiratory distress.     Breath sounds: Normal breath sounds. No wheezing or rhonchi.  Abdominal:     General: Abdomen is flat. Bowel sounds are normal. There is no distension.     Palpations: Abdomen is soft.     Tenderness: There is no abdominal tenderness.  Neurological:     General: No focal deficit present.     Mental Status: She is alert.   Psychiatric:        Mood and Affect: Mood normal.        Assessment:     HTN HF/Cardiomyopathy Urine incontinence Vit D def HLD    Plan:     Check problem list.  Elevated anti CCP: Discussed referral to rheumatologist vs watch for now since she is asymptomatic. She prefers to monitor. She declined flu shot. She will schedule mammogram and PAP with her gynecologist.

## 2019-05-13 NOTE — Assessment & Plan Note (Signed)
Likely urge incontinence. Kegel exercise discussed which she is already doing. F?U with Gyn as planned.

## 2019-05-13 NOTE — Patient Instructions (Signed)
It was nice seeing you today. Your BP looks great. We will get some lab work done today and call you soon about your test results. Please continue to exercise as tolerated and adjust diet as needed for weight loss. I will like to see you back in about 3-6 months.

## 2019-05-13 NOTE — Assessment & Plan Note (Signed)
Mild LDL elevation. Not on Statin. Repeat FLP today. I will contact her with the result.

## 2019-05-13 NOTE — Assessment & Plan Note (Signed)
No acute change. Good Cardiology f/u. Continue current regimen. Bmet checked today.

## 2019-05-13 NOTE — Assessment & Plan Note (Signed)
BP well controlled on her current regimen. Refill given.

## 2019-05-13 NOTE — Assessment & Plan Note (Signed)
Lab rechecked today.

## 2019-05-14 ENCOUNTER — Telehealth: Payer: Self-pay | Admitting: Family Medicine

## 2019-05-14 LAB — BASIC METABOLIC PANEL
BUN/Creatinine Ratio: 10 (ref 9–23)
BUN: 8 mg/dL (ref 6–24)
CO2: 22 mmol/L (ref 20–29)
Calcium: 9.4 mg/dL (ref 8.7–10.2)
Chloride: 103 mmol/L (ref 96–106)
Creatinine, Ser: 0.83 mg/dL (ref 0.57–1.00)
GFR calc Af Amer: 91 mL/min/{1.73_m2} (ref >59)
GFR calc non Af Amer: 79 mL/min/{1.73_m2} (ref >59)
Glucose: 78 mg/dL (ref 65–99)
Potassium: 4.4 mmol/L (ref 3.5–5.2)
Sodium: 138 mmol/L (ref 134–144)

## 2019-05-14 LAB — LIPID PANEL
Chol/HDL Ratio: 4.5 ratio — ABNORMAL HIGH (ref 0.0–4.4)
Cholesterol, Total: 148 mg/dL (ref 100–199)
HDL: 33 mg/dL — ABNORMAL LOW (ref >39)
LDL Chol Calc (NIH): 84 mg/dL (ref 0–99)
Triglycerides: 177 mg/dL — ABNORMAL HIGH (ref 0–149)
VLDL Cholesterol Cal: 31 mg/dL (ref 5–40)

## 2019-05-14 LAB — VITAMIN D 25 HYDROXY (VIT D DEFICIENCY, FRACTURES): Vit D, 25-Hydroxy: 30.7 ng/mL (ref 30.0–100.0)

## 2019-05-14 NOTE — Telephone Encounter (Signed)
No need for therapy and patient with HOCM. Agree with diet. Kirk Ruths

## 2019-05-14 NOTE — Telephone Encounter (Signed)
I called and discussed lab results with her.  An ASCVD risk score of 5.5% Based on her PMX, she is not in the Statin benefit group (no DM, MI, CVA).  I am uncertain about her history of Hypertrophic Cardiomyopathy. I reviewed her Cardiologist's note, and there was no recommendation regarding Statin therapy.  As discussed with her, I will message Dr. Stanford Breed regarding his recommendation based on her diagnosis of [place].  She is in the borderline high triglyceride group. I discussed exercise and diet changes for weight loss. I also recommended Fish oil 2 g BID. She said she had done fish oil before and will be willing to get back on it.  Lipids:    Component Value Date/Time   CHOL 148 05/13/2019 1340   TRIG 177 (H) 05/13/2019 1340   HDL 33 (L) 05/13/2019 1340   VLDL 19 04/19/2014 0725   CHOLHDL 4.5 (H) 05/13/2019 1340   CHOLHDL 4.8 04/19/2014 0725

## 2019-06-22 ENCOUNTER — Other Ambulatory Visit: Payer: Self-pay | Admitting: Cardiology

## 2019-06-25 ENCOUNTER — Telehealth: Payer: Self-pay | Admitting: Cardiology

## 2019-06-25 NOTE — Telephone Encounter (Signed)
We are recommending the COVID-19 vaccine to all of our patients. Cardiac medications (including blood thinners) should not deter anyone from being vaccinated and there is no need to hold any of those medications prior to vaccine administration.     Currently, there is a hotline to call (active 05/02/19) to schedule vaccination appointments as no walk-ins will be accepted.   Number: [phone].    If an appointment is not available please go to [email or web address] to sign up for notification when additional vaccine appointments are available.   If you have further questions or concerns about the vaccine process, please visit [email or web address] or contact your primary care physician.   

## 2019-07-09 NOTE — Progress Notes (Signed)
Virtual Visit via Video Note changed to phone visit at patient request   This visit type was conducted due to national recommendations for restrictions regarding the COVID-19 Pandemic (e.g. social distancing) in an effort to limit this patient's exposure and mitigate transmission in our community.  Due to her co-morbid illnesses, this patient is at least at moderate risk for complications without adequate follow up.  This format is felt to be most appropriate for this patient at this time.  All issues noted in this document were discussed and addressed.  A limited physical exam was performed with this format.  Please refer to the patient's chart for her consent to telehealth for [facility].   Date:  07/22/2019   ID:  [name], DOB Nov 28, 1960, MRN [id number]  Patient Location:Home Provider Location: Home  PCP:  Kinnie Feil, MD  Cardiologist:  Dr Stanford Breed  Evaluation Performed:  Follow-Up Visit  Chief Complaint:  FU HCM  History of Present Illness:    Followup apical hypertrophic cardiomyopathy. Apreviousexercise treadmill revealed no decrease in systolic blood pressure with exercise. Cardiac catheterization following abnormal nuclear study 12/15 revealed apical variant hypertrophic cardiomyopathy and normal coronary arteries. Also with history of diastolic congestive heart failure. Holter monitor October 2016 showed no nonsustained ventricular tachycardia.Last echocardiogram June 2020 showed hyperdynamic LV function, severe left ventricular hypertrophy, grade 1 diastolic dysfunction, moderate biatrial enlargement and mildly dilated aortic root.  Since she was last seen,the patient has dyspnea with more extreme activities but not with routine activities. It is relieved with rest. It is not associated with chest pain. There is no orthopnea, PND or pedal edema. There is no syncope or palpitations. There is no exertional chest pain.   The patient does not have symptoms  concerning for COVID-19 infection (fever, chills, cough, or new shortness of breath).    Past Medical History:  Diagnosis Date  . Abnormal nuclear cardiac imaging test 04/22/2014  . Anemia   . Chest pain    cath 7/09: normal cors; EF 65%  . Diastolic congestive heart failure ([place])   . Dyspepsia and other specified disorders of function of stomach 07/04/2013  . Ganglion cyst 01/24/2014  . GERD (gastroesophageal reflux disease)   . HOCM (hypertrophic obstructive cardiomyopathy) ([place])    echo 10/11: normal LVF; apical hypertropy; mod LAE; mild MR;      Holter 10/11: no NSVT;     ETT 10/11: no decreased BP with exercise   . HTN (hypertension)   . Hyperlipidemia   . 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 SYNCOPE 01/28/2010   Qualifier: Diagnosis of  By: Stanford Breed, MD, Kandyce Rud    Past Surgical History:  Procedure Laterality Date  . CARDIAC CATHETERIZATION    . LEFT HEART CATHETERIZATION WITH CORONARY/GRAFT ANGIOGRAM N/A 04/21/2014   Procedure: LEFT HEART CATHETERIZATION WITH Beatrix Fetters;  Surgeon: Troy Sine, MD;  Location: [facility] CATH LAB;  Service: Cardiovascular;  Laterality: N/A;  . TUBAL LIGATION       Current Meds  Medication Sig  . aspirin 81 MG EC tablet Take 81 mg by mouth daily.    . Cholecalciferol (VITAMIN D) 50 MCG (2000 UT) tablet Take 2,000 Units by mouth daily.  . furosemide (LASIX) 40 MG tablet Take 1 tablet (40 mg total) by mouth daily.  Marland Kitchen lisinopril (ZESTRIL) 20 MG tablet Take 1 tablet (20 mg total) by mouth daily.  . metoprolol succinate (TOPROL-XL) 25 MG 24 hr tablet Take 1 tablet (25 mg total) by mouth  daily.  . Omega-3 Fatty Acids (FISH OIL PO) Take 1 tablet by mouth daily.  . potassium chloride SA (KLOR-CON M20) 20 MEQ tablet TAKE 1 TABLET (20 MEQ TOTAL) BY MOUTH DAILY.     Allergies:   Codeine phosphate   Social History   Tobacco Use  . Smoking status: Never Smoker  . Smokeless tobacco: Never Used  Substance Use Topics  .  Alcohol use: No  . Drug use: No     Family Hx: The patient's family history includes Alcohol abuse in her father; Asthma in her brother; Cervical cancer in her maternal grandmother; Cirrhosis in her father.  ROS:   Please see the history of present illness.    No Fever, chills  or productive cough All other systems reviewed and are negative.   Recent Labs: 05/13/2019: BUN 8; Creatinine, Ser 0.83; Potassium 4.4; Sodium 138   Recent Lipid Panel Lab Results  Component Value Date/Time   CHOL 148 05/13/2019 01:40 PM   TRIG 177 (H) 05/13/2019 01:40 PM   HDL 33 (L) 05/13/2019 01:40 PM   CHOLHDL 4.5 (H) 05/13/2019 01:40 PM   CHOLHDL 4.8 04/19/2014 07:25 AM   LDLCALC 84 05/13/2019 01:40 PM    Wt Readings from Last 3 Encounters:  07/22/19 189 lb (85.7 kg)  06/25/18 182 lb (82.6 kg)  10/30/17 211 lb (95.7 kg)     Objective:    Vital Signs:  Ht 5\' 5"  (1.651 m)   Wt 189 lb (85.7 kg)   BMI 31.45 kg/m    VITAL SIGNS:  reviewed NAD Answers questions appropriately Normal affect Remainder of physical examination not performed (telehealth visit; coronavirus pandemic)  ASSESSMENT & PLAN:    1. Apical variant hypertrophic cardiomyopathy-continue beta-blocker.  Patient again instructed on the importance of having her children screened.  Schedule cardiac MRI for risk stratification for sudden death.  I will place 24-hour Holter monitor to screen for nonsustained ventricular tachycardia. 2. Hypertension-Continue present medical regimen. 3. Chronic diastolic congestive heart failure-euvolemic based on history.  Continue Lasix at present dose.  Check potassium and renal function.  Continue fluid restriction and low-sodium diet.  COVID-19 Education: The importance of social distancing was discussed today.  Time:   Today, I have spent 16 minutes with the patient with telehealth technology discussing the above problems.     Medication Adjustments/Labs and Tests Ordered: Current  medicines are reviewed at length with the patient today.  Concerns regarding medicines are outlined above.   Tests Ordered: No orders of the defined types were placed in this encounter.   Medication Changes: No orders of the defined types were placed in this encounter.   Follow Up:  Either In Person or Virtual in 1 year(s)  Signed, Kirk Ruths, MD  07/22/2019 8:08 AM    [place]

## 2019-07-22 ENCOUNTER — Encounter: Payer: Self-pay | Admitting: Cardiology

## 2019-07-22 ENCOUNTER — Encounter: Payer: Self-pay | Admitting: *Deleted

## 2019-07-22 ENCOUNTER — Telehealth (INDEPENDENT_AMBULATORY_CARE_PROVIDER_SITE_OTHER): Payer: 59 | Admitting: Cardiology

## 2019-07-22 VITALS — Ht 65.0 in | Wt 189.0 lb

## 2019-07-22 DIAGNOSIS — I5032 Chronic diastolic (congestive) heart failure: Secondary | ICD-10-CM

## 2019-07-22 DIAGNOSIS — I110 Hypertensive heart disease with heart failure: Secondary | ICD-10-CM

## 2019-07-22 DIAGNOSIS — Z7189 Other specified counseling: Secondary | ICD-10-CM | POA: Diagnosis not present

## 2019-07-22 DIAGNOSIS — I421 Obstructive hypertrophic cardiomyopathy: Secondary | ICD-10-CM | POA: Diagnosis not present

## 2019-07-22 DIAGNOSIS — I10 Essential (primary) hypertension: Secondary | ICD-10-CM

## 2019-07-22 DIAGNOSIS — I509 Heart failure, unspecified: Secondary | ICD-10-CM | POA: Diagnosis not present

## 2019-07-22 NOTE — Progress Notes (Signed)
Patient ID: [name], female   DOB: 02/06/1961, 59 y.o.   MRN: [id number] Patient enrolled for [employer] to mail a 3 day ZIO XT long term holter monitor to her home.  Instructions sent to patient via My Chart message, and will be included in her monitor kit as well.

## 2019-07-22 NOTE — Patient Instructions (Signed)
Medication Instructions:  NO CHANGE *If you need a refill on your cardiac medications before your next appointment, please call your pharmacy*   Lab Work: If you have labs (blood work) drawn today and your tests are completely normal, you will receive your results only by: Marland Kitchen MyChart Message (if you have MyChart) OR . A paper copy in the mail If you have any lab test that is abnormal or we need to change your treatment, we will call you to review the results.   Testing/Procedures: Your physician has requested that you have a cardiac MRI. Cardiac MRI uses a computer to create images of your heart as its beating, producing both still and moving pictures of your heart and major blood vessels. For further information please visit [email or web address] Please follow the instruction sheet given to you today for more information.AT [facility]  Your physician has recommended that you wear a 24 HOUR holter monitor. Holter monitors are medical devices that record the heart's electrical activity. Doctors most often use these monitors to diagnose arrhythmias. Arrhythmias are problems with the speed or rhythm of the heartbeat. The monitor is a small, portable device. You can wear one while you do your normal daily activities. This is usually used to diagnose what is causing palpitations/syncope (passing out).WILL BE MAILED TO YOUR HOME    Follow-Up: At [facility], you and your health needs are our priority.  As part of our continuing mission to provide you with exceptional heart care, we have created designated Provider Care Teams.  These Care Teams include your primary Cardiologist (physician) and Advanced Practice Providers (APPs -  Physician Assistants and Nurse Practitioners) who all work together to provide you with the care you need, when you need it.  We recommend signing up for the patient portal called "MyChart".  Sign up information is provided on this After Visit Summary.  MyChart is  used to connect with patients for Virtual Visits (Telemedicine).  Patients are able to view lab/test results, encounter notes, upcoming appointments, etc.  Non-urgent messages can be sent to your provider as well.   To learn more about what you can do with MyChart, go to [email or web address].    Your next appointment:   12 month(s)  The format for your next appointment:   Either In Person or Virtual  Provider:   You may see Kirk Ruths, MD or one of the following Advanced Practice Providers on your designated Care Team:    Kerin Ransom, PA-C  [place], [place]  Coletta Memos, [place]

## 2019-07-25 ENCOUNTER — Telehealth: Payer: Self-pay | Admitting: Cardiology

## 2019-07-25 NOTE — Telephone Encounter (Signed)
Left message 07/23/19--07/24/19 and 07/25/19 for patient to call concerning scheduling preferences for Cardiac MRI ordered by Dr. Stanford Breed

## 2019-07-25 NOTE — Telephone Encounter (Signed)
Barry called returning Louie Bun call to scheduled her MRI. I called Kay's ext and had Landi leave a message to be called back.

## 2019-07-27 ENCOUNTER — Ambulatory Visit (INDEPENDENT_AMBULATORY_CARE_PROVIDER_SITE_OTHER): Payer: 59

## 2019-07-27 DIAGNOSIS — I421 Obstructive hypertrophic cardiomyopathy: Secondary | ICD-10-CM

## 2019-07-27 DIAGNOSIS — I493 Ventricular premature depolarization: Secondary | ICD-10-CM

## 2019-08-06 ENCOUNTER — Telehealth: Payer: Self-pay | Admitting: *Deleted

## 2019-08-06 ENCOUNTER — Encounter: Payer: Self-pay | Admitting: *Deleted

## 2019-08-06 NOTE — Telephone Encounter (Signed)
Left message regarding appointment for Cardiac MRI scheduled 09/09/19 at 12:00pm---arrival time is 11:15 am 1st floor radiology.  Will mail information to patient and it is also available in My Chart

## 2019-08-14 ENCOUNTER — Other Ambulatory Visit: Payer: Self-pay | Admitting: Cardiology

## 2019-08-14 DIAGNOSIS — I421 Obstructive hypertrophic cardiomyopathy: Secondary | ICD-10-CM

## 2019-08-14 DIAGNOSIS — I493 Ventricular premature depolarization: Secondary | ICD-10-CM

## 2019-08-17 ENCOUNTER — Other Ambulatory Visit: Payer: Self-pay | Admitting: Cardiology

## 2019-08-26 ENCOUNTER — Other Ambulatory Visit: Payer: Self-pay | Admitting: Family Medicine

## 2019-09-09 ENCOUNTER — Ambulatory Visit (HOSPITAL_COMMUNITY): Admission: RE | Admit: 2019-09-09 | Payer: 59 | Source: Ambulatory Visit

## 2019-10-07 ENCOUNTER — Telehealth (HOSPITAL_COMMUNITY): Payer: Self-pay | Admitting: *Deleted

## 2019-10-07 NOTE — Telephone Encounter (Signed)
Attempted to call patient regarding upcoming cardiac MRI appointment. Left message on voicemail with name and callback number  [place] RN Navigator Cardiac [facility] [phone] Office [phone] Cell

## 2019-10-08 ENCOUNTER — Telehealth (HOSPITAL_COMMUNITY): Payer: Self-pay | Admitting: *Deleted

## 2019-10-08 NOTE — Telephone Encounter (Signed)
Reaching out to patient to offer assistance regarding upcoming cardiac imaging study; pt verbalizes understanding of appt date/time, parking situation and where to check in, and verified current allergies; name and call back number provided for further questions should they arise  [place] [facility] [phone] office [phone] cell

## 2019-10-09 ENCOUNTER — Ambulatory Visit (HOSPITAL_COMMUNITY): Admission: RE | Admit: 2019-10-09 | Payer: 59 | Source: Ambulatory Visit

## 2019-10-13 ENCOUNTER — Telehealth: Payer: Self-pay | Admitting: Cardiology

## 2019-10-13 NOTE — Telephone Encounter (Signed)
Left message for patient to call to discuss rescheduling the Cardiac MRI ordered by Dr.Crenshaw

## 2019-10-20 NOTE — Telephone Encounter (Signed)
Left message for patient to call to discuss rescheduling Cardiac MRI ordered by Dr. Dalbert Garnet

## 2019-10-24 NOTE — Telephone Encounter (Signed)
Spoke with patient---she is on vacation in [place] and will call back when she returns in a couple of weeks to reschedule

## 2019-11-10 NOTE — Telephone Encounter (Signed)
Left message for patient to call and discuss rescheduling Cardiac MRI ordered by Dr. Stanford Breed

## 2019-11-12 ENCOUNTER — Encounter: Payer: Self-pay | Admitting: Cardiology

## 2019-11-12 ENCOUNTER — Telehealth: Payer: Self-pay | Admitting: *Deleted

## 2019-11-12 NOTE — Telephone Encounter (Signed)
Spoke with patient regarding appointment for Cardiac MRI scheduled Wednesday 12/24/19 at 8:00 am at [facility]---arrival time is 7:30 am 1st floor admissions office.  Will mail information to patient---she voiced her understanding.

## 2019-12-08 ENCOUNTER — Other Ambulatory Visit: Payer: Self-pay | Admitting: Family Medicine

## 2019-12-14 ENCOUNTER — Other Ambulatory Visit: Payer: Self-pay | Admitting: Family Medicine

## 2019-12-16 ENCOUNTER — Ambulatory Visit: Payer: 59 | Admitting: Family Medicine

## 2019-12-23 ENCOUNTER — Telehealth (HOSPITAL_COMMUNITY): Payer: Self-pay | Admitting: *Deleted

## 2019-12-23 NOTE — Telephone Encounter (Signed)
Reaching out to patient to offer assistance regarding upcoming cardiac imaging study; pt verbalizes understanding of appt date/time, parking situation and where to check in; name and call back number provided for further questions should they arise  [place]avea Woodrow Tai RN Navigator Cardiac Imaging [facility] [phone] office [phone] cell  

## 2019-12-23 NOTE — Telephone Encounter (Signed)
Attempted to call patient regarding upcoming cardiac MRI appointment. Left message on voicemail with name and callback number  Dutch Ing Tai RN Navigator Cardiac Imaging [facility] [phone] Office [phone] Cell  

## 2019-12-24 ENCOUNTER — Other Ambulatory Visit: Payer: Self-pay

## 2019-12-24 ENCOUNTER — Ambulatory Visit (HOSPITAL_COMMUNITY)
Admission: RE | Admit: 2019-12-24 | Discharge: 2019-12-24 | Disposition: A | Discharge status: Home / Self Care | Payer: 59 | Source: Ambulatory Visit | Attending: Cardiology | Admitting: Cardiology

## 2019-12-24 DIAGNOSIS — I421 Obstructive hypertrophic cardiomyopathy: Secondary | ICD-10-CM | POA: Insufficient documentation

## 2019-12-24 MED ORDER — GADOBUTROL 1 MMOL/ML IV SOLN
10.0000 mL | Freq: Once | INTRAVENOUS | Status: AC | PRN
  Administered 2019-12-24: 10 mL via INTRAVENOUS

## 2020-02-18 ENCOUNTER — Other Ambulatory Visit: Payer: Self-pay | Admitting: Family Medicine

## 2020-02-18 ENCOUNTER — Other Ambulatory Visit: Payer: Self-pay | Admitting: Cardiology

## 2020-02-18 ENCOUNTER — Encounter: Payer: Self-pay | Admitting: Family Medicine

## 2020-02-18 ENCOUNTER — Telehealth: Payer: Self-pay | Admitting: Family Medicine

## 2020-02-18 NOTE — Telephone Encounter (Signed)
Need flu shot appointment.  HIPAA compliant message left.  Please help her schedule flu shot appointment with the RN should she return this call. Thanks.

## 2020-04-12 ENCOUNTER — Encounter: Payer: Self-pay | Admitting: Family Medicine

## 2020-06-04 ENCOUNTER — Other Ambulatory Visit: Payer: Self-pay | Admitting: Family Medicine

## 2020-06-04 NOTE — Telephone Encounter (Signed)
Patient had not been evaluated in over a year. Need appointment for future refills

## 2020-07-14 ENCOUNTER — Telehealth: Payer: Self-pay | Admitting: Cardiology

## 2020-07-14 NOTE — Telephone Encounter (Signed)
3.23.22 LVM on cell phone to schedule 1 yr fu w/Dr Stanford Breed. LP

## 2020-08-23 ENCOUNTER — Other Ambulatory Visit: Payer: Self-pay | Admitting: Family Medicine

## 2020-09-16 ENCOUNTER — Telehealth: Payer: Self-pay | Admitting: Cardiology

## 2020-09-16 ENCOUNTER — Other Ambulatory Visit: Payer: Self-pay

## 2020-09-16 MED ORDER — LISINOPRIL 20 MG PO TABS: 1.0000 | ORAL_TABLET | Freq: Every day | ORAL | 0 refills | Status: DC

## 2020-09-16 NOTE — Telephone Encounter (Signed)
*  STAT* If patient is at the pharmacy, call can be transferred to refill team.   1. Which medications need to be refilled? (please list name of each medication and dose if known) metoprolol succinate (TOPROL-XL) 25 MG 24 hr tablet  2. Which pharmacy/location (including street and city if local pharmacy) is medication to be sent to? [id number] - [place], [place] - [address]  3. Do they need a 30 day or 90 day supply? 90  Patient has an appt on 8/31 please allow to fill prescription

## 2020-09-16 NOTE — Telephone Encounter (Signed)
Patient calls office requesting refill on lisinopril. Patient has scheduled annual visit for 10/26/20.   Please advise if BP medication can be sent in to last patient until appointment.   Talbot Grumbling, RN

## 2020-10-26 ENCOUNTER — Ambulatory Visit: Payer: 59 | Admitting: Family Medicine

## 2020-11-01 ENCOUNTER — Encounter: Payer: Self-pay | Admitting: Family Medicine

## 2020-11-02 ENCOUNTER — Encounter: Payer: Self-pay | Admitting: Family Medicine

## 2020-11-02 ENCOUNTER — Other Ambulatory Visit: Payer: Self-pay

## 2020-11-02 ENCOUNTER — Ambulatory Visit: Payer: 59 | Admitting: Family Medicine

## 2020-11-02 VITALS — BP 110/64 | HR 65 | Ht 65.0 in | Wt 207.0 lb

## 2020-11-02 DIAGNOSIS — R232 Flushing: Secondary | ICD-10-CM

## 2020-11-02 DIAGNOSIS — I10 Essential (primary) hypertension: Secondary | ICD-10-CM | POA: Diagnosis not present

## 2020-11-02 DIAGNOSIS — I421 Obstructive hypertrophic cardiomyopathy: Secondary | ICD-10-CM

## 2020-11-02 DIAGNOSIS — E785 Hyperlipidemia, unspecified: Secondary | ICD-10-CM

## 2020-11-02 DIAGNOSIS — I509 Heart failure, unspecified: Secondary | ICD-10-CM | POA: Diagnosis not present

## 2020-11-02 DIAGNOSIS — R7303 Prediabetes: Secondary | ICD-10-CM | POA: Diagnosis not present

## 2020-11-02 DIAGNOSIS — N3941 Urge incontinence: Secondary | ICD-10-CM

## 2020-11-02 LAB — POCT GLYCOSYLATED HEMOGLOBIN (HGB A1C): Hemoglobin A1C: 5.9 % — AB (ref 4.0–5.6)

## 2020-11-02 MED ORDER — GABAPENTIN 100 MG PO CAPS: 100.0000 mg | ORAL_CAPSULE | Freq: Two times a day (BID) | ORAL | 1 refills | Status: DC

## 2020-11-02 MED ORDER — METOPROLOL SUCCINATE ER 25 MG PO TB24: 12.5000 mg | ORAL_TABLET | Freq: Every day | ORAL | 1 refills | Status: DC

## 2020-11-02 MED ORDER — LISINOPRIL 10 MG PO TABS: 10.0000 mg | ORAL_TABLET | Freq: Every day | ORAL | 1 refills | Status: DC

## 2020-11-02 NOTE — Assessment & Plan Note (Addendum)
She had managed this conservatively with no improvement. I discussed low dose Gabapentin - 100 mg BID for a start. She is willing to trial this. We will adjust dose as needed.

## 2020-11-02 NOTE — Patient Instructions (Signed)
https://www.womenshealth.gov/menopause/menopause-basics"> https://www.clinicalkey.com">  Menopause Menopause is the normal time of a woman's life when menstrual periods stop completely. It marks the natural end to a woman's ability to become pregnant. It can be defined as the absence of a menstrual period for 12 months without another medical cause. The transition to menopause (perimenopause) most often happens between the ages of 45 and 55, and can last for many years. During perimenopause, hormone levels change in your body, which can cause symptoms and affect your health. Menopause may increase your risk for: Weakened bones (osteoporosis), which causes fractures. Depression. Hardening and narrowing of the arteries (atherosclerosis), which can cause heart attacks and strokes. What are the causes? This condition is usually caused by a natural change in hormone levels that happens as you get older. The condition may also be caused by changes that are not natural, including: Surgery to remove both ovaries (surgical menopause). Side effects from some medicines, such as chemotherapy used to treat cancer (chemical menopause). What increases the risk? This condition is more likely to start at an earlier age if you have certain medical conditions or have undergone treatments, including: A tumor of the pituitary gland in the brain. A disease that affects the ovaries and hormones. Certain cancer treatments, such as chemotherapy or hormone therapy, or radiation therapy on the pelvis. Heavy smoking and excessive alcohol use. Family history of early menopause. This condition is also more likely to develop earlier in women who are verythin. What are the signs or symptoms? Symptoms of this condition include: Hot flashes. Irregular menstrual periods. Night sweats. Changes in feelings about sex. This could be a decrease in sex drive or an increased discomfort around your sexuality. Vaginal dryness and  thinning of the vaginal walls. This may cause painful sex. Dryness of the skin and development of wrinkles. Headaches. Problems sleeping (insomnia). Mood swings or irritability. Memory problems. Weight gain. Hair growth on the face and chest. Bladder infections or problems with urinating. How is this diagnosed? This condition is diagnosed based on your medical history, a physical exam, your age, your menstrual history, and your symptoms. Hormone tests may also bedone. How is this treated? In some cases, no treatment is needed. You and your health care provider should make a decision together about whether treatment is necessary. Treatment will be based on your individual condition and preferences. Treatment for this condition focuses on managing symptoms. Treatment may include: Menopausal hormone therapy (MHT). Medicines to treat specific symptoms or complications. Acupuncture. Vitamin or herbal supplements. Before starting treatment, make sure to let your health care provider know if you have a personal or family history of these conditions: Heart disease. Breast cancer. Blood clots. Diabetes. Osteoporosis. Follow these instructions at home: Lifestyle Do not use any products that contain nicotine or tobacco, such as cigarettes, e-cigarettes, and chewing tobacco. If you need help quitting, ask your health care provider. Get at least 30 minutes of physical activity on 5 or more days each week. Avoid alcoholic and caffeinated beverages, as well as spicy foods. This may help prevent hot flashes. Get 7-8 hours of sleep each night. If you have hot flashes, try: Dressing in layers. Avoiding things that may trigger hot flashes, such as spicy food, warm places, or stress. Taking slow, deep breaths when a hot flash starts. Keeping a fan in your home and office. Find ways to manage stress, such as deep breathing, meditation, or journaling. Consider going to group therapy with other women who  are having menopause symptoms. Ask your   health care provider about recommended group therapy meetings. Eating and drinking  Eat a healthy, balanced diet that contains whole grains, lean protein, low-fat dairy, and plenty of fruits and vegetables. Your health care provider may recommend adding more soy to your diet. Foods that contain soy include tofu, tempeh, and soy milk. Eat plenty of foods that contain calcium and vitamin D for bone health. Items that are rich in calcium include low-fat milk, yogurt, beans, almonds, sardines, broccoli, and kale.  Medicines Take over-the-counter and prescription medicines only as told by your health care provider. Talk with your health care provider before starting any herbal supplements. If prescribed, take vitamins and supplements as told by your health care provider. General instructions  Keep track of your menstrual periods, including: When they occur. How heavy they are and how long they last. How much time passes between periods. Keep track of your symptoms, noting when they start, how often you have them, and how long they last. Use vaginal lubricants or moisturizers to help with vaginal dryness and improve comfort during sex. Keep all follow-up visits. This is important. This includes any group therapy or counseling.  Contact a health care provider if: You are still having menstrual periods after age 55. You have pain during sex. You have not had a period for 12 months and you develop vaginal bleeding. Get help right away if you have: Severe depression. Excessive vaginal bleeding. Pain when you urinate. A fast or irregular heartbeat (palpitations). Severe headaches. Abdominal pain or severe indigestion. Summary Menopause is a normal time of life when menstrual periods stop completely. It is usually defined as the absence of a menstrual period for 12 months without another medical cause. The transition to menopause (perimenopause) most often  happens between the ages of 45 and 55 and can last for several years. Symptoms can be managed through medicines, lifestyle changes, and complementary therapies such as acupuncture. Eat a balanced diet that is rich in nutrients to promote bone health and heart health and to manage symptoms during menopause. This information is not intended to replace advice given to you by your health care provider. Make sure you discuss any questions you have with your healthcare provider. Document Revised: 01/09/2020 Document Reviewed: 09/25/2019 Elsevier Patient Education  2022 Elsevier Inc.  

## 2020-11-02 NOTE — Assessment & Plan Note (Signed)
Stable, see above A/P.

## 2020-11-02 NOTE — Progress Notes (Signed)
    SUBJECTIVE:   CHIEF COMPLAINT / HPI:   HCOM/CHF/HTN: She denies chest pain. She endorses occasional breathlessness, but pretty infrequent. She needs a refill of her Metoprolol XL 25 mg QD. She has been out of this med for a month or more. Her home systolic BP while off BB was 245Y systolic, with her HR in the 60s. She is also on Lasix 40 mg QD and Lisinopril 20 mg qd.  Hot flash: Worsening over the past 2 yrs. She gets drenched in her sweat daily. She will like to start a pharmacologic agent for this.  Incontinence: C/O inability to hold her urine before she gets to the bathroom. Ongoing x 1 yrs. She denies dysuria or other urinary symptoms.  HLD/PreDM:Compliant with fish oil. A1C checked today for PreDM.   HM:She mention she already received her [place] dose. She will plan on calling her Gyn's office to schedule her PAP and mammogram.  PERTINENT  PMH / PSH: PMX reviewed.  OBJECTIVE:   Vitals:   11/02/20 1117 11/02/20 1145  BP: 129/61 110/64  Pulse: 66 65  SpO2: 100% 98%  Weight: 207 lb (93.9 kg)   Height: 5\' 5"  (1.651 m)   BMI 34.45 kg/m    Physical Exam Vitals and nursing note reviewed.  Cardiovascular:     Rate and Rhythm: Normal rate and regular rhythm.     Heart sounds: Normal heart sounds. No murmur heard. Pulmonary:     Effort: Pulmonary effort is normal. No respiratory distress.     Breath sounds: Normal breath sounds. No wheezing.  Abdominal:     General: Bowel sounds are normal. There is no distension.     Palpations: Abdomen is soft.     Tenderness: There is no abdominal tenderness.  Musculoskeletal:     Right lower leg: No edema.     Left lower leg: No edema.     ASSESSMENT/PLAN:   Hypertrophic obstructive cardiomyopathy Stable. HR is in the 09X, and her systolic BP is in the 833A, despite being off Metoprolol for 1 month. I discussed reducing Lisinopril to 10 mg QD from 20 mg and reducing Metoprolol to 12.5 mg daily. Bmet checked  today. She is to continue home HR and BP monitoring and discuss med adjustment with Cards at her next appointment with them. Her cardiology appointment is scheduled for the end of the month.  Congestive heart failure (HCC) Stable, see above A/P.  Essential hypertension Stable, see above A/P.  Prediabetes A1C checked today. Still in the PreDM range. Diet and exercise counseling provided.  Hot flashes She had managed this conservatively with no improvement. I discussed low dose Gabapentin - 100 mg BID for a start. She is willing to trial this. We will adjust dose as needed.  Hyperlipidemia Lipid panel checked.  Urinary incontinence Urge incontinence - Worsening. I am hesitant to add any medication on to her regime at this point, especially since I added on Gabapentin. Monitor for now and readdress in the future.    Schedule PAP and mammogram with Gyn. Update COVID-19 vaccination status.  Andrena Mews, MD [place]

## 2020-11-02 NOTE — Assessment & Plan Note (Signed)
Lipid panel checked.

## 2020-11-02 NOTE — Assessment & Plan Note (Signed)
A1C checked today. Still in the PreDM range. Diet and exercise counseling provided.

## 2020-11-02 NOTE — Assessment & Plan Note (Addendum)
Stable. HR is in the 64P, and her systolic BP is in the 329J, despite being off Metoprolol for 1 month. I discussed reducing Lisinopril to 10 mg QD from 20 mg and reducing Metoprolol to 12.5 mg daily. Bmet checked today. She is to continue home HR and BP monitoring and discuss med adjustment with Cards at her next appointment with them. Her cardiology appointment is scheduled for the end of the month.

## 2020-11-02 NOTE — Assessment & Plan Note (Signed)
Urge incontinence - Worsening. I am hesitant to add any medication on to her regime at this point, especially since I added on Gabapentin. Monitor for now and readdress in the future.

## 2020-11-04 ENCOUNTER — Telehealth: Payer: Self-pay | Admitting: Family Medicine

## 2020-11-04 LAB — LIPID PANEL
Chol/HDL Ratio: 6.9 ratio — ABNORMAL HIGH (ref 0.0–4.4)
Cholesterol, Total: 192 mg/dL (ref 100–199)
HDL: 28 mg/dL — ABNORMAL LOW (ref >39)
LDL Chol Calc (NIH): 119 mg/dL — ABNORMAL HIGH (ref 0–99)
Triglycerides: 252 mg/dL — ABNORMAL HIGH (ref 0–149)
VLDL Cholesterol Cal: 45 mg/dL — ABNORMAL HIGH (ref 5–40)

## 2020-11-04 LAB — BASIC METABOLIC PANEL
BUN/Creatinine Ratio: 12 (ref 9–23)
BUN: 11 mg/dL (ref 6–24)
CO2: 18 mmol/L — ABNORMAL LOW (ref 20–29)
Calcium: 9.3 mg/dL (ref 8.7–10.2)
Chloride: 105 mmol/L (ref 96–106)
Creatinine, Ser: 0.95 mg/dL (ref 0.57–1.00)
Glucose: 86 mg/dL (ref 65–99)
Potassium: 4.3 mmol/L (ref 3.5–5.2)
Sodium: 140 mmol/L (ref 134–144)
eGFR: 69 mL/min/{1.73_m2} (ref >59)

## 2020-11-04 NOTE — Telephone Encounter (Signed)
Test result discussed.  Lipid panel worsened from her last.  Her risk for cardiovascular event is intermediate: 9% I discussed initiating moderate intensity statins vs lifestyle modification. She opted for the later. She will also discuss this with her cardiologist. Repeat lab in 6 months.

## 2020-11-25 ENCOUNTER — Other Ambulatory Visit: Payer: Self-pay | Admitting: Family Medicine

## 2020-11-30 ENCOUNTER — Other Ambulatory Visit: Payer: Self-pay | Admitting: Family Medicine

## 2020-12-01 ENCOUNTER — Other Ambulatory Visit: Payer: Self-pay | Admitting: Family Medicine

## 2020-12-13 NOTE — Progress Notes (Signed)
Error

## 2020-12-22 ENCOUNTER — Ambulatory Visit (INDEPENDENT_AMBULATORY_CARE_PROVIDER_SITE_OTHER): Payer: 59 | Admitting: Cardiology

## 2020-12-22 ENCOUNTER — Encounter: Payer: Self-pay | Admitting: Cardiology

## 2020-12-22 ENCOUNTER — Other Ambulatory Visit: Payer: Self-pay

## 2020-12-22 ENCOUNTER — Telehealth: Payer: 59 | Admitting: Cardiology

## 2020-12-22 VITALS — BP 128/72 | HR 58 | Ht 65.0 in | Wt 202.2 lb

## 2020-12-22 DIAGNOSIS — I421 Obstructive hypertrophic cardiomyopathy: Secondary | ICD-10-CM

## 2020-12-22 DIAGNOSIS — I5032 Chronic diastolic (congestive) heart failure: Secondary | ICD-10-CM

## 2020-12-22 DIAGNOSIS — I10 Essential (primary) hypertension: Secondary | ICD-10-CM

## 2020-12-22 NOTE — Progress Notes (Signed)
Tracheal     HPI: Followup apical hypertrophic cardiomyopathy. A previous exercise treadmill revealed no decrease in systolic blood pressure with exercise. Cardiac catheterization following abnormal nuclear study 12/15 revealed apical variant hypertrophic cardiomyopathy and normal coronary arteries. Also with history of diastolic congestive heart failure. Last echocardiogram June 2020 showed hyperdynamic LV function, severe left ventricular hypertrophy, grade 1 diastolic dysfunction, moderate biatrial enlargement and mildly dilated aortic root. Monitor April 2021 showed sinus rhythm with PACs, brief PAT, rare PVC and couplet. Cardiac MRI September 2021 showed asymmetric left ventricular hypertrophy measuring up to 25 mm in the mid inferoseptum consistent with hypertrophic cardiomyopathy reverse septal curvature subtype.  Since she was last seen, she denies dyspnea, chest pain, palpitations or syncope.  Occasional minimal pedal edema improved with diuretic.  Current Outpatient Medications  Medication Sig Dispense Refill   aspirin 81 MG EC tablet Take 81 mg by mouth daily.       Cholecalciferol (VITAMIN D) 50 MCG (2000 UT) tablet Take 2,000 Units by mouth daily.     furosemide (LASIX) 40 MG tablet Take 1 tablet (40 mg total) by mouth daily. 90 tablet 0   gabapentin (NEURONTIN) 100 MG capsule Take 1 capsule (100 mg total) by mouth 2 (two) times daily. 60 capsule 1   lisinopril (ZESTRIL) 10 MG tablet TAKE 1 TABLET BY MOUTH EVERY DAY 90 tablet 1   metoprolol succinate (TOPROL-XL) 25 MG 24 hr tablet Take 0.5 tablets (12.5 mg total) by mouth daily. 30 tablet 1   Omega-3 Fatty Acids (FISH OIL PO) Take 1 tablet by mouth daily.     potassium chloride SA (KLOR-CON M20) 20 MEQ tablet TAKE 1 TABLET BY MOUTH EVERY DAY 90 tablet 0   No current facility-administered medications for this visit.     Past Medical History:  Diagnosis Date   Abnormal nuclear cardiac imaging test 04/22/2014   Anemia    ANXIETY  06/21/2006   tx with sertaline in 2010.  Self d/c, has been doing well since.   Chest pain    cath 7/09: normal cors; EF [id number]   Diastolic congestive heart failure (HCC)    Dyspepsia and other specified disorders of function of stomach 07/04/2013   Ganglion cyst 01/24/2014   GERD (gastroesophageal reflux disease)    HOCM (hypertrophic obstructive cardiomyopathy) ([place])    echo 10/11: normal LVF; apical hypertropy; mod LAE; mild MR;      Holter 10/11: no NSVT;     ETT 10/11: no decreased BP with exercise    HTN (hypertension)    Hyperlipidemia    Neuropathy, upper extremity 08/31/2010   Normal coronary arteries- 2009 and Dec 2915 04/22/2014   SYNCOPE 01/28/2010   Qualifier: Diagnosis of  By: Stanford Breed, MD, Kandyce Rud     Past Surgical History:  Procedure Laterality Date   CARDIAC CATHETERIZATION     LEFT HEART CATHETERIZATION WITH CORONARY/GRAFT ANGIOGRAM N/A 04/21/2014   Procedure: LEFT HEART CATHETERIZATION WITH Beatrix Fetters;  Surgeon: Troy Sine, MD;  Location: [facility] CATH LAB;  Service: Cardiovascular;  Laterality: N/A;   TUBAL LIGATION      Social History   Socioeconomic History   Marital status: Married    Spouse name: Not on file   Number of children: 5   Years of education: Not on file   Highest education level: Not on file  Occupational History   Occupation: paralegal    Employer: [employer]  Tobacco Use   Smoking status: Never   Smokeless tobacco:  Never  Vaping Use   Vaping Use: Never used  Substance and Sexual Activity   Alcohol use: No   Drug use: No   Sexual activity: Yes    Birth control/protection: None  Other Topics Concern   Not on file  Social History Narrative   Married (1st husband died from massive MI), has 5 children (+ 4 from husband's prior marriage), works as Herbalist; Exercises routinely at [facility].  Is planning on adopting 3 foster children (2012)         Social Determinants of Radio broadcast assistant  Strain: Not on file  Food Insecurity: Not on file  Transportation Needs: Not on file  Physical Activity: Not on file  Stress: Not on file  Social Connections: Not on file  Intimate Partner Violence: Not on file    Family History  Problem Relation Age of Onset   Alcohol abuse Father    Cirrhosis Father    Asthma Brother    Cervical cancer Maternal Grandmother     ROS: no fevers or chills, productive cough, hemoptysis, dysphasia, odynophagia, melena, hematochezia, dysuria, hematuria, rash, seizure activity, orthopnea, PND, pedal edema, claudication. Remaining systems are negative.  Physical Exam: Well-developed well-nourished in no acute distress.  Skin is warm and dry.  HEENT is normal.  Neck is supple.  Chest is clear to auscultation with normal expansion.  Cardiovascular exam is regular rate and rhythm.  Abdominal exam nontender or distended. No masses palpated. Extremities show no edema. neuro grossly intact  ECG-sinus bradycardia at a rate of 58, hypertrophy with repolarization abnormality.  Personally reviewed  A/P  Apical variant hypertrophic cardiomyopathy-we will continue beta-blocker at present dose.  We again discussed the importance of having her children screened.  Repeat echocardiogram in September.  Hypertension-blood pressure controlled.  Continue present medications.  Chronic diastolic congestive heart failure-based on history she is doing well from a volume standpoint.  Continue Lasix at present dose.   Kirk Ruths, MD

## 2020-12-22 NOTE — Patient Instructions (Signed)

## 2021-01-19 ENCOUNTER — Other Ambulatory Visit: Payer: Self-pay

## 2021-01-19 ENCOUNTER — Ambulatory Visit (HOSPITAL_COMMUNITY): Payer: 59 | Attending: Cardiology

## 2021-01-19 DIAGNOSIS — I421 Obstructive hypertrophic cardiomyopathy: Secondary | ICD-10-CM | POA: Diagnosis present

## 2021-01-19 LAB — ECHOCARDIOGRAM COMPLETE
Area-P 1/2: 4.6 cm2
S' Lateral: 2.9 cm

## 2021-01-19 MED ORDER — PERFLUTREN LIPID MICROSPHERE
1.0000 mL | INTRAVENOUS | Status: AC | PRN
Start: 2021-01-19 — End: 2021-01-19
  Administered 2021-01-19: 2 mL via INTRAVENOUS

## 2021-01-27 ENCOUNTER — Other Ambulatory Visit: Payer: Self-pay | Admitting: Family Medicine

## 2021-01-28 ENCOUNTER — Other Ambulatory Visit: Payer: Self-pay | Admitting: Family Medicine

## 2021-04-08 ENCOUNTER — Telehealth: Payer: Self-pay | Admitting: Cardiology

## 2021-04-08 ENCOUNTER — Other Ambulatory Visit: Payer: Self-pay | Admitting: Family Medicine

## 2021-04-08 MED ORDER — FUROSEMIDE 40 MG PO TABS: 40.0000 mg | ORAL_TABLET | Freq: Every day | ORAL | 3 refills | Status: DC

## 2021-04-08 NOTE — Telephone Encounter (Signed)
Refill complete 

## 2021-04-08 NOTE — Telephone Encounter (Signed)
°*  STAT* If patient is at the pharmacy, call can be transferred to refill team.   1. Which medications need to be refilled? (please list name of each medication and dose if known) furosemide (LASIX) 40 MG tablet  2. Which pharmacy/location (including street and city if local pharmacy) is medication to be sent to? [id number] - [place], [place] - [address]  3. Do they need a 30 day or 90 day supply? 90  Patient is out of medicaiton and [place]

## 2021-04-09 NOTE — Telephone Encounter (Signed)
Please reach out to patient and advice she obtain Metoprolol refill from the Cardiologist as they are yet to clarify if she need to continue given her low HR while off the medication, or have her come see me soon for medication reconciliation. It is safer for her to obtain her cardiac related medicine from the cardiologist given her complex cardiac history.

## 2021-04-09 NOTE — Telephone Encounter (Signed)
Lasix already refilled by [facility]

## 2021-04-12 NOTE — Telephone Encounter (Signed)
Called patient. No answer, left VM for patient to return call to office to discuss below.   Talbot Grumbling, RN

## 2021-07-20 ENCOUNTER — Encounter: Payer: Self-pay | Admitting: Family Medicine

## 2021-07-21 LAB — HM PAP SMEAR: HM Pap smear: NEGATIVE

## 2021-07-21 LAB — HM MAMMOGRAPHY: HM Mammogram: NORMAL (ref 0–4)

## 2021-07-23 DIAGNOSIS — Z0289 Encounter for other administrative examinations: Secondary | ICD-10-CM

## 2021-08-03 ENCOUNTER — Encounter (INDEPENDENT_AMBULATORY_CARE_PROVIDER_SITE_OTHER): Payer: Self-pay | Admitting: Bariatrics

## 2021-08-03 ENCOUNTER — Ambulatory Visit (INDEPENDENT_AMBULATORY_CARE_PROVIDER_SITE_OTHER): Payer: 59 | Admitting: Bariatrics

## 2021-08-03 VITALS — BP 120/81 | HR 61 | Temp 97.8°F | Ht 65.0 in | Wt 202.0 lb

## 2021-08-03 DIAGNOSIS — E785 Hyperlipidemia, unspecified: Secondary | ICD-10-CM

## 2021-08-03 DIAGNOSIS — R5383 Other fatigue: Secondary | ICD-10-CM | POA: Diagnosis not present

## 2021-08-03 DIAGNOSIS — R7303 Prediabetes: Secondary | ICD-10-CM

## 2021-08-03 DIAGNOSIS — E669 Obesity, unspecified: Secondary | ICD-10-CM

## 2021-08-03 DIAGNOSIS — E66811 Obesity, class 1: Secondary | ICD-10-CM

## 2021-08-03 DIAGNOSIS — I10 Essential (primary) hypertension: Secondary | ICD-10-CM | POA: Diagnosis not present

## 2021-08-03 DIAGNOSIS — I509 Heart failure, unspecified: Secondary | ICD-10-CM

## 2021-08-03 DIAGNOSIS — E559 Vitamin D deficiency, unspecified: Secondary | ICD-10-CM | POA: Diagnosis not present

## 2021-08-03 DIAGNOSIS — Z6832 Body mass index (BMI) 32.0-32.9, adult: Secondary | ICD-10-CM

## 2021-08-03 DIAGNOSIS — Z1331 Encounter for screening for depression: Secondary | ICD-10-CM | POA: Diagnosis not present

## 2021-08-03 DIAGNOSIS — R0602 Shortness of breath: Secondary | ICD-10-CM

## 2021-08-03 DIAGNOSIS — E668 Other obesity: Secondary | ICD-10-CM

## 2021-08-03 DIAGNOSIS — E7849 Other hyperlipidemia: Secondary | ICD-10-CM

## 2021-08-04 LAB — TSH+T4F+T3FREE
Free T4: 0.99 ng/dL (ref 0.82–1.77)
T3, Free: 3.1 pg/mL (ref 2.0–4.4)
TSH: 0.878 u[IU]/mL (ref 0.450–4.500)

## 2021-08-04 LAB — COMPREHENSIVE METABOLIC PANEL
ALT: 17 IU/L (ref 0–32)
AST: 20 IU/L (ref 0–40)
Albumin/Globulin Ratio: 1.7 (ref 1.2–2.2)
Albumin: 4.7 g/dL (ref 3.8–4.9)
Alkaline Phosphatase: 98 IU/L (ref 44–121)
BUN/Creatinine Ratio: 13 (ref 9–23)
BUN: 14 mg/dL (ref 6–24)
Bilirubin Total: 0.5 mg/dL (ref 0.0–1.2)
CO2: 23 mmol/L (ref 20–29)
Calcium: 9.6 mg/dL (ref 8.7–10.2)
Chloride: 103 mmol/L (ref 96–106)
Creatinine, Ser: 1.05 mg/dL — ABNORMAL HIGH (ref 0.57–1.00)
Globulin, Total: 2.8 g/dL (ref 1.5–4.5)
Glucose: 88 mg/dL (ref 70–99)
Potassium: 4.2 mmol/L (ref 3.5–5.2)
Sodium: 141 mmol/L (ref 134–144)
Total Protein: 7.5 g/dL (ref 6.0–8.5)
eGFR: 61 mL/min/{1.73_m2} (ref >59)

## 2021-08-04 LAB — LIPID PANEL WITH LDL/HDL RATIO
Cholesterol, Total: 208 mg/dL — ABNORMAL HIGH (ref 100–199)
HDL: 37 mg/dL — ABNORMAL LOW (ref >39)
LDL Chol Calc (NIH): 150 mg/dL — ABNORMAL HIGH (ref 0–99)
LDL/HDL Ratio: 4.1 ratio — ABNORMAL HIGH (ref 0.0–3.2)
Triglycerides: 113 mg/dL (ref 0–149)
VLDL Cholesterol Cal: 21 mg/dL (ref 5–40)

## 2021-08-04 LAB — INSULIN, RANDOM: INSULIN: 22.8 u[IU]/mL (ref 2.6–24.9)

## 2021-08-04 LAB — VITAMIN D 25 HYDROXY (VIT D DEFICIENCY, FRACTURES): Vit D, 25-Hydroxy: 57.4 ng/mL (ref 30.0–100.0)

## 2021-08-04 LAB — HEMOGLOBIN A1C
Est. average glucose Bld gHb Est-mCnc: 137 mg/dL
Hgb A1c MFr Bld: 6.4 % — ABNORMAL HIGH (ref 4.8–5.6)

## 2021-08-09 NOTE — Progress Notes (Signed)
? ? ? ?Chief Complaint:  ? ?OBESITY ?[name] (MR# [id number]) is a 61 y.o. female who presents for evaluation and treatment of obesity and related comorbidities. Current BMI is Body mass index is 33.61 kg/m?Marland Kitchen [name] has been struggling with her weight for many years and has been unsuccessful in either losing weight, maintaining weight loss, or reaching her healthy weight goal. ? ?[name] states that she does not like to cook.  ? ?[name] is currently in the action stage of change and ready to dedicate time achieving and maintaining a healthier weight. [name] is interested in becoming our patient and working on intensive lifestyle modifications including (but not limited to) diet and exercise for weight loss. ? ?[name]'s habits were reviewed today and are as follows: she thinks her family will eat healthier with her, her desired weight loss is 27 pounds, she has been heavy most of her life, she started gaining weight during menopause, her heaviest weight ever was 254 pounds, she has significant food cravings issues, she snacks frequently in the evenings, she wakes up frequently in the middle of the night to eat, she skips meals frequently, she is frequently drinking liquids with calories, she frequently makes poor food choices, she frequently eats larger portions than normal, and she struggles with emotional eating. ? ?Depression Screen ?[name]'s Food and Mood (modified PHQ-9) score was 6. ? ? ?  08/03/2021  ?  6:59 AM  ?Depression screen PHQ 2/9  ?Decreased Interest 1  ?Down, Depressed, Hopeless 0  ?PHQ - 2 Score 1  ?Altered sleeping 2  ?Tired, decreased energy 2  ?Change in appetite 1  ?Feeling bad or failure about yourself  0  ?Trouble concentrating 0  ?Moving slowly or fidgety/restless 0  ?Suicidal thoughts 0  ?PHQ-9 Score 6  ?Difficult doing work/chores Not difficult at all  ? ?Subjective:  ? ?1. Other fatigue ?[name] will continue activities. [name] admits to daytime somnolence and admits to waking up still  tired. Patient has a history of symptoms of morning fatigue. [name] generally gets 5 hours of sleep per night, and states that she has difficulty falling asleep and generally restful sleep. Snoring is present. Apneic episodes are not present. Epworth Sleepiness Score is 8.   ? ?2. SOB (shortness of breath) on exertion ?[name] notes increasing shortness of breath with exercising and seems to be worsening over time with weight gain. She notes getting out of breath sooner with activity than she used to. This has not gotten worse recently. [name] denies shortness of breath at rest or orthopnea.  ? ?3. Essential hypertension ?[name] is taking lisinopril currently. Her blood pressure is well controlled today at 120/81. ? ?4. Other congestive heart failure ([place]) ?[name] notes a heart murmur with cardiomyopathy. She is currently on Lasix.  ? ?5. Vitamin D deficiency ?[name] is taking Vitamin D over the counter.  ? ?6. Hyperlipidemia, unspecified hyperlipidemia type ?[name] is not on medications currently.  ? ?7. Pre-diabetes ?[name]'s last hemoglobin A1C was 5.9. ? ?Assessment/Plan:  ? ?1. Other fatigue ?[name] will gradually increase activities. We will review EKG and check TSH today. [name] does feel that her weight is causing her energy to be lower than it should be. Fatigue may be related to obesity, depression or many other causes. Labs will be ordered, and in the meanwhile, [name] will focus on self care including making healthy food choices, increasing physical activity and focusing on stress reduction.  ? ?- EKG 12-Lead ?- TSH+T4F+T3Free ? ?2. SOB (shortness of breath)  on exertion ?[name] does feel that she gets out of breath more easily that she used to when she exercises. [name]'s shortness of breath appears to be obesity related and exercise induced. She has agreed to work on weight loss and gradually increase exercise to treat her exercise induced shortness of breath. Will continue to monitor closely.  ? ?-  TSH+T4F+T3Free ? ?3. Essential hypertension ?[name] will continue her medications. We will check CMP today. She is working on healthy weight loss and exercise to improve blood pressure control. We will watch for signs of hypotension as she continues her lifestyle modifications. ? ?- Comprehensive metabolic panel ? ?4. Other congestive heart failure ([place])  ?[name] will follow up with her cardiologist yearly. We will check CMP and Lipid panel today. Disease process and medications reviewed with an emphasis on salt restriction, regular exercise, and regular weight monitoring.   ? ?- Comprehensive metabolic panel ?- Lipid Panel With LDL/HDL Ratio ? ?5.  Vitamin D deficiency ?Low Vitamin D level contributes to fatigue and are associated with obesity, breast, and colon cancer. [name] will continue Vitamin D 2,000 units  daily and she will follow-up for routine testing of Vitamin D, at least 2-3 times per year to avoid over-replacement. We will check Vitamin D today.  ? ? - VITAMIN D 25 Hydroxy (Vit-D Deficiency, Fractures) ? ?6. Hyperlipidemia, unspecified hyperlipidemia type ?Cardiovascular risk and specific lipid/LDL goals reviewed.  We will check Lipid panel today. We discussed several lifestyle modifications today and [name] will continue to work on diet, exercise and weight loss efforts. Orders and follow up as documented in patient record.  ? ?Counseling ?Intensive lifestyle modifications are the first line treatment for this issue. ?Dietary changes: Increase soluble fiber. Decrease simple carbohydrates. ?Exercise changes: Moderate to vigorous-intensity aerobic activity 150 minutes per week if tolerated. ?Lipid-lowering medications: see documented in medical record. ? ?- Lipid Panel With LDL/HDL Ratio ? ?7. Pre-diabetes ?We will check Insulin and A1C today. [name] will continue to work on weight loss, exercise, and decreasing simple carbohydrates to help decrease the risk of diabetes.  ? ?- Insulin, random ?- Hemoglobin  A1c ? ?8. Depression screen ?[name] had a positive depression screening. Depression is commonly associated with obesity and often results in emotional eating behaviors. We will monitor this closely and work on CBT to help improve the non-hunger eating patterns. Referral to Psychology may be required if no improvement is seen as she continues in our clinic.  ? ?9. Class 1 obesity with serious comorbidity and body mass index (BMI) of 32.0 to 32.9 in adult, unspecified obesity type ?[name] is currently in the action stage of change and her goal is to continue with weight loss efforts. I recommend Grover begin the structured treatment plan as follows: ? ?She has agreed to the Category 2 Plan. ? ?Chaley will continue meal planning and she will continue intentional eating. She was provided a handout on Eating Out Sheet today. She will have intermittent fasting labs 14-16 hours.  ? ?Exercise goals: No exercise has been prescribed at this time.  ? ?Behavioral modification strategies: increasing lean protein intake, decreasing simple carbohydrates, increasing vegetables, increasing water intake, decreasing eating out, no skipping meals, meal planning and cooking strategies, keeping healthy foods in the home, and planning for success. ? ?She was informed of the importance of frequent follow-up visits to maximize her success with intensive lifestyle modifications for her multiple health conditions. She was informed we would discuss her lab results at her next visit unless  there is a critical issue that needs to be addressed sooner. Kamyiah agreed to keep her next visit at the agreed upon time to discuss these results. ? ?Objective:  ? ?Blood pressure 120/81, pulse 61, temperature 97.8 ?F (36.6 ?C), height '5\' 5"'$  (1.651 m), weight 202 lb (91.6 kg), SpO2 99 %. Body mass index is 33.61 kg/m?. ? ?EKG: Normal sinus rhythm, rate 61 bpm. ? ?Indirect Calorimeter completed today shows a VO2 of 246 and a REE of 1699.  Her calculated basal  metabolic rate is 7867 thus her basal metabolic rate is better than expected. ? ?General: Cooperative, alert, well developed, in no acute distress. ?HEENT: Conjunctivae and lids unremarkable. ?Cardiovascular: Regular rhythm.

## 2021-08-10 ENCOUNTER — Encounter (INDEPENDENT_AMBULATORY_CARE_PROVIDER_SITE_OTHER): Payer: Self-pay | Admitting: Bariatrics

## 2021-08-17 ENCOUNTER — Ambulatory Visit (INDEPENDENT_AMBULATORY_CARE_PROVIDER_SITE_OTHER): Payer: 59 | Admitting: Bariatrics

## 2021-08-17 ENCOUNTER — Encounter (INDEPENDENT_AMBULATORY_CARE_PROVIDER_SITE_OTHER): Payer: Self-pay | Admitting: Bariatrics

## 2021-08-17 VITALS — BP 129/77 | HR 67 | Temp 97.8°F | Ht 65.0 in | Wt 196.0 lb

## 2021-08-17 DIAGNOSIS — E7800 Pure hypercholesterolemia, unspecified: Secondary | ICD-10-CM

## 2021-08-17 DIAGNOSIS — Z6832 Body mass index (BMI) 32.0-32.9, adult: Secondary | ICD-10-CM

## 2021-08-17 DIAGNOSIS — R632 Polyphagia: Secondary | ICD-10-CM

## 2021-08-17 DIAGNOSIS — R7303 Prediabetes: Secondary | ICD-10-CM | POA: Diagnosis not present

## 2021-08-17 DIAGNOSIS — E669 Obesity, unspecified: Secondary | ICD-10-CM | POA: Diagnosis not present

## 2021-08-17 MED ORDER — WEGOVY 0.25 MG/0.5ML ~~LOC~~ SOAJ: 0.2500 mg | SUBCUTANEOUS | 0 refills | Status: DC

## 2021-08-26 NOTE — Progress Notes (Signed)
Chief Complaint:   OBESITY [name] is here to discuss her progress with her obesity treatment plan along with follow-up of her obesity related diagnoses. [name] is on the Category 2 Plan and states she is following her eating plan approximately 100% of the time. [name] states she is doing virtual exercise 15-30  minutes 5 times per week.  Today's visit was #: 2 Starting weight: 202 lbs Starting date: 08/03/2021 Today's weight: 196 lbs Today's date:08/17/2021 Total lbs lost to date: 6 lbs Total lbs lost since last in-office visit: 6 lbs  Interim History: [name] is down 6 lbs since her first visit. She is enjoying her plan. She is drinking only water. She is weighing her protein.   Subjective:   1. Pre-diabetes [name] is not on medications currently. Her last A1C was 6.4. Her insulin was 22.8. She is not on contraindications. She does not want weight loss medications.   2. Elevated cholesterol [name] is not on medications. Her total cholesterol was 208. HDL was 37. LDL was 153.  3. Polyphagia [name] notes polyphagia at night. She is currently not on contraindications to GLP's.   Assessment/Plan:   1. Pre-diabetes [name] was provided information on pre-diabetes and insulin resistance today. She will continue to work on weight loss, exercise, and decreasing simple carbohydrates to help decrease the risk of diabetes.   - Semaglutide-Weight Management (WEGOVY) 0.25 MG/0.5ML SOAJ; Inject 0.25 mg into the skin once a week.  Dispense: 2 mL; Refill: 0  2. Elevated cholesterol Cardiovascular risk and specific lipid/LDL goals reviewed.  [name] will have no trans fats. She will minimize saturated fats except unprocessed meats and dairy. She will talk with her primary care physician about colony calcium score. We discussed several lifestyle modifications today and [name] will continue to work on diet, exercise and weight loss efforts. Orders and follow up as documented in patient record.    Counseling Intensive lifestyle modifications are the first line treatment for this issue. Dietary changes: Increase soluble fiber. Decrease simple carbohydrates. Exercise changes: Moderate to vigorous-intensity aerobic activity 150 minutes per week if tolerated. Lipid-lowering medications: see documented in medical record.  3. Polyphagia Intensive lifestyle modifications are the first line treatment for this issue. We will refill Wegovy 0.25 mg for 1 month with no refills. We discussed several lifestyle modifications today and she will continue to work on diet, exercise and weight loss efforts. Orders and follow up as documented in patient record.  Counseling Polyphagia is excessive hunger. Causes can include: low blood sugars, hypERthyroidism, PMS, lack of sleep, stress, insulin resistance, diabetes, certain medications, and diets that are deficient in protein and fiber.    - Semaglutide-Weight Management (WEGOVY) 0.25 MG/0.5ML SOAJ; Inject 0.25 mg into the skin once a week.  Dispense: 2 mL; Refill: 0  4. Obesity, Current BMI 32.7 [name] is currently in the action stage of change. As such, her goal is to continue with weight loss efforts. She has agreed to the Category 2 Plan.   [name] will continue to follow the plan closely. She has agreed to meal planning. Labs Reviewed from 08/03/2021 (CMP,Lipids, Vit D, A1c, Insulin, and Thyroid panel).  Exercise goals:  [name] will continue with "walking away the lbs) 15-30 minutes.  Behavioral modification strategies: increasing lean protein intake, decreasing simple carbohydrates, increasing vegetables, increasing water intake, decreasing eating out, no skipping meals, meal planning and cooking strategies, keeping healthy foods in the home, and planning for success.  [name] has agreed to follow-up with our clinic  in 2-3 weeks. She was informed of the importance of frequent follow-up visits to maximize her success with intensive lifestyle modifications  for her multiple health conditions.   Objective:   Blood pressure 129/77, pulse 67, temperature 97.8 F (36.6 C), height '5\' 5"'$  (1.651 m), weight 196 lb (88.9 kg), SpO2 99 %. Body mass index is 32.62 kg/m.  General: Cooperative, alert, well developed, in no acute distress. HEENT: Conjunctivae and lids unremarkable. Cardiovascular: Regular rhythm.  Lungs: Normal work of breathing. Neurologic: No focal deficits.   Lab Results  Component Value Date   CREATININE 1.05 (H) 08/03/2021   BUN 14 08/03/2021   NA 141 08/03/2021   K 4.2 08/03/2021   CL 103 08/03/2021   CO2 23 08/03/2021   Lab Results  Component Value Date   ALT 17 08/03/2021   AST 20 08/03/2021   ALKPHOS 98 08/03/2021   BILITOT 0.5 08/03/2021   Lab Results  Component Value Date   HGBA1C 6.4 (H) 08/03/2021   HGBA1C 5.9 (A) 11/02/2020   HGBA1C 5.5 02/13/2017   HGBA1C 5.9 (H) 04/19/2014   Lab Results  Component Value Date   INSULIN 22.8 08/03/2021   Lab Results  Component Value Date   TSH 0.878 08/03/2021   Lab Results  Component Value Date   CHOL 208 (H) 08/03/2021   HDL 37 (L) 08/03/2021   LDLCALC 150 (H) 08/03/2021   TRIG 113 08/03/2021   CHOLHDL 6.9 (H) 11/02/2020   Lab Results  Component Value Date   VD25OH 57.4 08/03/2021   VD25OH 30.7 05/13/2019   VD25OH 16 (L) 07/04/2012   Lab Results  Component Value Date   WBC 7.2 10/30/2017   HGB 13.8 10/30/2017   HCT 44.0 10/30/2017   MCV 82.9 10/30/2017   PLT 178 10/30/2017   No results found for: IRON, TIBC, FERRITIN  Attestation Statements:   Reviewed by clinician on day of visit: allergies, medications, problem list, medical history, surgical history, family history, social history, and previous encounter notes.  Onnie Boer, am acting as Location manager for [employer], DO.  I have reviewed the above documentation for accuracy and completeness, and I agree with the above. Jearld Lesch, DO

## 2021-08-30 NOTE — Progress Notes (Signed)
?TeleHealth Visit:  ?This visit was completed with telemedicine (audio/video) technology. ?[name] has verbally consented to this TeleHealth visit. The patient is located at home, the provider is located at home. The participants in this visit include the listed provider and patient. The visit was conducted today via MyChart video. ? ?OBESITY ?[name] is here to discuss her progress with her obesity treatment plan along with follow-up of her obesity related diagnoses.  ? ?Today's visit was # 3 ?Starting weight: 202 lbs ?Starting date: 08/03/2021 ?Weight at last in office visit: 196 lbs on 08/17/21 ?Total weight loss: 8 lbs at last in office visit on 08/17/21. ?Today's reported weight: 193 lbs  ? ?Nutrition Plan: the Category 2 Plan.  ?Hunger is moderately controlled. Cravings are moderately controlled.  ?Current exercise:  [place] Muta Walk Away the Pounds  5-6 for 15 minutes. Also gets 7000-15000 steps per day.  ? ?Interim History: [name] is doing an excellent job on the meal plan.  She is down another 3 pounds.  She is able to stick to the plan very well Monday through Friday.  The weekends are little harder because she is usually out running errands and has church and other activities on Sunday.  She sometimes does not get lunch in or has a late lunch.  She is drinking only water and has about 6 bottles per day. ?She is taking a [place] cruise in July.  She loves to travel and hopes to go to [place] at Christmas this year. ? ?Assessment/Plan:  ?1.  Hypertrophic obstructive cardiomyopathy ?She sees Dr. Kirk Ruths.  She notes her heart failure is stable and she is now seeing cardiology yearly.  Last echo showed ejection fraction of 70 to 75%.  She says that her cardiomyopathy is hereditary. ? ?Plan: ?Follow-up with cardiology as directed. ? ?2. Prediabetes ?[name] has a diagnosis of prediabetes based on her elevated HgA1c. ?She denies polyphagia. ?Medication(s): Wegovy 0.25 mg weekly.  Appetite is  moderately well controlled.  Denies side effects. ?Lab Results  ?Component Value Date  ? HGBA1C 6.4 (H) 08/03/2021  ? ?Lab Results  ?Component Value Date  ? INSULIN 22.8 08/03/2021  ? ? ?Plan: ?Inrease dose of Wegovy ?Refill Wegovy 0.5 mg weekly ? ?Obesity: Current BMI 32.62 ?[name] is currently in the action stage of change. As such, her goal is to continue with weight loss efforts.  ?She has agreed to the Category 2 Plan.  ? ?Exercise goals: Continue current regimen. ? ?Behavioral modification strategies: no skipping meals and meal planning and cooking strategies. ?She will pack her lunch on Saturdays and Sundays when she knows she will not be home to eat. ? ?[name] has agreed to follow-up with our clinic in 2 weeks.  ? ?No orders of the defined types were placed in this encounter. ? ? ?Medications Discontinued During This Encounter  ?Medication Reason  ? Semaglutide-Weight Management (WEGOVY) 0.25 MG/0.5ML SOAJ Dose change  ?  ? ?Meds ordered this encounter  ?Medications  ? Semaglutide-Weight Management (WEGOVY) 0.5 MG/0.5ML SOAJ  ?  Sig: Inject 0.5 mg into the skin once a week.  ?  Dispense:  2 mL  ?  Refill:  0  ?  Order Specific Question:   Supervising Provider  ?  Answer:   Dennard Nip D [[id number]]  ?   ? ?Objective:  ? ?VITALS: Per patient if applicable, see vitals. ?GENERAL: Alert and in no acute distress. ?CARDIOPULMONARY: No increased WOB. Speaking in clear sentences.  ?PSYCH: Pleasant and cooperative. Speech normal rate  and rhythm. Affect is appropriate. Insight and judgement are appropriate. Attention is focused, linear, and appropriate.  ?NEURO: Oriented as arrived to appointment on time with no prompting.  ? ?Lab Results  ?Component Value Date  ? CREATININE 1.05 (H) 08/03/2021  ? BUN 14 08/03/2021  ? NA 141 08/03/2021  ? K 4.2 08/03/2021  ? CL 103 08/03/2021  ? CO2 23 08/03/2021  ? ?Lab Results  ?Component Value Date  ? ALT 17 08/03/2021  ? AST 20 08/03/2021  ? ALKPHOS 98 08/03/2021  ? BILITOT 0.5  08/03/2021  ? ?Lab Results  ?Component Value Date  ? HGBA1C 6.4 (H) 08/03/2021  ? HGBA1C 5.9 (A) 11/02/2020  ? HGBA1C 5.5 02/13/2017  ? HGBA1C 5.9 (H) 04/19/2014  ? ?Lab Results  ?Component Value Date  ? INSULIN 22.8 08/03/2021  ? ?Lab Results  ?Component Value Date  ? TSH 0.878 08/03/2021  ? ?Lab Results  ?Component Value Date  ? CHOL 208 (H) 08/03/2021  ? HDL 37 (L) 08/03/2021  ? [place] 150 (H) 08/03/2021  ? TRIG 113 08/03/2021  ? CHOLHDL 6.9 (H) 11/02/2020  ? ?Lab Results  ?Component Value Date  ? WBC 7.2 10/30/2017  ? HGB 13.8 10/30/2017  ? HCT 44.0 10/30/2017  ? MCV 82.9 10/30/2017  ? PLT 178 10/30/2017  ? ?No results found for: IRON, TIBC, FERRITIN ?Lab Results  ?Component Value Date  ? VD25OH 57.4 08/03/2021  ? VD25OH 30.7 05/13/2019  ? VD25OH 16 (L) 07/04/2012  ? ? ?Attestation Statements:  ? ?Reviewed by clinician on day of visit: allergies, medications, problem list, medical history, surgical history, family history, social history, and previous encounter notes. ? ?

## 2021-09-01 ENCOUNTER — Encounter (INDEPENDENT_AMBULATORY_CARE_PROVIDER_SITE_OTHER): Payer: Self-pay | Admitting: Family Medicine

## 2021-09-01 ENCOUNTER — Telehealth (INDEPENDENT_AMBULATORY_CARE_PROVIDER_SITE_OTHER): Payer: 59 | Admitting: Family Medicine

## 2021-09-01 DIAGNOSIS — Z6832 Body mass index (BMI) 32.0-32.9, adult: Secondary | ICD-10-CM | POA: Diagnosis not present

## 2021-09-01 DIAGNOSIS — I421 Obstructive hypertrophic cardiomyopathy: Secondary | ICD-10-CM

## 2021-09-01 DIAGNOSIS — E669 Obesity, unspecified: Secondary | ICD-10-CM

## 2021-09-01 DIAGNOSIS — R7303 Prediabetes: Secondary | ICD-10-CM

## 2021-09-01 MED ORDER — WEGOVY 0.5 MG/0.5ML ~~LOC~~ SOAJ: 0.5000 mg | SUBCUTANEOUS | 0 refills | Status: DC

## 2021-09-14 NOTE — Progress Notes (Unsigned)
TeleHealth Visit:  This visit was completed with telemedicine (audio/video) technology. [name] has verbally consented to this TeleHealth visit. The patient is located at home, the provider is located at home. The participants in this visit include the listed provider and patient. The visit was conducted today via MyChart video.  OBESITY [name] is here to discuss her progress with her obesity treatment plan along with follow-up of her obesity related diagnoses.   Today's visit was # 4 Starting weight: 202 lbs Starting date: 08/03/2021 Weight at last in office visit: 196 lbs on 08/17/21 Total weight loss: 8 lbs at last in office visit on 08/17/21. Today's reported weight: 187 lbs     Nutrition Plan: the Category 2 Plan. Very good compliance.  Hunger is well controlled. Cravings are reduced since starting [facility]. Current exercise: [name] Walk Away the Pounds  5-6 times/week for 15-25 minutes. Also gets 7000-15000 steps per day  Interim History: [name] is doing extremely well.  Compliance with category 2 plan is excellent.  She weighed 187 pounds at home today reflects about a 9 pound loss since her last in office visit. Husband is doing the plan with her and has lost some weight.  She packs her lunch for work daily. She is going to [place] this weekend for the first time.  Assessment/Plan:  1. Prediabetes Miniya has a diagnosis of prediabetes based on her elevated HgA1c.  1C was 6.4 at her first office visit on August 03, 2021.  It was only 5.30 October 2020. She denies polyphagia. Medication(s): Wegovy 0.5 mg weekly Lab Results  Component Value Date   HGBA1C 6.4 (H) 08/03/2021   Lab Results  Component Value Date   INSULIN 22.8 08/03/2021   Plan: Continue Wegovy at 0.5 mg dose weekly.  2. Obesity: Current BMI 32.62 [name] is currently in the action stage of change. As such, her goal is to continue with weight loss efforts.  She has agreed to the Category 2 Plan.    Exercise goals: as is.  Behavioral modification strategies: increasing lean protein intake, decreasing simple carbohydrates, and travel eating strategies.  [name] has agreed to follow-up with our clinic in 3 weeks.   No orders of the defined types were placed in this encounter.   There are no discontinued medications.   No orders of the defined types were placed in this encounter.     Objective:   VITALS: Per patient if applicable, see vitals. GENERAL: Alert and in no acute distress. CARDIOPULMONARY: No increased WOB. Speaking in clear sentences.  PSYCH: Pleasant and cooperative. Speech normal rate and rhythm. Affect is appropriate. Insight and judgement are appropriate. Attention is focused, linear, and appropriate.  NEURO: Oriented as arrived to appointment on time with no prompting.   Lab Results  Component Value Date   CREATININE 1.05 (H) 08/03/2021   BUN 14 08/03/2021   NA 141 08/03/2021   K 4.2 08/03/2021   CL 103 08/03/2021   CO2 23 08/03/2021   Lab Results  Component Value Date   ALT 17 08/03/2021   AST 20 08/03/2021   ALKPHOS 98 08/03/2021   BILITOT 0.5 08/03/2021   Lab Results  Component Value Date   HGBA1C 6.4 (H) 08/03/2021   HGBA1C 5.9 (A) 11/02/2020   HGBA1C 5.5 02/13/2017   HGBA1C 5.9 (H) 04/19/2014   Lab Results  Component Value Date   INSULIN 22.8 08/03/2021   Lab Results  Component Value Date   TSH 0.878 08/03/2021   Lab Results  Component  Value Date   CHOL 208 (H) 08/03/2021   HDL 37 (L) 08/03/2021   LDLCALC 150 (H) 08/03/2021   TRIG 113 08/03/2021   CHOLHDL 6.9 (H) 11/02/2020   Lab Results  Component Value Date   WBC 7.2 10/30/2017   HGB 13.8 10/30/2017   HCT 44.0 10/30/2017   MCV 82.9 10/30/2017   PLT 178 10/30/2017   No results found for: IRON, TIBC, FERRITIN Lab Results  Component Value Date   VD25OH 57.4 08/03/2021   VD25OH 30.7 05/13/2019   VD25OH 16 (L) 07/04/2012    Attestation Statements:   Reviewed by  clinician on day of visit: allergies, medications, problem list, medical history, surgical history, family history, social history, and previous encounter notes.  Time spent on visit including pre-visit chart review and post-visit charting and care was 25 minutes.

## 2021-09-15 ENCOUNTER — Encounter (INDEPENDENT_AMBULATORY_CARE_PROVIDER_SITE_OTHER): Payer: Self-pay | Admitting: Family Medicine

## 2021-09-15 ENCOUNTER — Telehealth (INDEPENDENT_AMBULATORY_CARE_PROVIDER_SITE_OTHER): Payer: 59 | Admitting: Family Medicine

## 2021-09-15 DIAGNOSIS — R7303 Prediabetes: Secondary | ICD-10-CM

## 2021-09-15 DIAGNOSIS — E669 Obesity, unspecified: Secondary | ICD-10-CM

## 2021-09-15 DIAGNOSIS — Z6832 Body mass index (BMI) 32.0-32.9, adult: Secondary | ICD-10-CM | POA: Diagnosis not present

## 2021-09-19 ENCOUNTER — Encounter (INDEPENDENT_AMBULATORY_CARE_PROVIDER_SITE_OTHER): Payer: Self-pay | Admitting: Bariatrics

## 2021-09-27 ENCOUNTER — Encounter: Payer: Self-pay | Admitting: *Deleted

## 2021-10-04 ENCOUNTER — Ambulatory Visit (INDEPENDENT_AMBULATORY_CARE_PROVIDER_SITE_OTHER): Payer: 59 | Admitting: Bariatrics

## 2021-10-04 ENCOUNTER — Encounter (INDEPENDENT_AMBULATORY_CARE_PROVIDER_SITE_OTHER): Payer: Self-pay | Admitting: Bariatrics

## 2021-10-04 VITALS — BP 121/71 | HR 63 | Temp 97.7°F | Ht 65.0 in | Wt 186.0 lb

## 2021-10-04 DIAGNOSIS — E669 Obesity, unspecified: Secondary | ICD-10-CM | POA: Diagnosis not present

## 2021-10-04 DIAGNOSIS — Z6831 Body mass index (BMI) 31.0-31.9, adult: Secondary | ICD-10-CM | POA: Diagnosis not present

## 2021-10-04 DIAGNOSIS — R7303 Prediabetes: Secondary | ICD-10-CM | POA: Diagnosis not present

## 2021-10-04 DIAGNOSIS — E7849 Other hyperlipidemia: Secondary | ICD-10-CM | POA: Diagnosis not present

## 2021-10-04 DIAGNOSIS — Z7985 Long-term (current) use of injectable non-insulin antidiabetic drugs: Secondary | ICD-10-CM

## 2021-10-04 MED ORDER — WEGOVY 1 MG/0.5ML ~~LOC~~ SOAJ: 1.0000 mg | SUBCUTANEOUS | 0 refills | Status: DC

## 2021-10-04 NOTE — Progress Notes (Signed)
Chief Complaint:   OBESITY [name] is here to discuss her progress with her obesity treatment plan along with follow-up of her obesity related diagnoses. [name] is on the Category 2 Plan and states she is following her eating plan approximately 100% of the time. [name] states she is walking for 15 minutes 7 times per week.  Today's visit was #: 5 Starting weight: 202 lbs Starting date: 08/03/2021 Today's weight: 186 lbs Today's date: 10/04/2021 Total lbs lost to date: 16 lbs Total lbs lost since last in-office visit: 10 lbs  Interim History: [name] is down 10 lbs since her last. She has been consistent with the plan. She was craving salt over the weekend. Her goal is 170 lbs.   Subjective:   1. Other hyperlipidemia [name] is not on medications currently.   2. Prediabetes [name] is taking Wegovy. She notes no side effects.   Assessment/Plan:   1. Other hyperlipidemia Cardiovascular risk and specific lipid/LDL goals reviewed.  [name] will decrease trans fats and he will limit saturated. We discussed several lifestyle modifications today and [name] will continue to work on diet, exercise and weight loss efforts. Orders and follow up as documented in patient record.   Counseling Intensive lifestyle modifications are the first line treatment for this issue. Dietary changes: Increase soluble fiber. Decrease simple carbohydrates. Exercise changes: Moderate to vigorous-intensity aerobic activity 150 minutes per week if tolerated. Lipid-lowering medications: see documented in medical record.  2. Prediabetes We will refill Wegovy 0.1 mg for 1 month with no refills. [name] will continue to work on weight loss, exercise, and decreasing simple carbohydrates to help decrease the risk of diabetes.   - Semaglutide-Weight Management (WEGOVY) 1 MG/0.5ML SOAJ; Inject 1 mg into the skin once a week.  Dispense: 2 mL; Refill: 0  3. Obesity, Current BMI 31.1 [name] is currently in the action stage of  change. As such, her goal is to continue with weight loss efforts. She has agreed to the Category 2 Plan.   [name] will have Gatorade Powerade Zero within exactly. She will increase her water intake.    Exercise goals:  As is.   Behavioral modification strategies: increasing lean protein intake, decreasing simple carbohydrates, increasing vegetables, increasing water intake, decreasing eating out, no skipping meals, meal planning and cooking strategies, keeping healthy foods in the home, and planning for success.  [name] has agreed to follow-up with our clinic in 5 weeks. She was informed of the importance of frequent follow-up visits to maximize her success with intensive lifestyle modifications for her multiple health conditions.   Objective:   Blood pressure 121/71, pulse 63, temperature 97.7 F (36.5 C), height '5\' 5"'$  (1.651 m), weight 186 lb (84.4 kg), SpO2 100 %. Body mass index is 30.95 kg/m.  General: Cooperative, alert, well developed, in no acute distress. HEENT: Conjunctivae and lids unremarkable. Cardiovascular: Regular rhythm.  Lungs: Normal work of breathing. Neurologic: No focal deficits.   Lab Results  Component Value Date   CREATININE 1.05 (H) 08/03/2021   BUN 14 08/03/2021   NA 141 08/03/2021   K 4.2 08/03/2021   CL 103 08/03/2021   CO2 23 08/03/2021   Lab Results  Component Value Date   ALT 17 08/03/2021   AST 20 08/03/2021   ALKPHOS 98 08/03/2021   BILITOT 0.5 08/03/2021   Lab Results  Component Value Date   HGBA1C 6.4 (H) 08/03/2021   HGBA1C 5.9 (A) 11/02/2020   HGBA1C 5.5 02/13/2017   HGBA1C 5.9 (H) 04/19/2014  Lab Results  Component Value Date   INSULIN 22.8 08/03/2021   Lab Results  Component Value Date   TSH 0.878 08/03/2021   Lab Results  Component Value Date   CHOL 208 (H) 08/03/2021   HDL 37 (L) 08/03/2021   LDLCALC 150 (H) 08/03/2021   TRIG 113 08/03/2021   CHOLHDL 6.9 (H) 11/02/2020   Lab Results  Component Value Date    VD25OH 57.4 08/03/2021   VD25OH 30.7 05/13/2019   VD25OH 16 (L) 07/04/2012   Lab Results  Component Value Date   WBC 7.2 10/30/2017   HGB 13.8 10/30/2017   HCT 44.0 10/30/2017   MCV 82.9 10/30/2017   PLT 178 10/30/2017   No results found for: "IRON", "TIBC", "FERRITIN"  Attestation Statements:   Reviewed by clinician on day of visit: allergies, medications, problem list, medical history, surgical history, family history, social history, and previous encounter notes.  I, Lizbeth Bark, RMA, am acting as Location manager for [employer], DO.  I have reviewed the above documentation for accuracy and completeness, and I agree with the above. Jearld Lesch, DO

## 2021-10-10 ENCOUNTER — Encounter (INDEPENDENT_AMBULATORY_CARE_PROVIDER_SITE_OTHER): Payer: Self-pay | Admitting: Bariatrics

## 2021-10-17 NOTE — Progress Notes (Signed)
TeleHealth Visit:  This visit was completed with telemedicine (audio/video) technology. [name] has verbally consented to this TeleHealth visit. The patient is located at home, the provider is located at home. The participants in this visit include the listed provider and patient. The visit was conducted today via MyChart video.  OBESITY [name] is here to discuss her progress with her obesity treatment plan along with follow-up of her obesity related diagnoses.   Today's visit was # 6 Starting weight: 202 lbs Starting date: 08/03/2021 Weight at last in office visit: 186 lbs on 10/04/21 Total weight loss: 16 lbs at last in office visit on 10/04/21. Today's reported weight: 181.6 lbs   Nutrition Plan: the Category 2 Plan. - 100% adherence Hunger is moderately controlled. Cravings are moderately controlled.  Current exercise:  [place] Walk Away the Pounds  5-6 for 15 minutes. Averages 8000 steps per day. Got 19000 steps one day.  Interim History:  [name] has been unable to get [facility] due to the national drug shortage and has not had an injection in 2 weeks.  She notes increased hunger.  However she has adhered to the category 2 plan 100%. She is also consistent with exercise and walks 7 days/week. Goal weight is 170 pounds (28 BMI).  She is taking a [place] cruise from July 24 to August 5.  Assessment/Plan:  1. Prediabetes Last A1c was very close to cutoff for diabetes-6.4 on August 03, 2021. Medication(s): Wegovy 0.5 mg.  Has not started the 1 mg dose due to availability. Lab Results  Component Value Date   HGBA1C 6.4 (H) 08/03/2021   Lab Results  Component Value Date   INSULIN 22.8 08/03/2021    Plan: Continue Wegovy at 1 mg dose if available.    2. Polyphagia [name] endorses excessive hunger since being overdue for her [facility].  She has not had a Wegovy dose for 2 weeks.  She was on 0.5 and then dose was increased to 1 mg but she has not been able to obtain the  1 mg dose.   Plan: If she is able to obtain the 1 mg Wegovy she will continue that. If not we will start Saxenda and titrate up to 1.8 mg.  Advised her not to increase the dose if she has nausea.  Hold at current dose for nausea. New prescription sent for: Liraglutide -Weight Management (SAXENDA) 18 MG/3ML SOPN   Sig: Inject 0.6 mg into the skin daily for 4 days, THEN 1.2 mg daily for 4 days, THEN 1.8 mg daily for 20 days.   Dispense:  15 mL   Refill:  0   Order Specific Question:   Supervising Provider   Answer:   Roswell Nickel [[id number]]  Insulin Pen Needle (BD PEN NEEDLE NANO 2ND GEN) 32G X 4 MM MISC   Sig: Use 1 needle daily to inject medication.   Dispense:  100 each   Refill:  0     3. Obesity: Current BMI 30.9 [name] is currently in the action stage of change. As such, her goal is to continue with weight loss efforts.  She has agreed to the Category 2 Plan.   Exercise goals: as is  Behavioral modification strategies: travel eating strategies and planning for success.  [name] has agreed to follow-up with our clinic in 3 weeks.   No orders of the defined types were placed in this encounter.   There are no discontinued medications.   Meds ordered this encounter  Medications   Liraglutide -  Weight Management (SAXENDA) 18 MG/3ML SOPN    Sig: Inject 0.6 mg into the skin daily for 4 days, THEN 1.2 mg daily for 4 days, THEN 1.8 mg daily for 20 days.    Dispense:  15 mL    Refill:  0    Order Specific Question:   Supervising Provider    Answer:   Roswell Nickel [[id number]]   Insulin Pen Needle (BD PEN NEEDLE NANO 2ND GEN) 32G X 4 MM MISC    Sig: Use 1 needle daily to inject medication.    Dispense:  100 each    Refill:  0    Order Specific Question:   Supervising Provider    Answer:   Roswell Nickel [[id number]]      Objective:   VITALS: Per patient if applicable, see vitals. GENERAL: Alert and in no acute distress. CARDIOPULMONARY: No increased WOB. Speaking in clear  sentences.  PSYCH: Pleasant and cooperative. Speech normal rate and rhythm. Affect is appropriate. Insight and judgement are appropriate. Attention is focused, linear, and appropriate.  NEURO: Oriented as arrived to appointment on time with no prompting.   Lab Results  Component Value Date   CREATININE 1.05 (H) 08/03/2021   BUN 14 08/03/2021   NA 141 08/03/2021   K 4.2 08/03/2021   CL 103 08/03/2021   CO2 23 08/03/2021   Lab Results  Component Value Date   ALT 17 08/03/2021   AST 20 08/03/2021   ALKPHOS 98 08/03/2021   BILITOT 0.5 08/03/2021   Lab Results  Component Value Date   HGBA1C 6.4 (H) 08/03/2021   HGBA1C 5.9 (A) 11/02/2020   HGBA1C 5.5 02/13/2017   HGBA1C 5.9 (H) 04/19/2014   Lab Results  Component Value Date   INSULIN 22.8 08/03/2021   Lab Results  Component Value Date   TSH 0.878 08/03/2021   Lab Results  Component Value Date   CHOL 208 (H) 08/03/2021   HDL 37 (L) 08/03/2021   LDLCALC 150 (H) 08/03/2021   TRIG 113 08/03/2021   CHOLHDL 6.9 (H) 11/02/2020   Lab Results  Component Value Date   WBC 7.2 10/30/2017   HGB 13.8 10/30/2017   HCT 44.0 10/30/2017   MCV 82.9 10/30/2017   PLT 178 10/30/2017   No results found for: "IRON", "TIBC", "FERRITIN" Lab Results  Component Value Date   VD25OH 57.4 08/03/2021   VD25OH 30.7 05/13/2019   VD25OH 16 (L) 07/04/2012    Attestation Statements:   Reviewed by clinician on day of visit: allergies, medications, problem list, medical history, surgical history, family history, social history, and previous encounter notes.

## 2021-10-18 ENCOUNTER — Telehealth (INDEPENDENT_AMBULATORY_CARE_PROVIDER_SITE_OTHER): Payer: 59 | Admitting: Family Medicine

## 2021-10-18 ENCOUNTER — Encounter (INDEPENDENT_AMBULATORY_CARE_PROVIDER_SITE_OTHER): Payer: Self-pay | Admitting: Family Medicine

## 2021-10-18 DIAGNOSIS — R7303 Prediabetes: Secondary | ICD-10-CM

## 2021-10-18 DIAGNOSIS — E669 Obesity, unspecified: Secondary | ICD-10-CM

## 2021-10-18 DIAGNOSIS — Z6830 Body mass index (BMI) 30.0-30.9, adult: Secondary | ICD-10-CM | POA: Diagnosis not present

## 2021-10-18 DIAGNOSIS — R632 Polyphagia: Secondary | ICD-10-CM

## 2021-10-18 MED ORDER — BD PEN NEEDLE NANO 2ND GEN 32G X 4 MM MISC: 0 refills | Status: DC

## 2021-10-18 MED ORDER — SAXENDA 18 MG/3ML ~~LOC~~ SOPN: PEN_INJECTOR | SUBCUTANEOUS | 0 refills | Status: DC

## 2021-10-19 ENCOUNTER — Telehealth (INDEPENDENT_AMBULATORY_CARE_PROVIDER_SITE_OTHER): Payer: Self-pay | Admitting: Family Medicine

## 2021-10-19 ENCOUNTER — Encounter (INDEPENDENT_AMBULATORY_CARE_PROVIDER_SITE_OTHER): Payer: Self-pay

## 2021-10-19 NOTE — Telephone Encounter (Signed)
[name] [employer] - Prior authorization approved for [employer]. Per insurance: Patient already had approval and this was a duplicate approval. Patient sent approval message via mychart.

## 2021-11-08 ENCOUNTER — Ambulatory Visit (INDEPENDENT_AMBULATORY_CARE_PROVIDER_SITE_OTHER): Payer: 59 | Admitting: Bariatrics

## 2021-11-08 ENCOUNTER — Encounter (INDEPENDENT_AMBULATORY_CARE_PROVIDER_SITE_OTHER): Payer: Self-pay | Admitting: Bariatrics

## 2021-11-08 VITALS — BP 114/68 | HR 68 | Temp 97.9°F | Ht 65.0 in | Wt 180.0 lb

## 2021-11-08 DIAGNOSIS — Z6830 Body mass index (BMI) 30.0-30.9, adult: Secondary | ICD-10-CM | POA: Diagnosis not present

## 2021-11-08 DIAGNOSIS — R632 Polyphagia: Secondary | ICD-10-CM

## 2021-11-08 DIAGNOSIS — E7849 Other hyperlipidemia: Secondary | ICD-10-CM

## 2021-11-08 DIAGNOSIS — E669 Obesity, unspecified: Secondary | ICD-10-CM

## 2021-11-08 MED ORDER — SAXENDA 18 MG/3ML ~~LOC~~ SOPN: PEN_INJECTOR | SUBCUTANEOUS | 0 refills | Status: DC

## 2021-11-14 NOTE — Progress Notes (Unsigned)
Chief Complaint:   OBESITY [name] is here to discuss her progress with her obesity treatment plan along with follow-up of her obesity related diagnoses. [name] is on the Category 2 Plan and states she is following her eating plan approximately 100% of the time. [name] states she is exercising with Walking Away The Pounds for 15 minutes 7 times per week.  Today's visit was #: 7 Starting weight: 202 lbs Starting date: 08/03/2021 Today's weight: 180 lbs Today's date: 11/08/2021 Total lbs lost to date: 22 Total lbs lost since last in-office visit: 6  Interim History: [name] is down another 6 pounds since her last visit.  She is doing okay on the weekends.  She is doing well with her protein in her new goal is 160 pounds.  Subjective:   1. Polyphagia [name] had been on Wegovy, but could not get it and she started Saxenda.  2. Other hyperlipidemia [name] is not on medications currently.  Assessment/Plan:   1. Polyphagia Takayla will continue Saxenda once weekly, and we will refill for 1 month.  - Liraglutide -Weight Management (SAXENDA) 18 MG/3ML SOPN; Inject 0.6 mg into the skin daily for 4 days, THEN 1.2 mg daily for 4 days, THEN 1.8 mg daily for 20 days.  Dispense: 15 mL; Refill: 0  2. Other hyperlipidemia [name] is to increase MUFA's and PUFA's, and eliminate trans fats.  3. Obesity, Current BMI 30.1 [name] is currently in the action stage of change. As such, her goal is to continue with weight loss efforts. She has agreed to the Category 2 Plan.   Meal planning was discussed.  She will keep her water intake high.  Exercise goals: As is.  Behavioral modification strategies: increasing lean protein intake, decreasing simple carbohydrates, increasing vegetables, increasing water intake, decreasing eating out, no skipping meals, meal planning and cooking strategies, keeping healthy foods in the home, and planning for success.  [name] has agreed to follow-up with our clinic in 4 weeks.  She was informed of the importance of frequent follow-up visits to maximize her success with intensive lifestyle modifications for her multiple health conditions.   Objective:   Blood pressure 114/68, pulse 68, temperature 97.9 F (36.6 C), height '5\' 5"'$  (1.651 m), weight 180 lb (81.6 kg), SpO2 97 %. Body mass index is 29.95 kg/m.  General: Cooperative, alert, well developed, in no acute distress. HEENT: Conjunctivae and lids unremarkable. Cardiovascular: Regular rhythm.  Lungs: Normal work of breathing. Neurologic: No focal deficits.   Lab Results  Component Value Date   CREATININE 1.05 (H) 08/03/2021   BUN 14 08/03/2021   NA 141 08/03/2021   K 4.2 08/03/2021   CL 103 08/03/2021   CO2 23 08/03/2021   Lab Results  Component Value Date   ALT 17 08/03/2021   AST 20 08/03/2021   ALKPHOS 98 08/03/2021   BILITOT 0.5 08/03/2021   Lab Results  Component Value Date   HGBA1C 6.4 (H) 08/03/2021   HGBA1C 5.9 (A) 11/02/2020   HGBA1C 5.5 02/13/2017   HGBA1C 5.9 (H) 04/19/2014   Lab Results  Component Value Date   INSULIN 22.8 08/03/2021   Lab Results  Component Value Date   TSH 0.878 08/03/2021   Lab Results  Component Value Date   CHOL 208 (H) 08/03/2021   HDL 37 (L) 08/03/2021   LDLCALC 150 (H) 08/03/2021   TRIG 113 08/03/2021   CHOLHDL 6.9 (H) 11/02/2020   Lab Results  Component Value Date   VD25OH 57.4 08/03/2021  VD25OH 30.7 05/13/2019   VD25OH 16 (L) 07/04/2012   Lab Results  Component Value Date   WBC 7.2 10/30/2017   HGB 13.8 10/30/2017   HCT 44.0 10/30/2017   MCV 82.9 10/30/2017   PLT 178 10/30/2017   No results found for: "IRON", "TIBC", "FERRITIN"  Attestation Statements:   Reviewed by clinician on day of visit: allergies, medications, problem list, medical history, surgical history, family history, social history, and previous encounter notes.   Wilhemena Durie, am acting as Location manager for [employer], DO.  I have reviewed the above  documentation for accuracy and completeness, and I agree with the above. Jearld Lesch, DO

## 2021-11-15 ENCOUNTER — Encounter (INDEPENDENT_AMBULATORY_CARE_PROVIDER_SITE_OTHER): Payer: Self-pay | Admitting: Bariatrics

## 2021-11-28 NOTE — Progress Notes (Unsigned)
TeleHealth Visit:  This visit was completed with telemedicine (audio/video) technology. Shailey has verbally consented to this TeleHealth visit. The patient is located at home, the provider is located at home. The participants in this visit include the listed provider and patient. The visit was conducted today via MyChart video.  OBESITY [name] is here to discuss her progress with her obesity treatment plan along with follow-up of her obesity related diagnoses.   Today's visit was # 8 Starting weight: 202 lbs Starting date: 08/03/2021 Weight at last in office visit: 180 lbs on 11/08/21 Total weight loss: 22 lbs at last in office visit on 11/08/21. Today's reported weight: 182.6 lbs   Nutrition Plan: the Category 2 Plan.  Hunger is moderately controlled.  Current exercise: walking on cruise- walked 80 miles. Restarted Walking Away The Pounds for 15 minutes  daily.   Interim History: [name] just got back from a [place] cruise 4 days ago.  She had a wonderful time and only gained 2 to 3 pounds.  She is already back on category 2 and back to her walking routine.  However she says they walked 80 miles during the cruise.   New goal is 160 lbs. Assessment/Plan:  1. Polyphagia Medication(s): Saxenda 1.8 mg daily.  Denies nausea, constipation, diarrhea. Effects of medication:  moderately controlled. Cravings are moderately controlled.   Plan: Increase dose of Saxenda to 2.4 mg daily. Refill Saxenda 3 mg daily, dispense 15 mL, no refills.  Patient knows she is to take 2.4 mg daily.   2. Hyperlipidemia LDL is not at goal.  Last LDL was elevated at 150 and HDL was low at 37.  Not on statin.  10-year ASCVD risk score 6.1. Cardiovascular risk factors: dyslipidemia, hypertension, and obesity (BMI >= 30 kg/m2)  Lab Results  Component Value Date   CHOL 208 (H) 08/03/2021   HDL 37 (L) 08/03/2021   LDLCALC 150 (H) 08/03/2021   TRIG 113 08/03/2021   CHOLHDL 6.9 (H) 11/02/2020   Lab  Results  Component Value Date   ALT 17 08/03/2021   AST 20 08/03/2021   ALKPHOS 98 08/03/2021   BILITOT 0.5 08/03/2021   The 10-year ASCVD risk score (Arnett DK, et al., 2019) is: 6.1%   Values used to calculate the score:     Age: 57 years     Sex: Female     Is Non-Hispanic African American: Yes     Diabetic: No     Tobacco smoker: No     Systolic Blood Pressure: 542 mmHg     Is BP treated: Yes     HDL Cholesterol: 37 mg/dL     Total Cholesterol: 208 mg/dL  Plan: Discussed need for statin after last lab check if LDL still elevated. Continue regular exercise and weight loss.   3. Obesity: Current BMI 29.9 [name] is currently in the action stage of change. As such, her goal is to continue with weight loss efforts.  She has agreed to the Category 2 Plan.   Exercise goals: as is  Behavioral modification strategies: increasing lean protein intake, decreasing simple carbohydrates, and planning for success.  [name] has agreed to follow-up with our clinic in 4 weeks.   No orders of the defined types were placed in this encounter.   There are no discontinued medications.   No orders of the defined types were placed in this encounter.     Objective:   VITALS: Per patient if applicable, see vitals. GENERAL: Alert and in no acute  distress. CARDIOPULMONARY: No increased WOB. Speaking in clear sentences.  PSYCH: Pleasant and cooperative. Speech normal rate and rhythm. Affect is appropriate. Insight and judgement are appropriate. Attention is focused, linear, and appropriate.  NEURO: Oriented as arrived to appointment on time with no prompting.   Lab Results  Component Value Date   CREATININE 1.05 (H) 08/03/2021   BUN 14 08/03/2021   NA 141 08/03/2021   K 4.2 08/03/2021   CL 103 08/03/2021   CO2 23 08/03/2021   Lab Results  Component Value Date   ALT 17 08/03/2021   AST 20 08/03/2021   ALKPHOS 98 08/03/2021   BILITOT 0.5 08/03/2021   Lab Results  Component Value  Date   HGBA1C 6.4 (H) 08/03/2021   HGBA1C 5.9 (A) 11/02/2020   HGBA1C 5.5 02/13/2017   HGBA1C 5.9 (H) 04/19/2014   Lab Results  Component Value Date   INSULIN 22.8 08/03/2021   Lab Results  Component Value Date   TSH 0.878 08/03/2021   Lab Results  Component Value Date   CHOL 208 (H) 08/03/2021   HDL 37 (L) 08/03/2021   LDLCALC 150 (H) 08/03/2021   TRIG 113 08/03/2021   CHOLHDL 6.9 (H) 11/02/2020   Lab Results  Component Value Date   WBC 7.2 10/30/2017   HGB 13.8 10/30/2017   HCT 44.0 10/30/2017   MCV 82.9 10/30/2017   PLT 178 10/30/2017   No results found for: "IRON", "TIBC", "FERRITIN" Lab Results  Component Value Date   VD25OH 57.4 08/03/2021   VD25OH 30.7 05/13/2019   VD25OH 16 (L) 07/04/2012    Attestation Statements:   Reviewed by clinician on day of visit: allergies, medications, problem list, medical history, surgical history, family history, social history, and previous encounter notes.

## 2021-11-29 ENCOUNTER — Telehealth (INDEPENDENT_AMBULATORY_CARE_PROVIDER_SITE_OTHER): Payer: 59 | Admitting: Family Medicine

## 2021-11-29 ENCOUNTER — Encounter (INDEPENDENT_AMBULATORY_CARE_PROVIDER_SITE_OTHER): Payer: Self-pay | Admitting: Family Medicine

## 2021-11-29 DIAGNOSIS — R632 Polyphagia: Secondary | ICD-10-CM

## 2021-11-29 DIAGNOSIS — Z6829 Body mass index (BMI) 29.0-29.9, adult: Secondary | ICD-10-CM | POA: Diagnosis not present

## 2021-11-29 DIAGNOSIS — E669 Obesity, unspecified: Secondary | ICD-10-CM | POA: Diagnosis not present

## 2021-11-29 DIAGNOSIS — E7849 Other hyperlipidemia: Secondary | ICD-10-CM

## 2021-11-29 MED ORDER — SAXENDA 18 MG/3ML ~~LOC~~ SOPN: 3.0000 mg | PEN_INJECTOR | Freq: Every day | SUBCUTANEOUS | 0 refills | Status: DC

## 2021-11-29 MED ORDER — BD PEN NEEDLE NANO 2ND GEN 32G X 4 MM MISC: 0 refills | Status: DC

## 2021-11-30 ENCOUNTER — Encounter (INDEPENDENT_AMBULATORY_CARE_PROVIDER_SITE_OTHER): Payer: Self-pay

## 2021-12-12 ENCOUNTER — Other Ambulatory Visit (INDEPENDENT_AMBULATORY_CARE_PROVIDER_SITE_OTHER): Payer: Self-pay | Admitting: Bariatrics

## 2021-12-12 DIAGNOSIS — R632 Polyphagia: Secondary | ICD-10-CM

## 2021-12-12 DIAGNOSIS — R7303 Prediabetes: Secondary | ICD-10-CM

## 2021-12-13 ENCOUNTER — Encounter (INDEPENDENT_AMBULATORY_CARE_PROVIDER_SITE_OTHER): Payer: Self-pay | Admitting: Bariatrics

## 2021-12-13 DIAGNOSIS — R632 Polyphagia: Secondary | ICD-10-CM

## 2021-12-13 MED ORDER — WEGOVY 1.7 MG/0.75ML ~~LOC~~ SOAJ: 1.7000 mg | SUBCUTANEOUS | 0 refills | Status: DC

## 2021-12-21 ENCOUNTER — Encounter: Payer: Self-pay | Admitting: Cardiology

## 2021-12-21 MED ORDER — FUROSEMIDE 40 MG PO TABS: 40.0000 mg | ORAL_TABLET | Freq: Every day | ORAL | 1 refills | Status: DC

## 2021-12-21 NOTE — Telephone Encounter (Signed)
Spoke to patient . Informed her can send a 30 day supply to pharmacy  until her [age] ie ready to be filled. It may be possibility that  her insurance may not cover  since it is early for the medication to be filled.  Patient states she is aware and that she is okay for paying for it out of pocket.   Recall follow up appointment schedule as well.

## 2021-12-27 ENCOUNTER — Ambulatory Visit (INDEPENDENT_AMBULATORY_CARE_PROVIDER_SITE_OTHER): Payer: 59 | Admitting: Bariatrics

## 2021-12-27 ENCOUNTER — Encounter (INDEPENDENT_AMBULATORY_CARE_PROVIDER_SITE_OTHER): Payer: Self-pay | Admitting: Bariatrics

## 2021-12-27 VITALS — BP 120/75 | HR 63 | Temp 98.0°F | Ht 65.0 in | Wt 177.0 lb

## 2021-12-27 DIAGNOSIS — E669 Obesity, unspecified: Secondary | ICD-10-CM | POA: Diagnosis not present

## 2021-12-27 DIAGNOSIS — R7303 Prediabetes: Secondary | ICD-10-CM | POA: Diagnosis not present

## 2021-12-27 DIAGNOSIS — Z6829 Body mass index (BMI) 29.0-29.9, adult: Secondary | ICD-10-CM | POA: Diagnosis not present

## 2021-12-27 DIAGNOSIS — R632 Polyphagia: Secondary | ICD-10-CM

## 2021-12-27 MED ORDER — WEGOVY 1.7 MG/0.75ML ~~LOC~~ SOAJ: 1.7000 mg | SUBCUTANEOUS | 0 refills | Status: DC

## 2022-01-03 ENCOUNTER — Other Ambulatory Visit: Payer: Self-pay | Admitting: Family Medicine

## 2022-01-03 ENCOUNTER — Ambulatory Visit (INDEPENDENT_AMBULATORY_CARE_PROVIDER_SITE_OTHER): Payer: 59 | Admitting: Bariatrics

## 2022-01-04 ENCOUNTER — Telehealth: Payer: Self-pay

## 2022-01-04 NOTE — Progress Notes (Deleted)
HPI: Followup apical hypertrophic cardiomyopathy. A previous exercise treadmill revealed no decrease in systolic blood pressure with exercise. Cardiac catheterization following abnormal nuclear study 12/15 revealed apical variant hypertrophic cardiomyopathy and normal coronary arteries. Also with history of diastolic congestive heart failure. Monitor April 2021 showed sinus rhythm with PACs, brief PAT, rare PVC and couplet. Cardiac MRI September 2021 showed asymmetric left ventricular hypertrophy measuring up to 25 mm in the mid inferoseptum consistent with hypertrophic cardiomyopathy reverse septal curvature subtype.  Echocardiogram September 2022 showed normal LV function, severe apical hypertrophy consistent with apical hypertrophic cardiomyopathy, grade 2 diastolic dysfunction, mild left atrial enlargement, mild mitral regurgitation.  Since she was last seen,   Current Outpatient Medications  Medication Sig Dispense Refill   aspirin 81 MG EC tablet Take 81 mg by mouth daily.       Cholecalciferol (VITAMIN D) 50 MCG (2000 UT) tablet Take 2,000 Units by mouth daily.     CRANBERRY EXTRACT PO Take 500 mg by mouth.     furosemide (LASIX) 40 MG tablet Take 1 tablet (40 mg total) by mouth daily. 30 tablet 1   Insulin Pen Needle (BD PEN NEEDLE NANO 2ND GEN) 32G X 4 MM MISC Use 1 needle daily to inject medication. 100 each 0   lisinopril (ZESTRIL) 10 MG tablet TAKE 1 TABLET BY MOUTH EVERY DAY 15 tablet 0   Omega-3 Fatty Acids (FISH OIL PO) Take 1 tablet by mouth daily.     Semaglutide-Weight Management (WEGOVY) 1.7 MG/0.75ML SOAJ Inject 1.7 mg into the skin once a week. 3 mL 0   No current facility-administered medications for this visit.     Past Medical History:  Diagnosis Date   Abnormal nuclear cardiac imaging test 04/22/2014   Anemia    ANXIETY 06/21/2006   tx with sertaline in 2010.  Self d/c, has been doing well since.   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   Normal coronary arteries- 2009 and Dec 2915 04/22/2014   Palpitations    SOB (shortness of breath)    SYNCOPE 01/28/2010   Qualifier: Diagnosis of  By: Stanford Breed, MD, Kandyce Rud    Vitamin D deficiency     Past Surgical History:  Procedure Laterality Date   CARDIAC CATHETERIZATION     LEFT HEART CATHETERIZATION WITH CORONARY/GRAFT ANGIOGRAM N/A 04/21/2014   Procedure: LEFT HEART CATHETERIZATION WITH Beatrix Fetters;  Surgeon: Troy Sine, MD;  Location: [facility] CATH LAB;  Service: Cardiovascular;  Laterality: N/A;   TUBAL LIGATION      Social History   Socioeconomic History   Marital status: Married    Spouse name: Not on file   Number of children: 5   Years of education: Not on file   Highest education level: Not on file  Occupational History   Occupation:  paralegal    Employer: [employer]  Tobacco Use   Smoking status: Never   Smokeless tobacco: Never  Vaping Use   Vaping Use: Never used  Substance and Sexual Activity   Alcohol use: No   Drug use: No   Sexual activity: Yes    Birth control/protection: None  Other Topics Concern   Not on file  Social History Narrative   Married (1st husband died from massive MI), has 5 children (+ 4 from husband's prior marriage), works as Herbalist; Exercises routinely at [facility].  Is planning on adopting 3 foster children (2012)         Social Determinants of Adult nurse Strain: Not on file  Food Insecurity: Not on file  Transportation Needs: Not on file  Physical Activity: Not on file  Stress: Not on file  Social Connections: Not on file  Intimate Partner Violence: Not on file    Family History  Problem Relation Age of Onset   Alcohol abuse Father    Cirrhosis Father    High blood pressure Father    Heart disease Father    Drug abuse Father    Asthma Brother    Cervical cancer Maternal Grandmother     ROS: no fevers or chills, productive cough, hemoptysis, dysphasia, odynophagia, melena, hematochezia, dysuria, hematuria, rash, seizure activity, orthopnea, PND, pedal edema, claudication. Remaining systems are negative.  Physical Exam: Well-developed well-nourished in no acute distress.  Skin is warm and dry.  HEENT is normal.  Neck is supple.  Chest is clear to auscultation with normal expansion.  Cardiovascular exam is regular rate and rhythm.  Abdominal exam nontender or distended. No masses palpated. Extremities show no edema. neuro grossly intact  ECG- personally reviewed  A/P  1 apical variant hypertrophic cardiomyopathy-continue beta-blocker at present dose.  We have again discussed the importance of having her children screened.  2 hypertension-patient's blood pressure is controlled.  Continue present medical regimen.  3 chronic diastolic congestive heart failure-patient is euvolemic today on examination.  We will continue Lasix at present dose.  Kirk Ruths, MD

## 2022-01-04 NOTE — Telephone Encounter (Signed)
-----   Message from Kinnie Feil, MD sent at 01/04/2022  9:40 AM EDT ----- Hello team,  Please contact patient to schedule f/u appointment. She was last seen > 1 yr ago. We would not continue to refill her meds without an appointment.   Dr. Gwendlyn Deutscher

## 2022-01-05 NOTE — Progress Notes (Unsigned)
Chief Complaint:   OBESITY [name] is here to discuss her progress with her obesity treatment plan along with follow-up of her obesity related diagnoses. [name] is on the Category 2 Plan and states she is following her eating plan approximately 100% of the time. [name] states she is walking away the pounds for 15 minutes 7 times per week.  Today's visit was #: 9 Starting weight: 202 lbs Starting date: 08/03/2021 Today's weight: 177 lbs Today's date: 12/27/2021 Total lbs lost to date: 25 Total lbs lost since last in-office visit: 3  Interim History: [name] is down another 3 pounds since her last visit.  She has been on vacation over the last few weeks.  Subjective:   1. Polyphagia [name] notes decrease in appetite, but she is craving for crunchy foods such as some chips and pork skins.  2. Prediabetes [name] is not on medications for prediabetes except for [facility].  Assessment/Plan:   1. Polyphagia [name] will continue Wegovy 1.7 mg once weekly, and we will refill for 1 month.  - Semaglutide-Weight Management (WEGOVY) 1.7 MG/0.75ML SOAJ; Inject 1.7 mg into the skin once a week.  Dispense: 3 mL; Refill: 0  2. Prediabetes [name] will continue working on minimizing all carbohydrates (sweets and starches).  3. Obesity, Current BMI 29.5 [name] is currently in the action stage of change. As such, her goal is to continue with weight loss efforts. She has agreed to the Category 2 Plan.   Meal planning and intentional eating were discussed.  She is to increase her water intake.  Exercise goals: As is.   Behavioral modification strategies: increasing lean protein intake, decreasing simple carbohydrates, increasing vegetables, increasing water intake, decreasing eating out, no skipping meals, meal planning and cooking strategies, keeping healthy foods in the home, and planning for success.  [name] has agreed to follow-up with our clinic in 4 weeks with [facility], FNP-C. She was informed of  the importance of frequent follow-up visits to maximize her success with intensive lifestyle modifications for her multiple health conditions.   Objective:   Blood pressure 120/75, pulse 63, temperature 98 F (36.7 C), height '5\' 5"'$  (1.651 m), weight 177 lb (80.3 kg), SpO2 96 %. Body mass index is 29.45 kg/m.  General: Cooperative, alert, well developed, in no acute distress. HEENT: Conjunctivae and lids unremarkable. Cardiovascular: Regular rhythm.  Lungs: Normal work of breathing. Neurologic: No focal deficits.   Lab Results  Component Value Date   CREATININE 1.05 (H) 08/03/2021   BUN 14 08/03/2021   NA 141 08/03/2021   K 4.2 08/03/2021   CL 103 08/03/2021   CO2 23 08/03/2021   Lab Results  Component Value Date   ALT 17 08/03/2021   AST 20 08/03/2021   ALKPHOS 98 08/03/2021   BILITOT 0.5 08/03/2021   Lab Results  Component Value Date   HGBA1C 6.4 (H) 08/03/2021   HGBA1C 5.9 (A) 11/02/2020   HGBA1C 5.5 02/13/2017   HGBA1C 5.9 (H) 04/19/2014   Lab Results  Component Value Date   INSULIN 22.8 08/03/2021   Lab Results  Component Value Date   TSH 0.878 08/03/2021   Lab Results  Component Value Date   CHOL 208 (H) 08/03/2021   HDL 37 (L) 08/03/2021   LDLCALC 150 (H) 08/03/2021   TRIG 113 08/03/2021   CHOLHDL 6.9 (H) 11/02/2020   Lab Results  Component Value Date   VD25OH 57.4 08/03/2021   VD25OH 30.7 05/13/2019   VD25OH 16 (L) 07/04/2012  Lab Results  Component Value Date   WBC 7.2 10/30/2017   HGB 13.8 10/30/2017   HCT 44.0 10/30/2017   MCV 82.9 10/30/2017   PLT 178 10/30/2017   No results found for: "IRON", "TIBC", "FERRITIN"  Attestation Statements:   Reviewed by clinician on day of visit: allergies, medications, problem list, medical history, surgical history, family history, social history, and previous encounter notes.   Wilhemena Durie, am acting as Location manager for [employer], DO.  I have reviewed the above documentation for  accuracy and completeness, and I agree with the above. Jearld Lesch, DO

## 2022-01-09 ENCOUNTER — Encounter (INDEPENDENT_AMBULATORY_CARE_PROVIDER_SITE_OTHER): Payer: Self-pay | Admitting: Bariatrics

## 2022-01-11 ENCOUNTER — Telehealth: Payer: Self-pay

## 2022-01-11 NOTE — Telephone Encounter (Signed)
-----   Message from Kinnie Feil, MD sent at 01/04/2022  9:40 AM EDT ----- Hello team,  Please contact patient to schedule f/u appointment. She was last seen > 1 yr ago. We would not continue to refill her meds without an appointment.   Dr. Gwendlyn Deutscher

## 2022-01-11 NOTE — Telephone Encounter (Signed)
Contacted the number that was listed, however someone did answer and stated that I had the wrong number. Could not get a pt on the phone.

## 2022-01-16 ENCOUNTER — Other Ambulatory Visit: Payer: Self-pay | Admitting: Family Medicine

## 2022-01-18 ENCOUNTER — Ambulatory Visit: Payer: 59 | Attending: Cardiology | Admitting: Cardiology

## 2022-01-23 NOTE — Progress Notes (Signed)
TeleHealth Visit:  This visit was completed with telemedicine (audio/video) technology. [name] has verbally consented to this TeleHealth visit. The patient is located at home, the provider is located at home. The participants in this visit include the listed provider and patient. The visit was conducted today via MyChart video.  [name] is here to discuss her progress with her Alyssa treatment plan along with follow-up of her Alyssa related diagnoses.   Today's visit was # 10 Starting weight: 202 lbs Starting date: 08/03/2021 Weight at last in office visit: 177 lbs on 12/27/21 Total weight loss: 25 lbs at last in office visit on 12/27/21. Today's reported weight: 174 lbs    Nutrition Plan: the Category 2 Plan.   Current exercise:  Walking Away The Pounds for 15 minutes 6 days per week.   Interim History: Life has been very busy.  Her mother lives with her and had total knee replacement 1 week ago.  Her husband had an orthopedic injury and they are unsure what treatment will be-possibly surgery.  She has been off of usual routine and not getting all of her food in.  Sometimes skipping dinner and not eating all of her breakfast.  She feels now that her mother is home she will be able to get back into more of a routine. Water intake is good. Exercise is consistent. Her goal weight is 160 pounds (26 BMI)  Assessment/Plan:  1. Polyphagia Well-controlled on Wegovy 1.7 mg weekly.  Denies nausea, constipation, diarrhea.  Plan: Refill Wegovy 1.7 mg weekly.  2. Prediabetes Last A1c was nearly in the diabetic range at 6.4 on 08/03/2021. Medication(s): Wegovy 1.7 mg weekly. Lab Results  Component Value Date   HGBA1C 6.4 (H) 08/03/2021   Lab Results  Component Value Date   INSULIN 22.8 08/03/2021    Plan: [place] labs next office visit. Continue Wegovy 1.7 mg weekly.   3. Alyssa: Current BMI 29.5 [name] is currently in the action stage of change. As such, her goal is to  continue with weight loss efforts.  She has agreed to the Category 2 Plan.   1.  Have protein shake for breakfast if unable to eat breakfast. 2.  Increase frequency of cooking dinner at home.  Exercise goals: as is  Behavioral modification strategies: increasing lean protein intake, no skipping meals, and planning for success.  [name] has agreed to follow-up with our clinic in 4 weeks.   No orders of the defined types were placed in this encounter.   Medications Discontinued During This Encounter  Medication Reason   Insulin Pen Needle (BD PEN NEEDLE NANO 2ND GEN) 32G X 4 MM MISC Completed Course   Semaglutide-Weight Management (WEGOVY) 1.7 MG/0.75ML SOAJ Reorder     Meds ordered this encounter  Medications   Semaglutide-Weight Management (WEGOVY) 1.7 MG/0.75ML SOAJ    Sig: Inject 1.7 mg into the skin once a week.    Dispense:  3 mL    Refill:  0    Order Specific Question:   Supervising Provider    Answer:   Dell Ponto [[id number]]      Objective:   VITALS: Per patient if applicable, see vitals. GENERAL: Alert and in no acute distress. CARDIOPULMONARY: No increased WOB. Speaking in clear sentences.  PSYCH: Pleasant and cooperative. Speech normal rate and rhythm. Affect is appropriate. Insight and judgement are appropriate. Attention is focused, linear, and appropriate.  NEURO: Oriented as arrived to appointment on time with no prompting.   Lab Results  Component  Value Date   CREATININE 1.05 (H) 08/03/2021   BUN 14 08/03/2021   NA 141 08/03/2021   K 4.2 08/03/2021   CL 103 08/03/2021   CO2 23 08/03/2021   Lab Results  Component Value Date   ALT 17 08/03/2021   AST 20 08/03/2021   ALKPHOS 98 08/03/2021   BILITOT 0.5 08/03/2021   Lab Results  Component Value Date   HGBA1C 6.4 (H) 08/03/2021   HGBA1C 5.9 (A) 11/02/2020   HGBA1C 5.5 02/13/2017   HGBA1C 5.9 (H) 04/19/2014   Lab Results  Component Value Date   INSULIN 22.8 08/03/2021   Lab Results  Component  Value Date   TSH 0.878 08/03/2021   Lab Results  Component Value Date   CHOL 208 (H) 08/03/2021   HDL 37 (L) 08/03/2021   LDLCALC 150 (H) 08/03/2021   TRIG 113 08/03/2021   CHOLHDL 6.9 (H) 11/02/2020   Lab Results  Component Value Date   WBC 7.2 10/30/2017   HGB 13.8 10/30/2017   HCT 44.0 10/30/2017   MCV 82.9 10/30/2017   PLT 178 10/30/2017   No results found for: "IRON", "TIBC", "FERRITIN" Lab Results  Component Value Date   VD25OH 57.4 08/03/2021   VD25OH 30.7 05/13/2019   VD25OH 16 (L) 07/04/2012    Attestation Statements:   Reviewed by clinician on day of visit: allergies, medications, problem list, medical history, surgical history, family history, social history, and previous encounter notes.

## 2022-01-24 ENCOUNTER — Telehealth (INDEPENDENT_AMBULATORY_CARE_PROVIDER_SITE_OTHER): Payer: 59 | Admitting: Family Medicine

## 2022-01-24 ENCOUNTER — Encounter (INDEPENDENT_AMBULATORY_CARE_PROVIDER_SITE_OTHER): Payer: Self-pay | Admitting: Family Medicine

## 2022-01-24 DIAGNOSIS — Z6829 Body mass index (BMI) 29.0-29.9, adult: Secondary | ICD-10-CM | POA: Diagnosis not present

## 2022-01-24 DIAGNOSIS — R632 Polyphagia: Secondary | ICD-10-CM

## 2022-01-24 DIAGNOSIS — R7303 Prediabetes: Secondary | ICD-10-CM

## 2022-01-24 DIAGNOSIS — E669 Obesity, unspecified: Secondary | ICD-10-CM | POA: Diagnosis not present

## 2022-01-24 MED ORDER — WEGOVY 1.7 MG/0.75ML ~~LOC~~ SOAJ: 1.7000 mg | SUBCUTANEOUS | 0 refills | Status: DC

## 2022-02-12 NOTE — Progress Notes (Signed)
TeleHealth Visit:  This visit was completed with telemedicine (audio/video) technology. Posey has verbally consented to this TeleHealth visit. The patient is located at home, the provider is located at home. The participants in this visit include the listed provider and patient. The visit was conducted today via MyChart video.  OBESITY [name] is here to discuss her progress with her obesity treatment plan along with follow-up of her obesity related diagnoses.   Today's visit was # 11 Starting weight: 202 lbs Starting date: 08/03/2021 Weight at last in office visit: 177 lbs on 12/27/21 Total weight loss: 25 lbs at last in office visit on 12/27/21. Today's reported weight: 171 lbs   Nutrition Plan: the Category 2 Plan - 80-85% adherence  Current exercise: Walking Away The Pounds for 15 minutes 5-6 days per week.   Interim History: [name] is doing a great job with the meal plan.  Has lost about 6 pounds since her last in office visit on September 5.  She is consistent with following the meal plan and exercise.  She is excited about getting her labs drawn next office visit to see if she has made progress with her health issues such as prediabetes and hyperlipidemia.  Reports she gets in the prescribed protein and water intake is good.  Assessment/Plan:  1. Prediabetes Last A1c was nearly in diabetic range at 6.4 on 08/03/2021.  Polyphagia well-controlled with WYOVZC. Medication(s): Wegovy 1.7 mg weekly. Lab Results  Component Value Date   HGBA1C 6.4 (H) 08/03/2021   Lab Results  Component Value Date   INSULIN 22.8 08/03/2021    Plan: Continue Wegovy and check A1c next office visit.  2. Polyphagia Hunger well-controlled with Wegovy 1.7 mg.  Denies adverse side effects.  Plan: Refill Wegovy 1.7 mg weekly.  3.  Other insomnia Reports fatigue.  Says she has not slept well for years, especially since going through menopause.  She has been busier recently having to take her  grandson to school.  She works as a Radio broadcast assistant.  She goes to bed around 930 and gets up at 515.  She is able to go to sleep but has trouble staying asleep.  Has tried melatonin in the past which helped for short while and then not at all.  Plan: Declined prescription medication. Continue good sleep hygiene. May try melatonin again.  3. Obesity: Current BMI 28.5 Brent is currently in the action stage of change. As such, her goal is to continue with weight loss efforts.  She has agreed to the Category 2 Plan.   Exercise goals: as is  Behavioral modification strategies: increasing lean protein intake, decreasing simple carbohydrates, increasing water intake, and planning for success.  [name] has agreed to follow-up with our clinic in 5 weeks with Dr. Gerarda Fraction.  Will obtain fasting labs.  No orders of the defined types were placed in this encounter.   Medications Discontinued During This Encounter  Medication Reason   Semaglutide-Weight Management (WEGOVY) 1.7 MG/0.75ML SOAJ Reorder     Meds ordered this encounter  Medications   Semaglutide-Weight Management (WEGOVY) 1.7 MG/0.75ML SOAJ    Sig: Inject 1.7 mg into the skin once a week.    Dispense:  3 mL    Refill:  0    Order Specific Question:   Supervising Provider    Answer:   Dell Ponto [[id number]]      Objective:   VITALS: Per patient if applicable, see vitals. GENERAL: Alert and in no acute distress. CARDIOPULMONARY: No increased WOB. Speaking  in clear sentences.  PSYCH: Pleasant and cooperative. Speech normal rate and rhythm. Affect is appropriate. Insight and judgement are appropriate. Attention is focused, linear, and appropriate.  NEURO: Oriented as arrived to appointment on time with no prompting.   Lab Results  Component Value Date   CREATININE 1.05 (H) 08/03/2021   BUN 14 08/03/2021   NA 141 08/03/2021   K 4.2 08/03/2021   CL 103 08/03/2021   CO2 23 08/03/2021   Lab Results  Component Value Date   ALT 17  08/03/2021   AST 20 08/03/2021   ALKPHOS 98 08/03/2021   BILITOT 0.5 08/03/2021   Lab Results  Component Value Date   HGBA1C 6.4 (H) 08/03/2021   HGBA1C 5.9 (A) 11/02/2020   HGBA1C 5.5 02/13/2017   HGBA1C 5.9 (H) 04/19/2014   Lab Results  Component Value Date   INSULIN 22.8 08/03/2021   Lab Results  Component Value Date   TSH 0.878 08/03/2021   Lab Results  Component Value Date   CHOL 208 (H) 08/03/2021   HDL 37 (L) 08/03/2021   LDLCALC 150 (H) 08/03/2021   TRIG 113 08/03/2021   CHOLHDL 6.9 (H) 11/02/2020   Lab Results  Component Value Date   WBC 7.2 10/30/2017   HGB 13.8 10/30/2017   HCT 44.0 10/30/2017   MCV 82.9 10/30/2017   PLT 178 10/30/2017   No results found for: "IRON", "TIBC", "FERRITIN" Lab Results  Component Value Date   VD25OH 57.4 08/03/2021   VD25OH 30.7 05/13/2019   VD25OH 16 (L) 07/04/2012    Attestation Statements:   Reviewed by clinician on day of visit: allergies, medications, problem list, medical history, surgical history, family history, social history, and previous encounter notes.

## 2022-02-13 ENCOUNTER — Other Ambulatory Visit: Payer: Self-pay | Admitting: Family Medicine

## 2022-02-13 MED ORDER — LISINOPRIL 10 MG PO TABS: 10.0000 mg | ORAL_TABLET | Freq: Every day | ORAL | 0 refills | Status: DC

## 2022-02-14 ENCOUNTER — Telehealth (INDEPENDENT_AMBULATORY_CARE_PROVIDER_SITE_OTHER): Payer: 59 | Admitting: Family Medicine

## 2022-02-14 ENCOUNTER — Encounter (INDEPENDENT_AMBULATORY_CARE_PROVIDER_SITE_OTHER): Payer: Self-pay | Admitting: Family Medicine

## 2022-02-14 VITALS — Ht 65.0 in | Wt 171.0 lb

## 2022-02-14 DIAGNOSIS — E669 Obesity, unspecified: Secondary | ICD-10-CM

## 2022-02-14 DIAGNOSIS — G4709 Other insomnia: Secondary | ICD-10-CM

## 2022-02-14 DIAGNOSIS — R632 Polyphagia: Secondary | ICD-10-CM | POA: Diagnosis not present

## 2022-02-14 DIAGNOSIS — Z6828 Body mass index (BMI) 28.0-28.9, adult: Secondary | ICD-10-CM

## 2022-02-14 DIAGNOSIS — R7303 Prediabetes: Secondary | ICD-10-CM

## 2022-02-14 MED ORDER — WEGOVY 1.7 MG/0.75ML ~~LOC~~ SOAJ: 1.7000 mg | SUBCUTANEOUS | 0 refills | Status: DC

## 2022-03-09 ENCOUNTER — Encounter: Payer: Self-pay | Admitting: Family Medicine

## 2022-03-09 ENCOUNTER — Encounter: Payer: Self-pay | Admitting: Cardiology

## 2022-03-09 ENCOUNTER — Other Ambulatory Visit: Payer: Self-pay | Admitting: Family Medicine

## 2022-03-09 MED ORDER — LISINOPRIL 10 MG PO TABS: 10.0000 mg | ORAL_TABLET | Freq: Every day | ORAL | 1 refills | Status: DC

## 2022-03-10 MED ORDER — FUROSEMIDE 40 MG PO TABS: 40.0000 mg | ORAL_TABLET | Freq: Every day | ORAL | 3 refills | Status: DC

## 2022-03-20 ENCOUNTER — Encounter (INDEPENDENT_AMBULATORY_CARE_PROVIDER_SITE_OTHER): Payer: Self-pay | Admitting: Internal Medicine

## 2022-03-20 ENCOUNTER — Ambulatory Visit (INDEPENDENT_AMBULATORY_CARE_PROVIDER_SITE_OTHER): Payer: 59 | Admitting: Internal Medicine

## 2022-03-20 VITALS — BP 104/70 | HR 67 | Temp 98.0°F | Ht 65.0 in | Wt 165.0 lb

## 2022-03-20 DIAGNOSIS — E7849 Other hyperlipidemia: Secondary | ICD-10-CM

## 2022-03-20 DIAGNOSIS — I10 Essential (primary) hypertension: Secondary | ICD-10-CM | POA: Insufficient documentation

## 2022-03-20 DIAGNOSIS — Z6827 Body mass index (BMI) 27.0-27.9, adult: Secondary | ICD-10-CM

## 2022-03-20 DIAGNOSIS — E669 Obesity, unspecified: Secondary | ICD-10-CM

## 2022-03-20 DIAGNOSIS — R632 Polyphagia: Secondary | ICD-10-CM

## 2022-03-20 DIAGNOSIS — R7303 Prediabetes: Secondary | ICD-10-CM | POA: Diagnosis not present

## 2022-03-20 DIAGNOSIS — E559 Vitamin D deficiency, unspecified: Secondary | ICD-10-CM

## 2022-03-20 DIAGNOSIS — R5383 Other fatigue: Secondary | ICD-10-CM | POA: Insufficient documentation

## 2022-03-20 MED ORDER — WEGOVY 1.7 MG/0.75ML ~~LOC~~ SOAJ: 1.7000 mg | SUBCUTANEOUS | 0 refills | Status: DC

## 2022-03-20 NOTE — Progress Notes (Deleted)
The 10-year ASCVD risk score (Arnett DK, et al., 2019) is: 4.6%   Values used to calculate the score:     Age: 61 years     Sex: Female     Is Non-Hispanic African American: Yes     Diabetic: No     Tobacco smoker: No     Systolic Blood Pressure: 136 mmHg     Is BP treated: Yes     HDL Cholesterol: 37 mg/dL     Total Cholesterol: 208 mg/dL

## 2022-03-21 LAB — COMPREHENSIVE METABOLIC PANEL
ALT: 13 IU/L (ref 0–32)
AST: 21 IU/L (ref 0–40)
Albumin/Globulin Ratio: 1.4 (ref 1.2–2.2)
Albumin: 4.6 g/dL (ref 3.8–4.9)
Alkaline Phosphatase: 91 IU/L (ref 44–121)
BUN/Creatinine Ratio: 14 (ref 9–23)
BUN: 14 mg/dL (ref 6–24)
Bilirubin Total: 0.5 mg/dL (ref 0.0–1.2)
CO2: 25 mmol/L (ref 20–29)
Calcium: 9.8 mg/dL (ref 8.7–10.2)
Chloride: 101 mmol/L (ref 96–106)
Creatinine, Ser: 1.03 mg/dL — ABNORMAL HIGH (ref 0.57–1.00)
Globulin, Total: 3.2 g/dL (ref 1.5–4.5)
Glucose: 87 mg/dL (ref 70–99)
Potassium: 4.2 mmol/L (ref 3.5–5.2)
Sodium: 143 mmol/L (ref 134–144)
Total Protein: 7.8 g/dL (ref 6.0–8.5)
eGFR: 63 mL/min/{1.73_m2} (ref >59)

## 2022-03-21 LAB — CBC WITH DIFFERENTIAL/PLATELET
Basophils Absolute: 0.1 10*3/uL (ref 0.0–0.2)
Basos: 1 %
EOS (ABSOLUTE): 0.1 10*3/uL (ref 0.0–0.4)
Eos: 1 %
Hematocrit: 45.4 % (ref 34.0–46.6)
Hemoglobin: 14.2 g/dL (ref 11.1–15.9)
Immature Grans (Abs): 0 10*3/uL (ref 0.0–0.1)
Immature Granulocytes: 0 %
Lymphocytes Absolute: 2.2 10*3/uL (ref 0.7–3.1)
Lymphs: 23 %
MCH: 25.5 pg — ABNORMAL LOW (ref 26.6–33.0)
MCHC: 31.3 g/dL — ABNORMAL LOW (ref 31.5–35.7)
MCV: 82 fL (ref 79–97)
Monocytes Absolute: 0.8 10*3/uL (ref 0.1–0.9)
Monocytes: 8 %
Neutrophils Absolute: 6.6 10*3/uL (ref 1.4–7.0)
Neutrophils: 67 %
Platelets: 265 10*3/uL (ref 150–450)
RBC: 5.57 x10E6/uL — ABNORMAL HIGH (ref 3.77–5.28)
RDW: 14.5 % (ref 11.7–15.4)
WBC: 9.8 10*3/uL (ref 3.4–10.8)

## 2022-03-21 LAB — HEMOGLOBIN A1C
Est. average glucose Bld gHb Est-mCnc: 123 mg/dL
Hgb A1c MFr Bld: 5.9 % — ABNORMAL HIGH (ref 4.8–5.6)

## 2022-03-21 LAB — LIPID PANEL WITH LDL/HDL RATIO
Cholesterol, Total: 200 mg/dL — ABNORMAL HIGH (ref 100–199)
HDL: 46 mg/dL (ref >39)
LDL Chol Calc (NIH): 140 mg/dL — ABNORMAL HIGH (ref 0–99)
LDL/HDL Ratio: 3 ratio (ref 0.0–3.2)
Triglycerides: 77 mg/dL (ref 0–149)
VLDL Cholesterol Cal: 14 mg/dL (ref 5–40)

## 2022-03-21 LAB — VITAMIN D 25 HYDROXY (VIT D DEFICIENCY, FRACTURES): Vit D, 25-Hydroxy: 74.7 ng/mL (ref 30.0–100.0)

## 2022-03-21 LAB — INSULIN, RANDOM: INSULIN: 19.8 u[IU]/mL (ref 2.6–24.9)

## 2022-03-29 NOTE — Progress Notes (Signed)
Chief Complaint:   OBESITY [name] is here to discuss her progress with her obesity treatment plan along with follow-up of her obesity related diagnoses. [name] is on the Category 2 Plan and states she is following her eating plan approximately 98% of the time. [name] states she is exercising with Walking Away The Pounds for 15 minutes 7 times per week.  Today's visit was #: 12 Starting weight: 202 lbs Starting date: 08/03/2021 Today's weight: 165 lbs Today's date: 03/20/2022 Total lbs lost to date: 37 Total lbs lost since last in-office visit: 6  Interim History: [name] is doing well, and she was following meals and making healthy choices while traveling.  She would like to lose an additional 10 pounds if possible.  Her BMI has decreased from 35 to 27.  She is on Wegovy without side effects.  She is not drinking enough water.  Our bioimpedance scale shows body fat percentage has decreased from 36.9 to 31.4%.  Subjective:   1. Hypertension, essential [name]'s blood pressure is well controlled on lisinopril with no side effects.  2. Prediabetes [name] was counseled on the condition and risk of progression.  Her last A1c on 08/03/2021 was 6.4.  I discussed labs with the patient today.  3. Vitamin D deficiency [name] is on vitamin D supplementation and associated with adiposity and leptin resistance.  I discussed labs with the patient today.  4. Other hyperlipidemia [name]'s ASCVD risk score is 1.6%, no need for cholesterol-lowering medications.  Her last LDL was 150 on 08/03/2021  Assessment/Plan:   1. Hypertension, essential Zamarah will continue lisinopril, we will check labs today.  - Comprehensive metabolic panel  2. Prediabetes We will check labs today.  [name] will continue with her weight loss therapy and increase her physical activity to 150 minutes a week.  - CBC with Differential/Platelet - Hemoglobin A1c - Insulin, random  3. Vitamin D deficiency We will check labs today  for a goal of 50-60.  - VITAMIN D 25 Hydroxy (Vit-D Deficiency, Fractures)  4. Other hyperlipidemia We will check labs today.  [name] will continue with her weight loss therapy.  - Lipid Panel With LDL/HDL Ratio  5. Obesity, Current BMI 27.5 [name] is currently in the action stage of change. As such, her goal is to continue with weight loss efforts. She has agreed to the Category 2 Plan.   Continue weight loss therapy.   [name] will continue Wegovy 1.7 mg once weekly, and we will refill for 1 month.   - Semaglutide-Weight Management (WEGOVY) 1.7 MG/0.75ML SOAJ; Inject 1.7 mg into the skin once a week.  Dispense: 3 mL; Refill: 0  Exercise goals: As is.  Behavioral modification strategies: increasing lean protein intake, increasing water intake, and no skipping meals.  [name] has agreed to follow-up with our clinic in 4 weeks. She was informed of the importance of frequent follow-up visits to maximize her success with intensive lifestyle modifications for her multiple health conditions.   [name] was informed we would discuss her lab results at her next visit unless there is a critical issue that needs to be addressed sooner. [name] agreed to keep her next visit at the agreed upon time to discuss these results.  Objective:   Blood pressure 104/70, pulse 67, temperature 98 F (36.7 C), height '5\' 5"'$  (1.651 m), weight 165 lb (74.8 kg), SpO2 97 %. Body mass index is 27.46 kg/m.  General: Cooperative, alert, well developed, in no acute distress. HEENT: Conjunctivae and lids unremarkable.  Cardiovascular: Regular rhythm.  Lungs: Normal work of breathing. Neurologic: No focal deficits.   Lab Results  Component Value Date   CREATININE 1.03 (H) 03/20/2022   BUN 14 03/20/2022   NA 143 03/20/2022   K 4.2 03/20/2022   CL 101 03/20/2022   CO2 25 03/20/2022   Lab Results  Component Value Date   ALT 13 03/20/2022   AST 21 03/20/2022   ALKPHOS 91 03/20/2022   BILITOT 0.5 03/20/2022   Lab  Results  Component Value Date   HGBA1C 5.9 (H) 03/20/2022   HGBA1C 6.4 (H) 08/03/2021   HGBA1C 5.9 (A) 11/02/2020   HGBA1C 5.5 02/13/2017   HGBA1C 5.9 (H) 04/19/2014   Lab Results  Component Value Date   INSULIN 19.8 03/20/2022   INSULIN 22.8 08/03/2021   Lab Results  Component Value Date   TSH 0.878 08/03/2021   Lab Results  Component Value Date   CHOL 200 (H) 03/20/2022   HDL 46 03/20/2022   LDLCALC 140 (H) 03/20/2022   TRIG 77 03/20/2022   CHOLHDL 6.9 (H) 11/02/2020   Lab Results  Component Value Date   VD25OH 74.7 03/20/2022   VD25OH 57.4 08/03/2021   VD25OH 30.7 05/13/2019   Lab Results  Component Value Date   WBC 9.8 03/20/2022   HGB 14.2 03/20/2022   HCT 45.4 03/20/2022   MCV 82 03/20/2022   PLT 265 03/20/2022   No results found for: "IRON", "TIBC", "FERRITIN"  Attestation Statements:   Reviewed by clinician on day of visit: allergies, medications, problem list, medical history, surgical history, family history, social history, and previous encounter notes.   Wilhemena Durie, am acting as transcriptionist for Thomes Dinning, MD.  I have reviewed the above documentation for accuracy and completeness, and I agree with the above. -Thomes Dinning, MD

## 2022-04-02 ENCOUNTER — Other Ambulatory Visit: Payer: Self-pay | Admitting: Family Medicine

## 2022-04-12 ENCOUNTER — Encounter: Payer: Self-pay | Admitting: Family Medicine

## 2022-04-18 NOTE — Progress Notes (Unsigned)
TeleHealth Visit:  This visit was completed with telemedicine (audio/video) technology. [name] has verbally consented to this TeleHealth visit. The patient is located at home, the provider is located at home. The participants in this visit include the listed provider and patient. The visit was conducted today via MyChart video.  OBESITY [name] is here to discuss her progress with her obesity treatment plan along with follow-up of her obesity related diagnoses.   Today's visit was # 13 Starting weight: 202 lbs Starting date: 08/03/2021 Weight at last in office visit: 165 lbs on 03/20/22 Total weight loss: 37 lbs at last in office visit on 03/20/22. Today's reported weight: 163 lbs   Nutrition Plan: the Category 2 Plan - 70% adherence  Current exercise:  Walking Away The Pounds for 15 minutes 3-4 times per week.  Interim History:  [name] has lost a few pounds since her last office visit despite attending several holiday parties.  She is exercising less frequently due to being busy.  She is contemplating starting resistance training and plans on doing this after the first of the year. Reports she is getting an adequate protein.  Working on increasing water but is unable to do 90 ounces on workdays due to frequency of bathroom trips.  Appetite and cravings very well-controlled on Wegovy 1.7 mg weekly.  Denies constipation or nausea.  Has lost about 14 pounds of muscle mass.  Goal is 155 lbs. (27 BMI). Assessment/Plan:  We discussed recent lab results in depth.  1. Hyperlipidemia LDL elevated at 140.  HDL slightly low at 46.  Triglycerides normal at 77.  10-year ASCVD risk score is 3.9%.  No family history of premature stroke or heart attack.  Does not smoke. Medication(s): Fish oil daily  Lab Results  Component Value Date   CHOL 200 (H) 03/20/2022   HDL 46 03/20/2022   LDLCALC 140 (H) 03/20/2022   TRIG 77 03/20/2022   CHOLHDL 6.9 (H) 11/02/2020   Lab Results  Component  Value Date   ALT 13 03/20/2022   AST 21 03/20/2022   ALKPHOS 91 03/20/2022   BILITOT 0.5 03/20/2022   The 10-year ASCVD risk score (Arnett DK, et al., 2019) is: 3.9%   Values used to calculate the score:     Age: 61 years     Sex: Female     Is Non-Hispanic African American: Yes     Diabetic: No     Tobacco smoker: No     Systolic Blood Pressure: 811 mmHg     Is BP treated: Yes     HDL Cholesterol: 46 mg/dL     Total Cholesterol: 200 mg/dL  Plan: No statin indicated. Continue fish oil. Increase fiber in diet. Add resistance training to exercise regimen.   2. Prediabetes A1c elevated but improved.  Currently 5.9, down from 6.4 on 08/03/2021. Medication(s): Wegovy 1.7 mg weekly Lab Results  Component Value Date   HGBA1C 5.9 (H) 03/20/2022   Lab Results  Component Value Date   INSULIN 19.8 03/20/2022   INSULIN 22.8 08/03/2021    Plan: Continue Wegovy 1.7 mg weekly.   3. Vitamin D Deficiency Vitamin D is at goal of 50.  Vitamin D level 74, up from 57 on 08/03/2021.  She continues to take OTC vitamin D 2000 IU daily. Lab Results  Component Value Date   VD25OH 74.7 03/20/2022   VD25OH 57.4 08/03/2021   VD25OH 30.7 05/13/2019    Plan: Continue OTC vitamin D 2000 IU daily. Recheck vitamin D level  in 3 to 4 months.   4. Obesity: Current BMI 27.5 [name] is currently in the action stage of change. As such, her goal is to continue with weight loss efforts.  She has agreed to the Category 2 Plan.   1.  Aim for 64 to 70 ounces of water on workdays and 90 ounces on weekends.  Exercise goals: Continue walking away the pounds and increase frequency to 5 to 7 days/week. Discussed adding resistance training 3 times per week using videos, hand weights, body weight resistance.  Behavioral modification strategies: increasing lean protein intake, decreasing simple carbohydrates, increasing water intake, and planning for success.  [name] has agreed to follow-up with our clinic  in 4 weeks.   No orders of the defined types were placed in this encounter.   Medications Discontinued During This Encounter  Medication Reason   Semaglutide-Weight Management (WEGOVY) 1.7 MG/0.75ML SOAJ Reorder     Meds ordered this encounter  Medications   Semaglutide-Weight Management (WEGOVY) 1.7 MG/0.75ML SOAJ    Sig: Inject 1.7 mg into the skin once a week.    Dispense:  3 mL    Refill:  0    Order Specific Question:   Supervising Provider    Answer:   Dell Ponto [[id number]]      Objective:   VITALS: Per patient if applicable, see vitals. GENERAL: Alert and in no acute distress. CARDIOPULMONARY: No increased WOB. Speaking in clear sentences.  PSYCH: Pleasant and cooperative. Speech normal rate and rhythm. Affect is appropriate. Insight and judgement are appropriate. Attention is focused, linear, and appropriate.  NEURO: Oriented as arrived to appointment on time with no prompting.   Lab Results  Component Value Date   CREATININE 1.03 (H) 03/20/2022   BUN 14 03/20/2022   NA 143 03/20/2022   K 4.2 03/20/2022   CL 101 03/20/2022   CO2 25 03/20/2022   Lab Results  Component Value Date   ALT 13 03/20/2022   AST 21 03/20/2022   ALKPHOS 91 03/20/2022   BILITOT 0.5 03/20/2022   Lab Results  Component Value Date   HGBA1C 5.9 (H) 03/20/2022   HGBA1C 6.4 (H) 08/03/2021   HGBA1C 5.9 (A) 11/02/2020   HGBA1C 5.5 02/13/2017   HGBA1C 5.9 (H) 04/19/2014   Lab Results  Component Value Date   INSULIN 19.8 03/20/2022   INSULIN 22.8 08/03/2021   Lab Results  Component Value Date   TSH 0.878 08/03/2021   Lab Results  Component Value Date   CHOL 200 (H) 03/20/2022   HDL 46 03/20/2022   LDLCALC 140 (H) 03/20/2022   TRIG 77 03/20/2022   CHOLHDL 6.9 (H) 11/02/2020   Lab Results  Component Value Date   WBC 9.8 03/20/2022   HGB 14.2 03/20/2022   HCT 45.4 03/20/2022   MCV 82 03/20/2022   PLT 265 03/20/2022   No results found for: "IRON", "TIBC", "FERRITIN" Lab  Results  Component Value Date   VD25OH 74.7 03/20/2022   VD25OH 57.4 08/03/2021   VD25OH 30.7 05/13/2019    Attestation Statements:   Reviewed by clinician on day of visit: allergies, medications, problem list, medical history, surgical history, family history, social history, and previous encounter notes.

## 2022-04-19 ENCOUNTER — Encounter (INDEPENDENT_AMBULATORY_CARE_PROVIDER_SITE_OTHER): Payer: Self-pay | Admitting: Family Medicine

## 2022-04-19 ENCOUNTER — Telehealth (INDEPENDENT_AMBULATORY_CARE_PROVIDER_SITE_OTHER): Payer: 59 | Admitting: Family Medicine

## 2022-04-19 DIAGNOSIS — E785 Hyperlipidemia, unspecified: Secondary | ICD-10-CM

## 2022-04-19 DIAGNOSIS — R7303 Prediabetes: Secondary | ICD-10-CM

## 2022-04-19 DIAGNOSIS — E669 Obesity, unspecified: Secondary | ICD-10-CM | POA: Diagnosis not present

## 2022-04-19 DIAGNOSIS — E559 Vitamin D deficiency, unspecified: Secondary | ICD-10-CM

## 2022-04-19 DIAGNOSIS — E7849 Other hyperlipidemia: Secondary | ICD-10-CM

## 2022-04-19 DIAGNOSIS — Z6827 Body mass index (BMI) 27.0-27.9, adult: Secondary | ICD-10-CM

## 2022-04-19 MED ORDER — WEGOVY 1.7 MG/0.75ML ~~LOC~~ SOAJ: 1.7000 mg | SUBCUTANEOUS | 0 refills | Status: DC

## 2022-05-05 ENCOUNTER — Encounter: Payer: Self-pay | Admitting: Family Medicine

## 2022-05-05 ENCOUNTER — Ambulatory Visit: Payer: 59 | Admitting: Family Medicine

## 2022-05-05 VITALS — BP 119/62 | HR 64 | Ht 65.0 in | Wt 165.4 lb

## 2022-05-05 DIAGNOSIS — H9193 Unspecified hearing loss, bilateral: Secondary | ICD-10-CM

## 2022-05-05 DIAGNOSIS — I421 Obstructive hypertrophic cardiomyopathy: Secondary | ICD-10-CM | POA: Diagnosis not present

## 2022-05-05 DIAGNOSIS — H9313 Tinnitus, bilateral: Secondary | ICD-10-CM | POA: Diagnosis not present

## 2022-05-05 DIAGNOSIS — I509 Heart failure, unspecified: Secondary | ICD-10-CM | POA: Diagnosis not present

## 2022-05-05 DIAGNOSIS — I10 Essential (primary) hypertension: Secondary | ICD-10-CM | POA: Diagnosis not present

## 2022-05-05 NOTE — Assessment & Plan Note (Signed)
Doing well. Off Metoprolol per cards. Continue Lasix and Lisinopril. F/U Cards as planned.

## 2022-05-05 NOTE — Assessment & Plan Note (Signed)
Stable

## 2022-05-05 NOTE — Assessment & Plan Note (Signed)
BP is at goal.

## 2022-05-05 NOTE — Progress Notes (Signed)
    SUBJECTIVE:   CHIEF COMPLAINT / HPI:   HTN/HCOM/CHF: She is here for f/u. Compliant with ASA 81 mg, Lasix 40 mg QD, Lisinopril 10 mg QD. She denies cardiac symptoms. She is compliant with cards f/u. Was taken off betablocker by her cardiologist.  PAP/Mammogram: She stated she had this done in Oct 2023 with her Gyn specialist.  Tinnitus:  C/O tinnitus and hearing loss x 6 months in both ears. She denies ear discharge or trauma. 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 She denies dizziness or gait abnormality. Will like to get this evaluated.  PERTINENT  PMH / PSH: PMhx reviewed  OBJECTIVE:   BP 119/62   Pulse 64   Ht '5\' 5"'$  (1.651 m)   Wt 165 lb 6 oz (75 kg)   SpO2 98%   BMI 27.52 kg/m   Physical Exam Vitals and nursing note reviewed.  Cardiovascular:     Rate and Rhythm: Normal rate and regular rhythm.     Pulses: Normal pulses.     Heart sounds: Normal heart sounds. No murmur heard. Pulmonary:     Effort: Pulmonary effort is normal. No respiratory distress.     Breath sounds: Normal breath sounds. No wheezing.  Abdominal:     General: Abdomen is flat. Bowel sounds are normal. There is no distension.     Tenderness: There is no abdominal tenderness.  Musculoskeletal:     Right lower leg: No edema.     Left lower leg: No edema.    Hearing Screening   '250Hz'$  '500Hz'$  '1000Hz'$  '2000Hz'$  '4000Hz'$   Right ear '20 25 25 20 20  '$ Left ear '20 25 25 20 20     '$ ASSESSMENT/PLAN:   Hypertrophic obstructive cardiomyopathy Doing well. Off Metoprolol per cards. Continue Lasix and Lisinopril. F/U Cards as planned.  Hypertension, essential BP is at goal.  Congestive heart failure ([place]) Stable   Tinnitus/Hearing difficulty: She did well with hearing screening today. Referred to ENT for assessment.  She signed ROI for mammogram and PAP report. Declined covid 19 shot  Andrena Mews, MD [place]

## 2022-05-05 NOTE — Patient Instructions (Signed)
Tinnitus ?Tinnitus refers to hearing a sound when there is no actual source for that sound. This is often described as ringing in the ears. However, people with this condition may hear a variety of noises, in one ear or in both ears. ?The sounds of tinnitus can be soft, loud, or somewhere in between. Tinnitus can last for a few seconds or can be constant for days. It may go away without treatment and come back at various times. When tinnitus is constant or happens often, it can lead to other problems, such as trouble sleeping and trouble concentrating. ?Almost everyone experiences tinnitus at some point. Tinnitus is not the same as hearing loss. Tinnitus that is long-lasting (chronic) or comes back often (recurs) may require medical attention. ?What are the causes? ?The cause of tinnitus is often not known. In some cases, it can result from: ?Exposure to loud noises from machinery, music, or other sources. ?An object (foreign body) stuck in the ear. ?Earwax buildup. ?Drinking alcohol or caffeine. ?Taking certain medicines. ?Age-related hearing loss. ?It may also be caused by medical conditions such as: ?Ear or sinus infections. ?Heart diseases or high blood pressure. ?Allergies. ?M?ni?re's disease. ?Thyroid problems. ?Tumors. ?A weak, bulging blood vessel (aneurysm) near the ear. ?What increases the risk? ?The following factors may make you more likely to develop this condition: ?Exposure to loud noises. ?Age. Tinnitus is more likely in older individuals. ?Using alcohol or tobacco. ?What are the signs or symptoms? ?The main symptom of tinnitus is hearing a sound when there is no source for that sound. It may sound like: ?Buzzing. ?Sizzling. ?Ringing. ?Blowing air. ?Hissing. ?Whistling. ?Other sounds may include: ?Roaring. ?Running water. ?A musical note. ?Tapping. ?Humming. ?Symptoms may affect only one ear (unilateral) or both ears (bilateral). ?How is this diagnosed? ?Tinnitus is diagnosed based on your symptoms,  your medical history, and a physical exam. Your health care provider may do a thorough hearing test (audiologic exam) if your tinnitus: ?Is unilateral. ?Causes hearing difficulties. ?Lasts 6 months or longer. ?You may work with a health care provider who specializes in hearing disorders (audiologist). You may be asked questions about your symptoms and how they affect your daily life. You may have other tests done, such as: ?CT scan. ?MRI. ?An imaging test of how blood flows through your blood vessels (angiogram). ?How is this treated? ?Treating an underlying medical condition can sometimes make tinnitus go away. If your tinnitus continues, other treatments may include: ?Therapy and counseling to help you manage the stress of living with tinnitus. ?Sound generators to mask the tinnitus. These include: ?Tabletop sound machines that play relaxing sounds to help you fall asleep. ?Wearable devices that fit in your ear and play sounds or music. ?Acoustic neural stimulation. This involves using headphones to listen to music that contains an auditory signal. Over time, listening to this signal may change some pathways in your brain and make you less sensitive to tinnitus. This treatment is used for very severe cases when no other treatment is working. ?Using hearing aids or cochlear implants if your tinnitus is related to hearing loss. Hearing aids are worn in the outer ear. Cochlear implants are surgically placed in the inner ear. ?Follow these instructions at home: ?Managing symptoms ? ?  ? ?When possible, avoid being in loud places and being exposed to loud sounds. ?Wear hearing protection, such as earplugs, when you are exposed to loud noises. ?Use a white noise machine, a humidifier, or other devices to mask the sound of tinnitus. ?  Practice techniques for reducing stress, such as meditation, yoga, or deep breathing. Work with your health care provider if you need help with managing stress. ?Sleep with your head  slightly raised. This may reduce the impact of tinnitus. ?General instructions ?Do not use stimulants, such as nicotine, alcohol, or caffeine. Talk with your health care provider about other stimulants to avoid. Stimulants are substances that can make you feel alert and attentive by increasing certain activities in the body (such as heart rate and blood pressure). These substances may make tinnitus worse. ?Take over-the-counter and prescription medicines only as told by your health care provider. ?Try to get plenty of sleep each night. ?Keep all follow-up visits. This is important. ?Contact a health care provider if: ?Your tinnitus continues for 3 weeks or longer without stopping. ?You develop sudden hearing loss. ?Your symptoms get worse or do not get better with home care. ?You feel you are not able to manage the stress of living with tinnitus. ?Get help right away if: ?You develop tinnitus after a head injury. ?You have tinnitus along with any of the following: ?Dizziness. ?Nausea and vomiting. ?Loss of balance. ?Sudden, severe headache. ?Vision changes. ?Facial weakness or weakness of arms or legs. ?These symptoms may represent a serious problem that is an emergency. Do not wait to see if the symptoms will go away. Get medical help right away. Call your local emergency services (911 in the [place] Do not drive yourself to the hospital. ?Summary ?Tinnitus refers to hearing a sound when there is no actual source for that sound. This is often described as ringing in the ears. ?Symptoms may affect only one ear (unilateral) or both ears (bilateral). ?Use a white noise machine, a humidifier, or other devices to mask the sound of tinnitus. ?Do not use stimulants, such as nicotine, alcohol, or caffeine. These substances may make tinnitus worse. ?This information is not intended to replace advice given to you by your health care provider. Make sure you discuss any questions you have with your health care  provider. ?Document Revised: 03/15/2020 Document Reviewed: 03/15/2020 ?Elsevier Patient Education ? 2023 [employer] ? ?

## 2022-05-15 ENCOUNTER — Encounter (INDEPENDENT_AMBULATORY_CARE_PROVIDER_SITE_OTHER): Payer: Self-pay | Admitting: Internal Medicine

## 2022-05-15 ENCOUNTER — Ambulatory Visit (INDEPENDENT_AMBULATORY_CARE_PROVIDER_SITE_OTHER): Payer: 59 | Admitting: Internal Medicine

## 2022-05-15 VITALS — BP 120/74 | HR 65 | Temp 97.7°F | Ht 65.0 in

## 2022-05-15 DIAGNOSIS — R7303 Prediabetes: Secondary | ICD-10-CM

## 2022-05-15 DIAGNOSIS — Z6826 Body mass index (BMI) 26.0-26.9, adult: Secondary | ICD-10-CM

## 2022-05-15 DIAGNOSIS — I421 Obstructive hypertrophic cardiomyopathy: Secondary | ICD-10-CM

## 2022-05-15 DIAGNOSIS — E669 Obesity, unspecified: Secondary | ICD-10-CM

## 2022-05-15 DIAGNOSIS — I10 Essential (primary) hypertension: Secondary | ICD-10-CM

## 2022-05-15 DIAGNOSIS — E559 Vitamin D deficiency, unspecified: Secondary | ICD-10-CM | POA: Diagnosis not present

## 2022-05-15 MED ORDER — WEGOVY 1.7 MG/0.75ML ~~LOC~~ SOAJ: 1.7000 mg | SUBCUTANEOUS | 0 refills | Status: DC

## 2022-05-15 NOTE — Progress Notes (Deleted)
The 10-year ASCVD risk score (Arnett DK, et al., 2019) is: 5.9%   Values used to calculate the score:     Age: 62 years     Sex: Female     Is Non-Hispanic African American: Yes     Diabetic: No     Tobacco smoker: No     Systolic Blood Pressure: 478 mmHg     Is BP treated: Yes     HDL Cholesterol: 46 mg/dL     Total Cholesterol: 200 mg/dL

## 2022-05-15 NOTE — Progress Notes (Signed)
Chief Complaint:   OBESITY [name] is here to discuss her progress with her obesity treatment plan along with follow-up of her obesity related diagnoses. [name] is on the Category 2 Plan and states she is following her eating plan approximately 90% of the time. [name] states she is walking and lifting weights for 15 minutes 2-5 times per week.  Today's visit was #: 14 Starting weight: 202 lbs Starting date: 08/03/2021 Today's weight: 159 lbs Today's date: 05/15/2022 Total lbs lost to date: 43 Total lbs lost since last in-office visit: 6  Interim History: [name] presents today for follow-up.  Since last office visit she has lost 6 pounds.  She reports good adherence to prescribed reduced calorie plan.  She is also on [facility] for pharmacotherapy.  She reports adequate satiety and satiation.  She denies abnormal cravings or eating behaviors.  She denies side effects to the medication.  She has increased physical activity and is walking as well as doing light weights.  She reports getting adequate amount of protein for breakfast lunch and dinner denies skipping meals.  We were unable to obtain BIA information as patient was wearing leggings.  She would like to reach 160 pounds she is currently at 159.  Subjective:   1. Hypertension, essential Blood pressure is well-controlled.  She is on lisinopril without any side effects.    2. Hypertrophic obstructive cardiomyopathy (HCC) She is followed by Dr. Jens Som and needs to schedule an appointment this year.  She denies any syncopal episodes and is currently taking lisinopril.    3. Prediabetes Her most recent A1c is 5.9 and down from 6.4.  This is improved.    4. Vitamin D deficiency Most recent vitamin D levels were in the 70s.  She has been transition to low-dose vitamin D.    Assessment/Plan:   1. Hypertension, essential She will continue on current medications.  She will monitor for orthostasis while losing weight.  2. Hypertrophic  obstructive cardiomyopathy (HCC) Follow-up with cardiology.  No signs or symptoms of decompensated heart failure.  She has diastolic dysfunction grade 2 based on echo from 2020.  3. Prediabetes She will continue with weight loss therapy and Wegovy.  4. Vitamin D deficiency Recommend rechecking levels in April to make sure levels are not high.  5. Obesity, Current BMI 26.5 Patient will likely be transitioning to a maintenance phase in the near future.  We will unable to check body composition today and we emphasized the importance of getting adequate protein with her meals and to do strength training to avoid muscle loss.  We may need to increase her calories to maintain her weight goal or reduce Wegovy if medication is causing dietary restriction.  [name] was refilled at current dose for 1 month.  - Semaglutide-Weight Management (WEGOVY) 1.7 MG/0.75ML SOAJ; Inject 1.7 mg into the skin once a week.  Dispense: 3 mL; Refill: 0  [name] is currently in the action stage of change. As such, her goal is to continue with weight loss efforts. She has agreed to the Category 2 Plan.   Exercise goals: For substantial health benefits, adults should do at least 150 minutes (2 hours and 30 minutes) a week of moderate-intensity, or 75 minutes (1 hour and 15 minutes) a week of vigorous-intensity aerobic physical activity, or an equivalent combination of moderate- and vigorous-intensity aerobic activity. Aerobic activity should be performed in episodes of at least 10 minutes, and preferably, it should be spread throughout the week.  Behavioral modification  strategies: increasing lean protein intake, meal planning and cooking strategies, better snacking choices, and planning for success.  [name] has agreed to follow-up with our clinic in 4 weeks. She was informed of the importance of frequent follow-up visits to maximize her success with intensive lifestyle modifications for her multiple health conditions.    Objective:   Blood pressure 120/74, pulse 65, temperature 97.7 F (36.5 C), height 5\' 5"  (1.651 m), SpO2 100 %. Body mass index is 27.52 kg/m.  General: Cooperative, alert, well developed, in no acute distress. HEENT: Conjunctivae and lids unremarkable. Cardiovascular: Regular rhythm.  Lungs: Normal work of breathing. Neurologic: No focal deficits.   Lab Results  Component Value Date   CREATININE 1.03 (H) 03/20/2022   BUN 14 03/20/2022   NA 143 03/20/2022   K 4.2 03/20/2022   CL 101 03/20/2022   CO2 25 03/20/2022   Lab Results  Component Value Date   ALT 13 03/20/2022   AST 21 03/20/2022   ALKPHOS 91 03/20/2022   BILITOT 0.5 03/20/2022   Lab Results  Component Value Date   HGBA1C 5.9 (H) 03/20/2022   HGBA1C 6.4 (H) 08/03/2021   HGBA1C 5.9 (A) 11/02/2020   HGBA1C 5.5 02/13/2017   HGBA1C 5.9 (H) 04/19/2014   Lab Results  Component Value Date   INSULIN 19.8 03/20/2022   INSULIN 22.8 08/03/2021   Lab Results  Component Value Date   TSH 0.878 08/03/2021   Lab Results  Component Value Date   CHOL 200 (H) 03/20/2022   HDL 46 03/20/2022   LDLCALC 140 (H) 03/20/2022   TRIG 77 03/20/2022   CHOLHDL 6.9 (H) 11/02/2020   Lab Results  Component Value Date   VD25OH 74.7 03/20/2022   VD25OH 57.4 08/03/2021   VD25OH 30.7 05/13/2019   Lab Results  Component Value Date   WBC 9.8 03/20/2022   HGB 14.2 03/20/2022   HCT 45.4 03/20/2022   MCV 82 03/20/2022   PLT 265 03/20/2022   No results found for: "IRON", "TIBC", "FERRITIN"  Attestation Statements:   Reviewed by clinician on day of visit: allergies, medications, problem list, medical history, surgical history, family history, social history, and previous encounter notes.   [name], am acting as transcriptionist for Worthy Rancher, MD.  I have reviewed the above documentation for accuracy and completeness, and I agree with the above. -Worthy Rancher, MD

## 2022-05-28 ENCOUNTER — Other Ambulatory Visit: Payer: Self-pay | Admitting: Cardiology

## 2022-06-01 NOTE — Telephone Encounter (Signed)
*  STAT* If patient is at the pharmacy, call can be transferred to refill team.   1. Which medications need to be refilled? (please list name of each medication and dose if known) furosemide (LASIX) 40 MG tablet   2. Which pharmacy/location (including street and city if local pharmacy) is medication to be sent to? [id number]- [place], [place] - [address]   3. Do they need a 30 day or 90 day supply? 90 day   Patient has appt scheduled for 2/14 with APP.

## 2022-06-06 ENCOUNTER — Encounter: Payer: Self-pay | Admitting: Cardiology

## 2022-06-07 ENCOUNTER — Ambulatory Visit: Payer: 59 | Admitting: Nurse Practitioner

## 2022-06-07 NOTE — Progress Notes (Deleted)
Office Visit    Patient Name: [name] Date of Encounter: 06/07/2022  Primary Care Provider:  Kinnie Feil, MD Primary Cardiologist:  Kirk Ruths, MD  Chief Complaint    ***  Past Medical History    Past Medical History:  Diagnosis Date   Abnormal nuclear cardiac imaging test 04/22/2014   Anemia    ANXIETY 06/21/2006   tx with sertaline in 2010.  Self d/c, has been doing well since.   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   Normal coronary arteries- 2009 and Dec 2915 04/22/2014   Palpitations    SOB (shortness of breath)    SYNCOPE 01/28/2010   Qualifier: Diagnosis of  By: Stanford Breed, MD, Kandyce Rud    Vitamin D deficiency    Past Surgical History:  Procedure Laterality Date   CARDIAC CATHETERIZATION     LEFT HEART CATHETERIZATION WITH CORONARY/GRAFT ANGIOGRAM N/A 04/21/2014   Procedure: LEFT HEART CATHETERIZATION WITH Beatrix Fetters;  Surgeon: Troy Sine, MD;  Location: [facility] CATH LAB;  Service: Cardiovascular;  Laterality: N/A;   TUBAL LIGATION      Allergies  Allergies  Allergen Reactions   Codeine Phosphate Rash    itching     Labs/Other Studies Reviewed    The following studies were reviewed today: ***  Recent Labs: 08/03/2021: TSH 0.878 03/20/2022: ALT 13; BUN 14; Creatinine, Ser 1.03; Hemoglobin 14.2; Platelets 265; Potassium 4.2; Sodium 143  Recent Lipid Panel     Component Value Date/Time   CHOL 200 (H) 03/20/2022 0756   TRIG 77 03/20/2022 0756   HDL 46 03/20/2022 0756   CHOLHDL 6.9 (H) 11/02/2020 1552   CHOLHDL 4.8 04/19/2014 0725   VLDL 19 04/19/2014 0725   LDLCALC 140 (H) 03/20/2022 0756    History of Present Illness    ***  Home Medications    Current Outpatient Medications  Medication Sig Dispense Refill   aspirin 81 MG EC tablet Take 81 mg by mouth daily.       Cholecalciferol (VITAMIN D) 50 MCG (2000 UT) tablet Take 2,000 Units by mouth daily.     furosemide (LASIX) 40 MG tablet Take 1 tablet (40 mg total) by mouth daily. Please keep scheduled appointment 60 tablet 0   lisinopril (ZESTRIL) 10 MG tablet TAKE 1 TABLET BY MOUTH EVERY DAY 90 tablet 1   Omega-3 Fatty Acids (FISH OIL PO) Take 1 tablet by mouth daily.     Semaglutide-Weight Management (WEGOVY) 1.7 MG/0.75ML SOAJ Inject 1.7 mg into the skin once a week. 3 mL 0   No current facility-administered medications for this visit.     Review of Systems    ***.  All other systems reviewed and are otherwise negative except as noted above.    Physical Exam    VS:  There were  no vitals taken for this visit. , BMI There is no height or weight on file to calculate BMI.     GEN: Well nourished, well developed, in no acute distress. HEENT: normal. Neck: Supple, no JVD, carotid bruits, or masses. Cardiac: RRR, no murmurs, rubs, or gallops. No clubbing, cyanosis, edema.  Radials/DP/PT 2+ and equal bilaterally.  Respiratory:  Respirations regular and unlabored, clear to auscultation bilaterally. GI: Soft, nontender, nondistended, BS + x 4. MS: no deformity or atrophy. Skin: warm and dry, no rash. Neuro:  Strength and sensation are intact. Psych: Normal affect.  Accessory Clinical Findings    ECG personally reviewed by me today - *** - no acute changes.   Lab Results  Component Value Date   WBC 9.8 03/20/2022   HGB 14.2 03/20/2022   HCT 45.4 03/20/2022   MCV 82 03/20/2022    PLT 265 03/20/2022   Lab Results  Component Value Date   CREATININE 1.03 (H) 03/20/2022   BUN 14 03/20/2022   NA 143 03/20/2022   K 4.2 03/20/2022   CL 101 03/20/2022   CO2 25 03/20/2022   Lab Results  Component Value Date   ALT 13 03/20/2022   AST 21 03/20/2022   ALKPHOS 91 03/20/2022   BILITOT 0.5 03/20/2022   Lab Results  Component Value Date   CHOL 200 (H) 03/20/2022   HDL 46 03/20/2022   LDLCALC 140 (H) 03/20/2022   TRIG 77 03/20/2022   CHOLHDL 6.9 (H) 11/02/2020    Lab Results  Component Value Date   HGBA1C 5.9 (H) 03/20/2022    Assessment & Plan    1.  ***  No BP recorded.  {Refresh Note OR Click here to enter BP  :1}***   Lenna Sciara, NP 06/07/2022, 5:10 AM

## 2022-06-12 ENCOUNTER — Telehealth (INDEPENDENT_AMBULATORY_CARE_PROVIDER_SITE_OTHER): Payer: 59 | Admitting: Family Medicine

## 2022-06-15 ENCOUNTER — Encounter (INDEPENDENT_AMBULATORY_CARE_PROVIDER_SITE_OTHER): Payer: Self-pay | Admitting: Internal Medicine

## 2022-06-15 ENCOUNTER — Ambulatory Visit (INDEPENDENT_AMBULATORY_CARE_PROVIDER_SITE_OTHER): Payer: 59 | Admitting: Internal Medicine

## 2022-06-15 VITALS — BP 128/77 | HR 68 | Temp 97.9°F | Ht 65.0 in | Wt 156.0 lb

## 2022-06-15 DIAGNOSIS — I10 Essential (primary) hypertension: Secondary | ICD-10-CM | POA: Diagnosis not present

## 2022-06-15 DIAGNOSIS — R7303 Prediabetes: Secondary | ICD-10-CM | POA: Diagnosis not present

## 2022-06-15 DIAGNOSIS — E559 Vitamin D deficiency, unspecified: Secondary | ICD-10-CM | POA: Diagnosis not present

## 2022-06-15 DIAGNOSIS — E669 Obesity, unspecified: Secondary | ICD-10-CM

## 2022-06-15 DIAGNOSIS — Z6826 Body mass index (BMI) 26.0-26.9, adult: Secondary | ICD-10-CM

## 2022-06-15 MED ORDER — WEGOVY 1.7 MG/0.75ML ~~LOC~~ SOAJ: 1.7000 mg | SUBCUTANEOUS | 0 refills | Status: DC

## 2022-06-15 NOTE — Assessment & Plan Note (Signed)
Blood pressure at goal for age and risk category.  On lisinopril 10 mg once a day and furosemide 40 mg without adverse effects.  Most recent renal parameters reviewed which showed normal electrolytes and kidney function.  Continue with weight loss therapy.  Monitor for symptoms of orthostasis while losing weight. Continue current regimen and home monitoring for a goal blood pressure of 120/80.

## 2022-06-15 NOTE — Assessment & Plan Note (Signed)
Her last vitamin D levels were in the 70s.  She is been transition to over-the-counter supplementation with 2000 a day.  I recommend checking her vitamin D levels in 8 weeks to make sure she is not supratherapeutic

## 2022-06-15 NOTE — Progress Notes (Incomplete Revision)
Office: [phone]  /  Fax: [phone]  WEIGHT SUMMARY AND BIOMETRICS  Medical Weight Loss Height: 5' 5"$  (1.651 m) Weight: 156 lb (70.8 kg) Temp: 97.9 F (36.6 C) Pulse Rate: 68 BP: 128/77 SpO2: 99 % Fasting: No Labs: No Today's Visit #: 15 Weight at Last VIsit: 159 lb Weight Lost Since Last Visit: 3 lb  Body Fat %: 31 % Fat Mass (lbs): 48.4 lbs Muscle Mass (lbs): 102.2 lbs Total Body Water (lbs): 65.8 lbs Visceral Fat Rating : 7 Peak Weight: 220 lb Starting Date: 08/03/21 Starting Weight: 202 lb Total Weight Loss (lbs): 46 lb (20.9 kg)    HPI  Chief Complaint: OBESITY  [name] is here to discuss her progress with her obesity treatment plan. She is on the the Category 2 Plan and states she is following her eating plan approximately 50 % of the time. She states she is exercising 15 minutes 7 times per week.   Interval History:  Since last office visit she has lost 3 lbs.  Goal 150 by her birthday Doing 3 meals a day Eggs in AM, Salad with chicken for lunch, dinner salmon / [place] burgers and vegetables. Also eating orange apples and melon. ***   Pharmacotherapy: Wegovy 1.7  PHYSICAL EXAM:  Blood pressure 128/77, pulse 68, temperature 97.9 F (36.6 C), height 5' 5"$  (1.651 m), weight 156 lb (70.8 kg), SpO2 99 %. Body mass index is 25.96 kg/m.  General: She is overweight, cooperative, alert, well developed, and in no acute distress. PSYCH: Has normal mood, affect and thought process.   HEENT: EOMI, sclerae are anicteric. Lungs: Normal breathing effort, no conversational dyspnea. Extremities: No edema.  Neurologic: No gross sensory or motor deficits. No tremors or fasciculations noted.    ASSESSMENT AND PLAN  TREATMENT PLAN FOR OBESITY:  Recommended Dietary Goals  Carrole is currently in the action stage of change. As such, her goal is to continue weight management plan. She has agreed to the Category 2 Plan.  Behavioral Intervention  We discussed  the following Behavioral Modification Strategies today: increasing lean protein intake, increase water intake, think about ways to increase physical activity, and reading food labels and making healthy choices when eating convenient foods.  Additional resources provided today: NA  Recommended Physical Activity Goals  Mirabella has been advised to work up to 150 minutes of moderate intensity aerobic activity a week and strengthening exercises 2-3 times per week for cardiovascular health, weight loss maintenance and preservation of muscle mass.   She has agreed to consider stregnth training.    Pharmacotherapy We discussed various medication options to help Fatumata with her weight loss efforts and we both agreed to continue [facility]  ASSOCIATED CONDITIONS ADDRESSED TODAY  Prediabetes Assessment & Plan: Most recent A1c is  Lab Results  Component Value Date   HGBA1C 5.9 (H) 03/20/2022  Which has improved from 6.4.  Patient informed of disease state and risk of progression. This may contribute to abnormal cravings, fatigue and diabetes complications without having diabetes.   We reviewed treatment options which include weight loss of about 7 to 10% of body weight, increasing physical activity to 150 minutes a week of moderate intensity.  Continue pharmacotherapy with [id number].    Obesity, Current BMI 26.5 -     Wegovy; Inject 1.7 mg into the skin once a week.  Dispense: 3 mL; Refill: 0  Hypertension, essential Assessment & Plan: Blood pressure at goal for age and risk category.  On lisinopril 10 mg once a  day and furosemide 40 mg without adverse effects.  Most recent renal parameters reviewed which showed normal electrolytes and kidney function.  Continue with weight loss therapy.  Monitor for symptoms of orthostasis while losing weight. Continue current regimen and home monitoring for a goal blood pressure of 120/80.    Vitamin D deficiency Assessment & Plan: Her last vitamin D levels  were in the 70s.  She is been transition to over-the-counter supplementation with 2000 a day.  I recommend checking her vitamin D levels in 8 weeks to make sure she is not supratherapeutic       DIAGNOSTIC DATA REVIEWED:  BMET    Component Value Date/Time   NA 143 03/20/2022 0756   K 4.2 03/20/2022 0756   CL 101 03/20/2022 0756   CO2 25 03/20/2022 0756   GLUCOSE 87 03/20/2022 0756   GLUCOSE 105 (H) 10/30/2017 2320   BUN 14 03/20/2022 0756   CREATININE 1.03 (H) 03/20/2022 0756   CREATININE 1.01 01/22/2015 1033   CALCIUM 9.8 03/20/2022 0756   GFRNONAA 79 05/13/2019 1340   GFRAA 91 05/13/2019 1340   Lab Results  Component Value Date   HGBA1C 5.9 (H) 03/20/2022   HGBA1C 5.9 (H) 04/19/2014   Lab Results  Component Value Date   INSULIN 19.8 03/20/2022   INSULIN 22.8 08/03/2021   Lab Results  Component Value Date   TSH 0.878 08/03/2021   CBC    Component Value Date/Time   WBC 9.8 03/20/2022 0756   WBC 7.2 10/30/2017 2320   RBC 5.57 (H) 03/20/2022 0756   RBC 5.31 (H) 10/30/2017 2320   HGB 14.2 03/20/2022 0756   HCT 45.4 03/20/2022 0756   PLT 265 03/20/2022 0756   MCV 82 03/20/2022 0756   MCH 25.5 (L) 03/20/2022 0756   MCH 26.0 10/30/2017 2320   MCHC 31.3 (L) 03/20/2022 0756   MCHC 31.4 10/30/2017 2320   RDW 14.5 03/20/2022 0756   Iron Studies No results found for: "IRON", "TIBC", "FERRITIN", "IRONPCTSAT" Lipid Panel     Component Value Date/Time   CHOL 200 (H) 03/20/2022 0756   TRIG 77 03/20/2022 0756   HDL 46 03/20/2022 0756   CHOLHDL 6.9 (H) 11/02/2020 1552   CHOLHDL 4.8 04/19/2014 0725   VLDL 19 04/19/2014 0725   LDLCALC 140 (H) 03/20/2022 0756   Hepatic Function Panel     Component Value Date/Time   PROT 7.8 03/20/2022 0756   ALBUMIN 4.6 03/20/2022 0756   AST 21 03/20/2022 0756   ALT 13 03/20/2022 0756   ALKPHOS 91 03/20/2022 0756   BILITOT 0.5 03/20/2022 0756   BILIDIR 0.0 10/04/2012 0918      Component Value Date/Time   TSH 0.878  08/03/2021 0847   Nutritional Lab Results  Component Value Date   VD25OH 74.7 03/20/2022   VD25OH 57.4 08/03/2021   VD25OH 30.7 05/13/2019      Return in about 4 weeks (around 07/13/2022) for With [employer] for [employer].. She was informed of the importance of frequent follow up visits to maximize her success with intensive lifestyle modifications for her multiple health conditions.    ATTESTASTION STATEMENTS:  Reviewed by clinician on day of visit: allergies, medications, problem list, medical history, surgical history, family history, social history, and previous encounter notes.      Thomes Dinning, MD

## 2022-06-15 NOTE — Assessment & Plan Note (Signed)
Most recent A1c is  Lab Results  Component Value Date   HGBA1C 5.9 (H) 03/20/2022  Which has improved from 6.4.  Patient informed of disease state and risk of progression. This may contribute to abnormal cravings, fatigue and diabetes complications without having diabetes.   We reviewed treatment options which include weight loss of about 7 to 10% of body weight, increasing physical activity to 150 minutes a week of moderate intensity.  Continue pharmacotherapy with [id number].

## 2022-06-15 NOTE — Progress Notes (Addendum)
Office: [phone]  /  Fax: [phone]  WEIGHT SUMMARY AND BIOMETRICS  Medical Weight Loss Height: '5\' 5"'$  (1.651 m) Weight: 156 lb (70.8 kg) Temp: 97.9 F (36.6 C) Pulse Rate: 68 BP: 128/77 SpO2: 99 % Fasting: No Labs: No Today's Visit #: 15 Weight at Last VIsit: 159 lb Weight Lost Since Last Visit: 3 lb  Body Fat %: 31 % Fat Mass (lbs): 48.4 lbs Muscle Mass (lbs): 102.2 lbs Total Body Water (lbs): 65.8 lbs Visceral Fat Rating : 7 Peak Weight: 220 lb Starting Date: 08/03/21 Starting Weight: 202 lb Total Weight Loss (lbs): 46 lb (20.9 kg)    HPI  Chief Complaint: OBESITY  [name] is here to discuss her progress with her obesity treatment plan. She is on the the Category 2 Plan and states she is following her eating plan approximately 50 % of the time. She states she is exercising 15 minutes 7 times per week.   Interval History:  Since last office visit she has lost 3 lbs. adherence to prescribed reduced calorie healthy diet is fair. Denies problems with appetite and hunger signals.  Denies problems with satiety and satiation.  Denies problems with eating patterns and portion control.  Stress levels are reported as low and manageable.  Barriers identified none.  Goal to reach 150 pounds by her birthday Doing 3 meals a day 24-hour recall : eggs in AM, Salad with chicken for lunch, dinner salmon / [place] burgers and vegetables. Also eating orange apples and melon.   Pharmacotherapy: Wegovy 1.7 without any adverse effects.  PHYSICAL EXAM:  Blood pressure 128/77, pulse 68, temperature 97.9 F (36.6 C), height '5\' 5"'$  (1.651 m), weight 156 lb (70.8 kg), SpO2 99 %. Body mass index is 25.96 kg/m.  General: She is overweight, cooperative, alert, well developed, and in no acute distress. PSYCH: Has normal mood, affect and thought process.   HEENT: EOMI, sclerae are anicteric. Lungs: Normal breathing effort, no conversational dyspnea. Extremities: No edema.   Neurologic: No gross sensory or motor deficits. No tremors or fasciculations noted.    ASSESSMENT AND PLAN  TREATMENT PLAN FOR OBESITY:  Recommended Dietary Goals  [name] is currently in the action stage of change. As such, her goal is to continue weight management plan. She has agreed to the Category 2 Plan.  She would like to reach 150 pounds which is 6 more pounds and then we could work on maintenance strategy.  We also reviewed the importance of strengthening to increase muscle mass and BMR while maintaining weight loss.  Behavioral Intervention  We discussed the following Behavioral Modification Strategies today: increasing lean protein intake, increase water intake, think about ways to increase physical activity, and reading food labels and making healthy choices when eating convenient foods.  Additional resources provided today: NA  Recommended Physical Activity Goals  [name] has been advised to work up to 150 minutes of moderate intensity aerobic activity a week and strengthening exercises 2-3 times per week for cardiovascular health, weight loss maintenance and preservation of muscle mass.   She has agreed to consider stregnth training.    Pharmacotherapy We discussed various medication options to help [name] with her weight loss efforts and we both agreed to continue [facility].  ASSOCIATED CONDITIONS ADDRESSED TODAY  Prediabetes Assessment & Plan: Most recent A1c is  Lab Results  Component Value Date   HGBA1C 5.9 (H) 03/20/2022  Which has improved from 6.4.  Patient informed of disease state and risk of progression. This may contribute to  abnormal cravings, fatigue and diabetes complications without having diabetes.   We reviewed treatment options which include weight loss of about 7 to 10% of body weight, increasing physical activity to 150 minutes a week of moderate intensity.  Continue pharmacotherapy with [id number].    Obesity, Current BMI 26.5 -     Wegovy; Inject  1.7 mg into the skin once a week.  Dispense: 3 mL; Refill: 0  Hypertension, essential Assessment & Plan: Blood pressure at goal for age and risk category.  On lisinopril 10 mg once a day and furosemide 40 mg without adverse effects.  Most recent renal parameters reviewed which showed normal electrolytes and kidney function.  Continue with weight loss therapy.  Monitor for symptoms of orthostasis while losing weight. Continue current regimen and home monitoring for a goal blood pressure of 120/80.    Vitamin D deficiency Assessment & Plan: Her last vitamin D levels were in the 70s.  She is been transition to over-the-counter supplementation with 2000 a day.  I recommend checking her vitamin D levels in 8 weeks to make sure she is not supratherapeutic       DIAGNOSTIC DATA REVIEWED:  BMET    Component Value Date/Time   NA 143 03/20/2022 0756   K 4.2 03/20/2022 0756   CL 101 03/20/2022 0756   CO2 25 03/20/2022 0756   GLUCOSE 87 03/20/2022 0756   GLUCOSE 105 (H) 10/30/2017 2320   BUN 14 03/20/2022 0756   CREATININE 1.03 (H) 03/20/2022 0756   CREATININE 1.01 01/22/2015 1033   CALCIUM 9.8 03/20/2022 0756   GFRNONAA 79 05/13/2019 1340   GFRAA 91 05/13/2019 1340   Lab Results  Component Value Date   HGBA1C 5.9 (H) 03/20/2022   HGBA1C 5.9 (H) 04/19/2014   Lab Results  Component Value Date   INSULIN 19.8 03/20/2022   INSULIN 22.8 08/03/2021   Lab Results  Component Value Date   TSH 0.878 08/03/2021   CBC    Component Value Date/Time   WBC 9.8 03/20/2022 0756   WBC 7.2 10/30/2017 2320   RBC 5.57 (H) 03/20/2022 0756   RBC 5.31 (H) 10/30/2017 2320   HGB 14.2 03/20/2022 0756   HCT 45.4 03/20/2022 0756   PLT 265 03/20/2022 0756   MCV 82 03/20/2022 0756   MCH 25.5 (L) 03/20/2022 0756   MCH 26.0 10/30/2017 2320   MCHC 31.3 (L) 03/20/2022 0756   MCHC 31.4 10/30/2017 2320   RDW 14.5 03/20/2022 0756   Iron Studies No results found for: "IRON", "TIBC", "FERRITIN",  "IRONPCTSAT" Lipid Panel     Component Value Date/Time   CHOL 200 (H) 03/20/2022 0756   TRIG 77 03/20/2022 0756   HDL 46 03/20/2022 0756   CHOLHDL 6.9 (H) 11/02/2020 1552   CHOLHDL 4.8 04/19/2014 0725   VLDL 19 04/19/2014 0725   LDLCALC 140 (H) 03/20/2022 0756   Hepatic Function Panel     Component Value Date/Time   PROT 7.8 03/20/2022 0756   ALBUMIN 4.6 03/20/2022 0756   AST 21 03/20/2022 0756   ALT 13 03/20/2022 0756   ALKPHOS 91 03/20/2022 0756   BILITOT 0.5 03/20/2022 0756   BILIDIR 0.0 10/04/2012 0918      Component Value Date/Time   TSH 0.878 08/03/2021 0847   Nutritional Lab Results  Component Value Date   VD25OH 74.7 03/20/2022   VD25OH 57.4 08/03/2021   VD25OH 30.7 05/13/2019      Return in about 4 weeks (around 07/13/2022) for With [facility] for  Virtual.. She was informed of the importance of frequent follow up visits to maximize her success with intensive lifestyle modifications for her multiple health conditions.    ATTESTASTION STATEMENTS:  Reviewed by clinician on day of visit: allergies, medications, problem list, medical history, surgical history, family history, social history, and previous encounter notes.      Thomes Dinning, MD

## 2022-07-12 NOTE — Progress Notes (Signed)
  TeleHealth Visit:  This visit was completed with telemedicine (audio/video) technology. [name] has verbally consented to this TeleHealth visit. The patient is located at home, the provider is located at home. The participants in this visit include the listed provider and patient. The visit was conducted today via MyChart video.  OBESITY [name] is here to discuss her progress with her obesity treatment plan along with follow-up of her obesity related diagnoses.   Today's visit was # 16 Starting Date: 08/03/21 Starting Weight: 202 lb Weight at last in office visit: 159 lbs on 06/15/22 Total weight loss: 43 lbs at last in office visit on 06/15/22. Today's reported weight: {dwwweightreported:29243}  Nutrition Plan: the Category 2 plan - ***% adherence.  Current exercise: {exercise types:16438} exercising 15 minutes 7 times per week   Interim History:  *** Weight goal is 150 lbs.  Protein intake adequate: {yes***/no:17258} Skipping meals: {dwwyes:29172} Drinking adequate water: {dwwyes:29172} Drinking sugar sweetened beverages: {dwwyes:29172} Hunger controlled: {EWCONTROLASSESSMENT:24261}. Cravings controlled:  {EWCONTROLASSESSMENT:24261}.  Journaling Consistently:  {dwwyes:29172} Meeting protein goals:  {dwwyes:29172} Meeting calorie goals:  {dwwyes:29172}   Pharmacotherapy: [name] is on {dwwpharmacotherapy:29109} Adverse side effects: {dwwse:29122} Hunger is {EWCONTROLASSESSMENT:24261}.  Cravings are {EWCONTROLASSESSMENT:24261}.  Assessment/Plan:  1. ***  2. ***  3. ***  {dwwmorbid:29108::"Morbid Obesity"}: Current BMI *** Pharmacotherapy Plan {dwwmed:29123}  {dwwpharmacotherapy:29109} [name] {CHL AMB IS/IS NOT:210130109} currently in the action stage of change. As such, her goal is to {MWMwtloss#1:210800005}.  She has agreed to {dwwsldiets:29085}.  Exercise goals: {MWM EXERCISE RECS:23473}  Behavioral modification strategies:  {dwwslwtlossstrategies:29088}.  [name] has agreed to follow-up with our clinic in {NUMBER 1-10:22536} weeks.   No orders of the defined types were placed in this encounter.   There are no discontinued medications.   No orders of the defined types were placed in this encounter.     Objective:   VITALS: Per patient if applicable, see vitals. GENERAL: Alert and in no acute distress. CARDIOPULMONARY: No increased WOB. Speaking in clear sentences.  PSYCH: Pleasant and cooperative. Speech normal rate and rhythm. Affect is appropriate. Insight and judgement are appropriate. Attention is focused, linear, and appropriate.  NEURO: Oriented as arrived to appointment on time with no prompting.   Attestation Statements:   Reviewed by clinician on day of visit: allergies, medications, problem list, medical history, surgical history, family history, social history, and previous encounter notes.  ***(delete if time-based billing not used) Time spent on visit including the items listed below was *** minutes.  -preparing to see the patient (e.g., review of tests, history, previous notes) -obtaining and/or reviewing separately obtained history -counseling and educating the patient/family/caregiver -documenting clinical information in the electronic or other health record -ordering medications, tests, or procedures -independently interpreting results and communicating results to the patient/ family/caregiver -referring and communicating with other health care professionals  -care coordination   This was prepared with the assistance of Presenter, broadcasting.  Occasional wrong-word or sound-a-like substitutions may have occurred due to the inherent limitations of voice recognition software.

## 2022-07-13 ENCOUNTER — Encounter (INDEPENDENT_AMBULATORY_CARE_PROVIDER_SITE_OTHER): Payer: Self-pay | Admitting: Family Medicine

## 2022-07-13 ENCOUNTER — Telehealth (INDEPENDENT_AMBULATORY_CARE_PROVIDER_SITE_OTHER): Payer: 59 | Admitting: Family Medicine

## 2022-07-13 DIAGNOSIS — R7303 Prediabetes: Secondary | ICD-10-CM | POA: Diagnosis not present

## 2022-07-13 DIAGNOSIS — Z6825 Body mass index (BMI) 25.0-25.9, adult: Secondary | ICD-10-CM

## 2022-07-13 DIAGNOSIS — I10 Essential (primary) hypertension: Secondary | ICD-10-CM | POA: Diagnosis not present

## 2022-07-13 DIAGNOSIS — E669 Obesity, unspecified: Secondary | ICD-10-CM

## 2022-07-13 MED ORDER — WEGOVY 1.7 MG/0.75ML ~~LOC~~ SOAJ: 1.7000 mg | SUBCUTANEOUS | 0 refills | Status: DC

## 2022-08-10 ENCOUNTER — Ambulatory Visit (INDEPENDENT_AMBULATORY_CARE_PROVIDER_SITE_OTHER): Payer: 59 | Admitting: Internal Medicine

## 2022-08-10 ENCOUNTER — Encounter (INDEPENDENT_AMBULATORY_CARE_PROVIDER_SITE_OTHER): Payer: Self-pay | Admitting: Internal Medicine

## 2022-08-10 VITALS — BP 119/73 | HR 68 | Temp 98.3°F | Ht 65.0 in | Wt 156.0 lb

## 2022-08-10 DIAGNOSIS — Z6833 Body mass index (BMI) 33.0-33.9, adult: Secondary | ICD-10-CM | POA: Diagnosis not present

## 2022-08-10 DIAGNOSIS — R7303 Prediabetes: Secondary | ICD-10-CM

## 2022-08-10 DIAGNOSIS — I10 Essential (primary) hypertension: Secondary | ICD-10-CM | POA: Diagnosis not present

## 2022-08-10 DIAGNOSIS — E669 Obesity, unspecified: Secondary | ICD-10-CM

## 2022-08-10 MED ORDER — WEGOVY 1.7 MG/0.75ML ~~LOC~~ SOAJ: 1.7000 mg | SUBCUTANEOUS | 0 refills | Status: DC

## 2022-08-10 NOTE — Progress Notes (Signed)
Office: [phone]  /  Fax: [phone]  WEIGHT SUMMARY AND BIOMETRICS  Vitals Temp: 98.3 F (36.8 C) BP: 119/73 Pulse Rate: 68 SpO2: 98 %   Anthropometric Measurements Height: 5\' 5"  (1.651 m) Weight: 156 lb (70.8 kg) BMI (Calculated): 25.96 Weight at Last Visit: 156 lb Weight Lost Since Last Visit: 0 lb Weight Gained Since Last Visit: 0 lb Starting Weight: 202 lb Total Weight Loss (lbs): 46 lb (20.9 kg) Peak Weight: 220 lb   Body Composition  Body Fat %: 29 % Fat Mass (lbs): 45.2 lbs Muscle Mass (lbs): 105.2 lbs Total Body Water (lbs): 65.2 lbs Visceral Fat Rating : 7    No data recorded Today's Visit #: 16  Starting Date: 08/03/21   HPI  Chief Complaint: OBESITY  [name] is here to discuss her progress with her obesity treatment plan. She is on the the Category 2 Plan and states she is following her eating plan approximately 100 % of the time. She states she is exercising 15 minutes 4 times per week.  Interval History:  Since last office visit she has maintained weight. BIA shows an increase in muscle mass and decrease in body fat. Had cataract surgery recently and has not been able to do strengthening exercises She reports good adherence to reduced calorie nutritional plan. She has been working on not skipping meals, increasing protein intake at every meal, eating more fruits, eating more vegetables, drinking more water, making healthier choices, and mindfulness around eating Denies problems with appetite and hunger signals.  Denies problems with satiety and satiation.  Denies problems with eating patterns and portion control.  Denies abnormal cravings. Denies feeling deprived or restricted.   Barriers identified: none.   Pharmacotherapy for weight loss: She is currently taking Wegovy.    ASSESSMENT AND PLAN  TREATMENT PLAN FOR OBESITY:  Recommended Dietary Goals  [name] is currently in the action stage of change. As such, her goal is to  continue weight management plan. She has agreed to: continue current plan  Behavioral Intervention  We discussed the following Behavioral Modification Strategies today: increasing lean protein intake, decreasing simple carbohydrates , increasing vegetables, increasing lower glycemic fruits, increasing water intake, continue to practice mindfulness when eating, and planning for success.  Additional resources provided today: None  Recommended Physical Activity Goals  [name] has been advised to work up to 150 minutes of moderate intensity aerobic activity a week and strengthening exercises 2-3 times per week for cardiovascular health, weight loss maintenance and preservation of muscle mass.   She has agreed to :  Increase the intensity, frequency or duration of strengthening exercises , Increase the intensity, frequency or duration of aerobic exercises  , and once clear by eye doctor  Pharmacotherapy We discussed various medication options to help [name] with her weight loss efforts and we both agreed to : continue current anti-obesity medication regimen  ASSOCIATED CONDITIONS ADDRESSED TODAY  Hypertension, essential Assessment & Plan: Blood pressure at goal for age and risk category.  On lisinopril 10 mg once a day and furosemide 40 mg without adverse effects.  Most recent renal parameters reviewed which showed normal electrolytes and kidney function.  Continue with weight loss therapy.  Monitor for symptoms of orthostasis while losing weight. Continue current regimen and home monitoring for a goal blood pressure of 120/80.    Generalized obesity: Starting BMI 33.6 Assessment & Plan: Her BMI is now at 26, with a body fat percentage of 29% and visceral fat rating of 7% which  suggest significant improvements in body composition beyond weight loss.  She would like to reach 150 pounds before working on a maintenance strategy.  She will continue on incretin therapy.  We again emphasized the  importance of strengthening exercises which she will resume once cleared by ophthalmology.  Orders: -     Wegovy; Inject 1.7 mg into the skin once a week.  Dispense: 3 mL; Refill: 0  Prediabetes Assessment & Plan: Most recent A1c is  Lab Results  Component Value Date   HGBA1C 5.9 (H) 03/20/2022  Which has improved from 6.4.  Patient informed of disease state and risk of progression. This may contribute to abnormal cravings, fatigue and diabetes complications without having diabetes.   She will continue with weight loss therapy.  She will continue on incretin therapy.  We will check hemoglobin A1c and rest of her labs in June.       PHYSICAL EXAM:  Blood pressure 119/73, pulse 68, temperature 98.3 F (36.8 C), height 5\' 5"  (1.651 m), weight 156 lb (70.8 kg), SpO2 98 %. Body mass index is 25.96 kg/m.  General: She is overweight, cooperative, alert, well developed, and in no acute distress. PSYCH: Has normal mood, affect and thought process.   HEENT: EOMI, sclerae are anicteric. Lungs: Normal breathing effort, no conversational dyspnea. Extremities: No edema.  Neurologic: No gross sensory or motor deficits. No tremors or fasciculations noted.    DIAGNOSTIC DATA REVIEWED:  BMET    Component Value Date/Time   NA 143 03/20/2022 0756   K 4.2 03/20/2022 0756   CL 101 03/20/2022 0756   CO2 25 03/20/2022 0756   GLUCOSE 87 03/20/2022 0756   GLUCOSE 105 (H) 10/30/2017 2320   BUN 14 03/20/2022 0756   CREATININE 1.03 (H) 03/20/2022 0756   CREATININE 1.01 01/22/2015 1033   CALCIUM 9.8 03/20/2022 0756   GFRNONAA 79 05/13/2019 1340   GFRAA 91 05/13/2019 1340   Lab Results  Component Value Date   HGBA1C 5.9 (H) 03/20/2022   HGBA1C 5.9 (H) 04/19/2014   Lab Results  Component Value Date   INSULIN 19.8 03/20/2022   INSULIN 22.8 08/03/2021   Lab Results  Component Value Date   TSH 0.878 08/03/2021   CBC    Component Value Date/Time   WBC 9.8 03/20/2022 0756   WBC 7.2  10/30/2017 2320   RBC 5.57 (H) 03/20/2022 0756   RBC 5.31 (H) 10/30/2017 2320   HGB 14.2 03/20/2022 0756   HCT 45.4 03/20/2022 0756   PLT 265 03/20/2022 0756   MCV 82 03/20/2022 0756   MCH 25.5 (L) 03/20/2022 0756   MCH 26.0 10/30/2017 2320   MCHC 31.3 (L) 03/20/2022 0756   MCHC 31.4 10/30/2017 2320   RDW 14.5 03/20/2022 0756   Iron Studies No results found for: "IRON", "TIBC", "FERRITIN", "IRONPCTSAT" Lipid Panel     Component Value Date/Time   CHOL 200 (H) 03/20/2022 0756   TRIG 77 03/20/2022 0756   HDL 46 03/20/2022 0756   CHOLHDL 6.9 (H) 11/02/2020 1552   CHOLHDL 4.8 04/19/2014 0725   VLDL 19 04/19/2014 0725   LDLCALC 140 (H) 03/20/2022 0756   Hepatic Function Panel     Component Value Date/Time   PROT 7.8 03/20/2022 0756   ALBUMIN 4.6 03/20/2022 0756   AST 21 03/20/2022 0756   ALT 13 03/20/2022 0756   ALKPHOS 91 03/20/2022 0756   BILITOT 0.5 03/20/2022 0756   BILIDIR 0.0 10/04/2012 0918      Component Value Date/Time  TSH 0.878 08/03/2021 0847   Nutritional Lab Results  Component Value Date   VD25OH 74.7 03/20/2022   VD25OH 57.4 08/03/2021   VD25OH 30.7 05/13/2019     Return for For Weight Mangement with Dr. Rikki Spearing.Marland Kitchen She was informed of the importance of frequent follow up visits to maximize her success with intensive lifestyle modifications for her multiple health conditions.   ATTESTASTION STATEMENTS:  Reviewed by clinician on day of visit: allergies, medications, problem list, medical history, surgical history, family history, social history, and previous encounter notes.     Worthy Rancher, MD

## 2022-08-10 NOTE — Assessment & Plan Note (Signed)
She had an elevated LDL in the past. The 10-year ASCVD risk score (Arnett DK, et al., 2019) is: 6.1% and may benefit from statin therapy for cardiovascular risk reduction.  Her blood pressure is well-controlled.  We will check fasting lipid profile in June and reassess cardiovascular risk.

## 2022-08-10 NOTE — Assessment & Plan Note (Signed)
Most recent A1c is  Lab Results  Component Value Date   HGBA1C 5.9 (H) 03/20/2022  Which has improved from 6.4.  Patient informed of disease state and risk of progression. This may contribute to abnormal cravings, fatigue and diabetes complications without having diabetes.   She will continue with weight loss therapy.  She will continue on incretin therapy.  We will check hemoglobin A1c and rest of her labs in June.

## 2022-08-10 NOTE — Assessment & Plan Note (Signed)
Blood pressure at goal for age and risk category.  On lisinopril 10 mg once a day and furosemide 40 mg without adverse effects.  Most recent renal parameters reviewed which showed normal electrolytes and kidney function.  Continue with weight loss therapy.  Monitor for symptoms of orthostasis while losing weight. Continue current regimen and home monitoring for a goal blood pressure of 120/80.  

## 2022-08-10 NOTE — Assessment & Plan Note (Signed)
Her BMI is now at 26, with a body fat percentage of 29% and visceral fat rating of 7% which suggest significant improvements in body composition beyond weight loss.  She would like to reach 150 pounds before working on a maintenance strategy.  She will continue on incretin therapy.  We again emphasized the importance of strengthening exercises which she will resume once cleared by ophthalmology.

## 2022-08-19 ENCOUNTER — Other Ambulatory Visit: Payer: Self-pay | Admitting: Cardiology

## 2022-09-06 NOTE — Progress Notes (Signed)
TeleHealth Visit:  This visit was completed with telemedicine (audio/video) technology. [name] has verbally consented to this TeleHealth visit. The patient is located at home, the provider is located at home. The participants in this visit include the listed provider and patient. The visit was conducted today via MyChart video.  [name] is here to discuss her progress with her Alyssa treatment plan along with follow-up of her Alyssa related diagnoses.   Today's visit was # 17 Starting weight: 202 lbs Starting date: 08/03/21 Weight at last in office visit: 156 lbs on 08/10/22 Total weight loss: 46 lbs at last in office visit on 08/10/22. Today's reported weight (09/07/22): 153-155 lbs  [name] is 150 lbs  Nutrition Plan: the Category 2 plan  Current exercise:  video (weights/cardio) 30 minutes 6 times per week.   Interim History:  She notes some fluid rentention. See note below. She has changed her weight goal to 150 lbs (24 BMI) She is adhering to category 2 extremely well.  She evens follows it she she eats out.  Protein and water intake are good (91 oz per day). She would like to watch her carbohydrates. She has increased duration and frequency of her exercise and added more resistance training.  Had 2 pound muscle increase last visit.  She is taking a [place] cruise for 7 days July 20.  Pharmacotherapy: Randal is on Wegovy 1.7 SQ weekly Adverse side effects: None Hunger is well controlled.  Cravings are well controlled.  Assessment/Plan:  1. CHF Notes more edema in legs and hands the past few weeks.  She increased her water intake to 91 ounces about 3 to 4 weeks ago.  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  No complaints of shortness of breath. She always keeps sodium intake low.  Plan: Reduce fluid to 64 oz per day. Continue to limit sodium intake. If edema does not improve after reducing water intake, schedule follow-up with Dr.  Jens Som.  2. Prediabetes Last A1c was 5.9 on 03/20/2022. Medication(s): Wegovy 1.7 SQ weekly Lab Results  Component Value Date   HGBA1C 5.9 (H) 03/20/2022   HGBA1C 6.4 (H) 08/03/2021   HGBA1C 5.9 (A) 11/02/2020   HGBA1C 5.5 02/13/2017   HGBA1C 5.9 (H) 04/19/2014   Lab Results  Component Value Date   INSULIN 19.8 03/20/2022   INSULIN 22.8 08/03/2021    Plan: Continue Wegovy 1.7 SQ weekly She would like to reduce her carbs and I told her to keep carbs less than 100 g /day.   3. Generalized Alyssa: Current BMI 25  Pharmacotherapy Plan Continue and refill  Wegovy 1.7 SQ weekly  [name] is currently in the action stage of change. As such, her goal is to continue with weight loss efforts.  She has agreed to the Category 2 plan.  Exercise goals:  as is  Behavioral modification strategies: increasing lean protein intake, decreasing simple carbohydrates , and planning for success.  Dayshawna has agreed to follow-up with our clinic in 4 weeks.   No orders of the defined types were placed in this encounter.   Medications Discontinued During This Encounter  Medication Reason   Semaglutide-Weight Management (WEGOVY) 1.7 MG/0.75ML SOAJ Reorder     Meds ordered this encounter  Medications   Semaglutide-Weight Management (WEGOVY) 1.7 MG/0.75ML SOAJ    Sig: Inject 1.7 mg into the skin once a week.    Dispense:  3 mL    Refill:  0    Order Specific Question:   Supervising Provider  Answer:   Glennis Brink [[id number]]      Objective:   VITALS: Per patient if applicable, see vitals. GENERAL: Alert and in no acute distress. CARDIOPULMONARY: No increased WOB. Speaking in clear sentences.  PSYCH: Pleasant and cooperative. Speech normal rate and rhythm. Affect is appropriate. Insight and judgement are appropriate. Attention is focused, linear, and appropriate.  NEURO: Oriented as arrived to appointment on time with no prompting.   Attestation Statements:   Reviewed by clinician on  day of visit: allergies, medications, problem list, medical history, surgical history, family history, social history, and previous encounter notes.  This was prepared with the assistance of Engineer, civil (consulting).  Occasional wrong-word or sound-a-like substitutions may have occurred due to the inherent limitations of voice recognition software.

## 2022-09-07 ENCOUNTER — Encounter (INDEPENDENT_AMBULATORY_CARE_PROVIDER_SITE_OTHER): Payer: Self-pay | Admitting: Family Medicine

## 2022-09-07 ENCOUNTER — Telehealth (INDEPENDENT_AMBULATORY_CARE_PROVIDER_SITE_OTHER): Payer: 59 | Admitting: Family Medicine

## 2022-09-07 DIAGNOSIS — R7303 Prediabetes: Secondary | ICD-10-CM | POA: Diagnosis not present

## 2022-09-07 DIAGNOSIS — I509 Heart failure, unspecified: Secondary | ICD-10-CM

## 2022-09-07 DIAGNOSIS — Z6825 Body mass index (BMI) 25.0-25.9, adult: Secondary | ICD-10-CM

## 2022-09-07 DIAGNOSIS — E669 Obesity, unspecified: Secondary | ICD-10-CM

## 2022-09-07 MED ORDER — WEGOVY 1.7 MG/0.75ML ~~LOC~~ SOAJ
1.7000 mg | SUBCUTANEOUS | 0 refills | Status: DC
Start: 2022-09-07 — End: 2022-10-05

## 2022-09-12 ENCOUNTER — Other Ambulatory Visit: Payer: Self-pay | Admitting: Cardiology

## 2022-10-05 ENCOUNTER — Ambulatory Visit (INDEPENDENT_AMBULATORY_CARE_PROVIDER_SITE_OTHER): Payer: 59 | Admitting: Internal Medicine

## 2022-10-05 ENCOUNTER — Other Ambulatory Visit: Payer: Self-pay

## 2022-10-05 ENCOUNTER — Encounter (INDEPENDENT_AMBULATORY_CARE_PROVIDER_SITE_OTHER): Payer: Self-pay | Admitting: Internal Medicine

## 2022-10-05 VITALS — BP 107/68 | HR 69 | Temp 98.1°F | Ht 65.0 in | Wt 155.0 lb

## 2022-10-05 DIAGNOSIS — E668 Other obesity: Secondary | ICD-10-CM | POA: Diagnosis not present

## 2022-10-05 DIAGNOSIS — R7303 Prediabetes: Secondary | ICD-10-CM | POA: Diagnosis not present

## 2022-10-05 DIAGNOSIS — I10 Essential (primary) hypertension: Secondary | ICD-10-CM | POA: Diagnosis not present

## 2022-10-05 DIAGNOSIS — Z6833 Body mass index (BMI) 33.0-33.9, adult: Secondary | ICD-10-CM

## 2022-10-05 DIAGNOSIS — E669 Obesity, unspecified: Secondary | ICD-10-CM

## 2022-10-05 DIAGNOSIS — I421 Obstructive hypertrophic cardiomyopathy: Secondary | ICD-10-CM | POA: Diagnosis not present

## 2022-10-05 MED ORDER — WEGOVY 1.7 MG/0.75ML ~~LOC~~ SOAJ
1.7000 mg | SUBCUTANEOUS | 0 refills | Status: DC
Start: 2022-10-05 — End: 2022-11-01

## 2022-10-05 MED ORDER — LISINOPRIL 10 MG PO TABS: 10.0000 mg | ORAL_TABLET | Freq: Every day | ORAL | 1 refills | Status: DC

## 2022-10-05 NOTE — Assessment & Plan Note (Signed)
Blood pressure at goal for age and risk category.  On lisinopril 10 mg once a day and furosemide 40 mg without adverse effects.  Most recent renal parameters reviewed which showed normal electrolytes and kidney function.  Continue with weight loss therapy.  Monitor for symptoms of orthostasis while losing weight. Continue current regimen and home monitoring for a goal blood pressure of 120/80.  

## 2022-10-05 NOTE — Assessment & Plan Note (Signed)
Most recent A1c is  Lab Results  Component Value Date   HGBA1C 5.9 (H) 03/20/2022  Which has improved from 6.4.  Patient informed of disease state and risk of progression. This may contribute to abnormal cravings, fatigue and diabetes complications without having diabetes.   She will continue with weight loss therapy.  She will continue on incretin therapy.  We will check hemoglobin A1c and rest of her labs in July

## 2022-10-05 NOTE — Progress Notes (Signed)
Office: [phone]  /  Fax: [phone]  WEIGHT SUMMARY AND BIOMETRICS  Vitals Temp: 98.1 F (36.7 C) BP: 107/68 Pulse Rate: 69 SpO2: 99 %   Anthropometric Measurements Height: 5\' 5"  (1.651 m) Weight: 155 lb (70.3 kg) BMI (Calculated): 25.79 Weight at Last Visit: 156 lb Weight Lost Since Last Visit: 1 lb Starting Weight: 202 lb Total Weight Loss (lbs): 47 lb (21.3 kg) Peak Weight: 220 lb   Body Composition  Body Fat %: 29.2 % Fat Mass (lbs): 29.9 lbs Muscle Mass (lbs): 103.2 lbs Total Body Water (lbs): 64.2 lbs Visceral Fat Rating : 7    No data recorded Today's Visit #: 17  Starting Date: 08/03/21   HPI  Chief Complaint: OBESITY  [name] is here to discuss her progress with her obesity treatment plan. She is on the the Category 2 Plan and states she is following her eating plan approximately 80 % of the time. She states she is exercising 30 minutes 5 times per week.  Interval History:  Since last office visit she has lost 1 lbs. She reports good adherence to reduced calorie nutritional plan. She has been working on not skipping meals, increasing protein intake at every meal, eating more fruits, eating more vegetables, drinking more water, making healthier choices, and continues to exercise Denies problems with appetite and hunger signals.  Denies problems with satiety and satiation.  Denies problems with eating patterns and portion control.  Denies abnormal cravings. Denies feeling deprived or restricted.   Barriers identified: none.   Pharmacotherapy for weight loss: She is currently taking Wegovy.    ASSESSMENT AND PLAN  TREATMENT PLAN FOR OBESITY:  Recommended Dietary Goals  [name] is currently in the action stage of change. As such, her goal is to continue weight management plan. She has agreed to: continue current plan  Behavioral Intervention  We discussed the following Behavioral Modification Strategies today: increasing lean protein  intake, decreasing simple carbohydrates , increasing vegetables, increasing lower glycemic fruits, increasing fiber rich foods, increasing water intake, continue to practice mindfulness when eating, and planning for success.  Additional resources provided today: None  Recommended Physical Activity Goals  Saba has been advised to work up to 150 minutes of moderate intensity aerobic activity a week and strengthening exercises 2-3 times per week for cardiovascular health, weight loss maintenance and preservation of muscle mass.   She has agreed to :  Increase the intensity, frequency or duration of strengthening exercises  and Increase the intensity, frequency or duration of aerobic exercises    Pharmacotherapy We discussed various medication options to help Chanele with her weight loss efforts and we both agreed to : continue current anti-obesity medication regimen  ASSOCIATED CONDITIONS ADDRESSED TODAY  Prediabetes Assessment & Plan: Most recent A1c is  Lab Results  Component Value Date   HGBA1C 5.9 (H) 03/20/2022  Which has improved from 6.4.  Patient informed of disease state and risk of progression. This may contribute to abnormal cravings, fatigue and diabetes complications without having diabetes.   She will continue with weight loss therapy.  She will continue on incretin therapy.  We will check hemoglobin A1c and rest of her labs in July    Hypertension, essential Assessment & Plan: Blood pressure at goal for age and risk category.  On lisinopril 10 mg once a day and furosemide 40 mg without adverse effects.  Most recent renal parameters reviewed which showed normal electrolytes and kidney function.  Continue with weight loss therapy.  Monitor for  symptoms of orthostasis while losing weight. Continue current regimen and home monitoring for a goal blood pressure of 120/80.    Hypertrophic obstructive cardiomyopathy (HCC) Assessment & Plan: Currently asymptomatic.  She is  followed by cardiology and denies any symptoms of syncope or near syncope.  She is on lisinopril and uses furosemide as needed.  She will monitor for worsening symptoms while increasing physical activity.   Class 1 obesity with serious comorbidity and body mass index (BMI) of 33.0 to 33.9 in adult, unspecified obesity type -     XBMWUX; Inject 1.7 mg into the skin once a week.  Dispense: 3 mL; Refill: 0    PHYSICAL EXAM:  Blood pressure 107/68, pulse 69, temperature 98.1 F (36.7 C), height 5\' 5"  (1.651 m), weight 155 lb (70.3 kg), SpO2 99 %. Body mass index is 25.79 kg/m.  General: She is overweight, cooperative, alert, well developed, and in no acute distress. PSYCH: Has normal mood, affect and thought process.   HEENT: EOMI, sclerae are anicteric. Lungs: Normal breathing effort, no conversational dyspnea. Extremities: No edema.  Neurologic: No gross sensory or motor deficits. No tremors or fasciculations noted.    DIAGNOSTIC DATA REVIEWED:  BMET    Component Value Date/Time   NA 143 03/20/2022 0756   K 4.2 03/20/2022 0756   CL 101 03/20/2022 0756   CO2 25 03/20/2022 0756   GLUCOSE 87 03/20/2022 0756   GLUCOSE 105 (H) 10/30/2017 2320   BUN 14 03/20/2022 0756   CREATININE 1.03 (H) 03/20/2022 0756   CREATININE 1.01 01/22/2015 1033   CALCIUM 9.8 03/20/2022 0756   GFRNONAA 79 05/13/2019 1340   GFRAA 91 05/13/2019 1340   Lab Results  Component Value Date   HGBA1C 5.9 (H) 03/20/2022   HGBA1C 5.9 (H) 04/19/2014   Lab Results  Component Value Date   INSULIN 19.8 03/20/2022   INSULIN 22.8 08/03/2021   Lab Results  Component Value Date   TSH 0.878 08/03/2021   CBC    Component Value Date/Time   WBC 9.8 03/20/2022 0756   WBC 7.2 10/30/2017 2320   RBC 5.57 (H) 03/20/2022 0756   RBC 5.31 (H) 10/30/2017 2320   HGB 14.2 03/20/2022 0756   HCT 45.4 03/20/2022 0756   PLT 265 03/20/2022 0756   MCV 82 03/20/2022 0756   MCH 25.5 (L) 03/20/2022 0756   MCH 26.0 10/30/2017  2320   MCHC 31.3 (L) 03/20/2022 0756   MCHC 31.4 10/30/2017 2320   RDW 14.5 03/20/2022 0756   Iron Studies No results found for: "IRON", "TIBC", "FERRITIN", "IRONPCTSAT" Lipid Panel     Component Value Date/Time   CHOL 200 (H) 03/20/2022 0756   TRIG 77 03/20/2022 0756   HDL 46 03/20/2022 0756   CHOLHDL 6.9 (H) 11/02/2020 1552   CHOLHDL 4.8 04/19/2014 0725   VLDL 19 04/19/2014 0725   LDLCALC 140 (H) 03/20/2022 0756   Hepatic Function Panel     Component Value Date/Time   PROT 7.8 03/20/2022 0756   ALBUMIN 4.6 03/20/2022 0756   AST 21 03/20/2022 0756   ALT 13 03/20/2022 0756   ALKPHOS 91 03/20/2022 0756   BILITOT 0.5 03/20/2022 0756   BILIDIR 0.0 10/04/2012 0918      Component Value Date/Time   TSH 0.878 08/03/2021 0847   Nutritional Lab Results  Component Value Date   VD25OH 74.7 03/20/2022   VD25OH 57.4 08/03/2021   VD25OH 30.7 05/13/2019     Return in about 4 weeks (around 11/02/2022) for For  Weight Mangement with Dr. Rikki Spearing.Marland Kitchen She was informed of the importance of frequent follow up visits to maximize her success with intensive lifestyle modifications for her multiple health conditions.   ATTESTASTION STATEMENTS:  Reviewed by clinician on day of visit: allergies, medications, problem list, medical history, surgical history, family history, social history, and previous encounter notes.     Worthy Rancher, MD

## 2022-10-05 NOTE — Assessment & Plan Note (Signed)
Currently asymptomatic.  She is followed by cardiology and denies any symptoms of syncope or near syncope.  She is on lisinopril and uses furosemide as needed.  She will monitor for worsening symptoms while increasing physical activity.

## 2022-10-12 ENCOUNTER — Other Ambulatory Visit: Payer: Self-pay | Admitting: Cardiology

## 2022-10-12 NOTE — Telephone Encounter (Signed)
Rx(s) sent to pharmacy electronically.  

## 2022-11-02 ENCOUNTER — Ambulatory Visit (INDEPENDENT_AMBULATORY_CARE_PROVIDER_SITE_OTHER): Payer: 59 | Admitting: Internal Medicine

## 2022-11-02 ENCOUNTER — Encounter (INDEPENDENT_AMBULATORY_CARE_PROVIDER_SITE_OTHER): Payer: Self-pay | Admitting: Internal Medicine

## 2022-11-02 VITALS — BP 108/69 | HR 65 | Temp 97.8°F | Ht 65.0 in | Wt 155.0 lb

## 2022-11-02 DIAGNOSIS — E668 Other obesity: Secondary | ICD-10-CM | POA: Diagnosis not present

## 2022-11-02 DIAGNOSIS — E66811 Obesity, class 1: Secondary | ICD-10-CM

## 2022-11-02 DIAGNOSIS — E669 Obesity, unspecified: Secondary | ICD-10-CM

## 2022-11-02 DIAGNOSIS — I10 Essential (primary) hypertension: Secondary | ICD-10-CM | POA: Diagnosis not present

## 2022-11-02 DIAGNOSIS — R7303 Prediabetes: Secondary | ICD-10-CM

## 2022-11-02 DIAGNOSIS — Z6825 Body mass index (BMI) 25.0-25.9, adult: Secondary | ICD-10-CM

## 2022-11-02 MED ORDER — WEGOVY 1.7 MG/0.75ML ~~LOC~~ SOAJ
1.7000 mg | SUBCUTANEOUS | 0 refills | Status: DC
Start: 2022-11-02 — End: 2022-12-07

## 2022-11-02 NOTE — Progress Notes (Signed)
Office: [phone]  /  Fax: [phone]  WEIGHT SUMMARY AND BIOMETRICS  Vitals Temp: 97.8 F (36.6 C) BP: 108/69 Pulse Rate: 65 SpO2: 99 %   Anthropometric Measurements Height: 5\' 5"  (1.651 m) Weight: 155 lb (70.3 kg) BMI (Calculated): 25.79 Weight at Last Visit: 155 lb Weight Lost Since Last Visit: 0 Weight Gained Since Last Visit: 0 Starting Weight: 202 lb Total Weight Loss (lbs): 47 lb (21.3 kg) Peak Weight: 220 lb   Body Composition  Body Fat %: 30.2 % Fat Mass (lbs): 47 lbs Muscle Mass (lbs): 103 lbs Total Body Water (lbs): 64.6 lbs Visceral Fat Rating : 7    No data recorded Today's Visit #: 18  Starting Date: 08/03/21   HPI  Chief Complaint: OBESITY  [name] is here to discuss her progress with her obesity treatment plan. She is on the the Category 2 Plan and states she is following her eating plan approximately 100 % of the time. She states she is exercising walking, pilates, 30 minutes 6 times per week.  Interval History:  Since last office visit she has maintained.  She will be going on a cruise.  In care She reports good adherence to reduced calorie nutritional plan. She has been working on not skipping meals, increasing protein intake at every meal, eating more fruits, eating more vegetables, drinking more water, making healthier choices, and continues to exercise  Orixegenic Control: Denies problems with appetite and hunger signals.  Denies problems with satiety and satiation.  Denies problems with eating patterns and portion control.  Denies abnormal cravings. Denies feeling deprived or restricted.   Barriers identified:  Diet fatigue .   Pharmacotherapy for weight loss: She is currently taking Wegovy with adequate clinical response  and without side effects..    ASSESSMENT AND PLAN  TREATMENT PLAN FOR OBESITY:  Recommended Dietary Goals  Emiline is currently in the action stage of change. As such, her goal is to continue weight  management plan. She has agreed to: continue current plan and may use meal replacement for 1 meal a day to reduce calories further.  I also provided her with information about prepackaged food services for variety and how to make a protein smoothie.  Behavioral Intervention  We discussed the following Behavioral Modification Strategies today: increasing lean protein intake, decreasing simple carbohydrates , increasing vegetables, increasing lower glycemic fruits, avoiding skipping meals, increasing water intake, continue to practice mindfulness when eating, planning for success, and staying on track while traveling and vacationing.  Additional resources provided today:  Handout on protein smoothies and prepackaged services  Recommended Physical Activity Goals  Tenea has been advised to work up to 150 minutes of moderate intensity aerobic activity a week and strengthening exercises 2-3 times per week for cardiovascular health, weight loss maintenance and preservation of muscle mass.   She has agreed to :  She we will work on increasing volume of physical activity to increase calorie deficit.  Pharmacotherapy We discussed various medication options to help Kanani with her weight loss efforts and we both agreed to : continue current anti-obesity medication regimen  ASSOCIATED CONDITIONS ADDRESSED TODAY  Prediabetes Assessment & Plan: Most recent A1c is  Lab Results  Component Value Date   HGBA1C 5.9 (H) 03/20/2022  Which has improved from 6.4.  Patient informed of disease state and risk of progression. This may contribute to abnormal cravings, fatigue and diabetes complications without having diabetes.   She will continue with weight loss therapy.  She will continue  on incretin therapy.  We will check hemoglobin A1c and rest of her labs in August    Class 1 obesity with serious comorbidity and body mass index (BMI) of 33.0 to 33.9 in adult, unspecified obesity type -     ZOXWRU; Inject  1.7 mg into the skin once a week.  Dispense: 3 mL; Refill: 0  Hypertension, essential Assessment & Plan: Blood pressure at goal for age and risk category.  On lisinopril 10 mg once a day and furosemide 40 mg without adverse effects.  Most recent renal parameters reviewed which showed normal electrolytes and kidney function.  Continue with weight loss therapy.  Monitor for symptoms of orthostasis while losing weight. Continue current regimen and home monitoring for a goal blood pressure of 120/80.      PHYSICAL EXAM:  Blood pressure 108/69, pulse 65, temperature 97.8 F (36.6 C), height 5\' 5"  (1.651 m), weight 155 lb (70.3 kg), SpO2 99%. Body mass index is 25.79 kg/m.  General: She is overweight, cooperative, alert, well developed, and in no acute distress. PSYCH: Has normal mood, affect and thought process.   HEENT: EOMI, sclerae are anicteric. Lungs: Normal breathing effort, no conversational dyspnea. Extremities: No edema.  Neurologic: No gross sensory or motor deficits. No tremors or fasciculations noted.    DIAGNOSTIC DATA REVIEWED:  BMET    Component Value Date/Time   NA 143 03/20/2022 0756   K 4.2 03/20/2022 0756   CL 101 03/20/2022 0756   CO2 25 03/20/2022 0756   GLUCOSE 87 03/20/2022 0756   GLUCOSE 105 (H) 10/30/2017 2320   BUN 14 03/20/2022 0756   CREATININE 1.03 (H) 03/20/2022 0756   CREATININE 1.01 01/22/2015 1033   CALCIUM 9.8 03/20/2022 0756   GFRNONAA 79 05/13/2019 1340   GFRAA 91 05/13/2019 1340   Lab Results  Component Value Date   HGBA1C 5.9 (H) 03/20/2022   HGBA1C 5.9 (H) 04/19/2014   Lab Results  Component Value Date   INSULIN 19.8 03/20/2022   INSULIN 22.8 08/03/2021   Lab Results  Component Value Date   TSH 0.878 08/03/2021   CBC    Component Value Date/Time   WBC 9.8 03/20/2022 0756   WBC 7.2 10/30/2017 2320   RBC 5.57 (H) 03/20/2022 0756   RBC 5.31 (H) 10/30/2017 2320   HGB 14.2 03/20/2022 0756   HCT 45.4 03/20/2022 0756   PLT  265 03/20/2022 0756   MCV 82 03/20/2022 0756   MCH 25.5 (L) 03/20/2022 0756   MCH 26.0 10/30/2017 2320   MCHC 31.3 (L) 03/20/2022 0756   MCHC 31.4 10/30/2017 2320   RDW 14.5 03/20/2022 0756   Iron Studies No results found for: "IRON", "TIBC", "FERRITIN", "IRONPCTSAT" Lipid Panel     Component Value Date/Time   CHOL 200 (H) 03/20/2022 0756   TRIG 77 03/20/2022 0756   HDL 46 03/20/2022 0756   CHOLHDL 6.9 (H) 11/02/2020 1552   CHOLHDL 4.8 04/19/2014 0725   VLDL 19 04/19/2014 0725   LDLCALC 140 (H) 03/20/2022 0756   Hepatic Function Panel     Component Value Date/Time   PROT 7.8 03/20/2022 0756   ALBUMIN 4.6 03/20/2022 0756   AST 21 03/20/2022 0756   ALT 13 03/20/2022 0756   ALKPHOS 91 03/20/2022 0756   BILITOT 0.5 03/20/2022 0756   BILIDIR 0.0 10/04/2012 0918      Component Value Date/Time   TSH 0.878 08/03/2021 0847   Nutritional Lab Results  Component Value Date   VD25OH 74.7 03/20/2022  VD25OH 57.4 08/03/2021   VD25OH 30.7 05/13/2019     Return in about 4 weeks (around 11/30/2022) for For Weight Mangement with Dr. Rikki Spearing.Marland Kitchen She was informed of the importance of frequent follow up visits to maximize her success with intensive lifestyle modifications for her multiple health conditions.   ATTESTASTION STATEMENTS:  Reviewed by clinician on day of visit: allergies, medications, problem list, medical history, surgical history, family history, social history, and previous encounter notes.     Worthy Rancher, MD

## 2022-11-02 NOTE — Assessment & Plan Note (Signed)
Blood pressure at goal for age and risk category.  On lisinopril 10 mg once a day and furosemide 40 mg without adverse effects.  Most recent renal parameters reviewed which showed normal electrolytes and kidney function.  Continue with weight loss therapy.  Monitor for symptoms of orthostasis while losing weight. Continue current regimen and home monitoring for a goal blood pressure of 120/80.  

## 2022-11-02 NOTE — Assessment & Plan Note (Signed)
Most recent A1c is  Lab Results  Component Value Date   HGBA1C 5.9 (H) 03/20/2022  Which has improved from 6.4.  Patient informed of disease state and risk of progression. This may contribute to abnormal cravings, fatigue and diabetes complications without having diabetes.   She will continue with weight loss therapy.  She will continue on incretin therapy.  We will check hemoglobin A1c and rest of her labs in August

## 2022-11-06 NOTE — Progress Notes (Deleted)
  TeleHealth Visit:  This visit was completed with telemedicine (audio/video) technology. Pola has verbally consented to this TeleHealth visit. The patient is located at home, the provider is located at home. The participants in this visit include the listed provider and patient. The visit was conducted today via MyChart video.  OBESITY Nattie is here to discuss her progress with her obesity treatment plan along with follow-up of her obesity related diagnoses.   Today's visit was # 19 Starting weight: 202 lbs Starting date: 08/03/21 Weight at last in office visit: 155 lbs on 11/02/22 Total weight loss: 47 lbs at last in office visit on 11/02/22. Today's reported weight (***): {dwwweightreported:29243}  Nutrition Plan: the Category 2 plan - ***% adherence.  Current exercise: {exercise types:16438} walking, pilates, 30 minutes 6 times per week.   Interim History:  ***  Eating all of the prescribed protein: {yes***/no:17258} Skipping meals: {dwwyes:29172} Drinking adequate water: {dwwyes:29172} Drinking sugar sweetened beverages: {dwwyes:29172} Hunger controlled: {EWCONTROLASSESSMENT:24261}. Cravings controlled:  {EWCONTROLASSESSMENT:24261}.  Journaling Consistently:  {dwwyes:29172} Meeting protein goals:  {dwwyes:29172} Meeting calorie goals:  {dwwyes:29172}   Pharmacotherapy: Enis is on {dwwpharmacotherapy:29109} Adverse side effects: {dwwse:29122} Hunger is {EWCONTROLASSESSMENT:24261}.  Cravings are {EWCONTROLASSESSMENT:24261}.  Assessment/Plan:  1. ***  2. ***  3. ***  {dwwmorbid:29108::"Morbid Obesity"}: Current BMI ***  Pharmacotherapy Plan {dwwmed:29123}  {dwwpharmacotherapy:29109}  Marline {CHL AMB IS/IS NOT:210130109} currently in the action stage of change. As such, her goal is to {MWMwtloss#1:210800005}.  She has agreed to {dwwsldiets:29085}.  Exercise goals: {MWM EXERCISE RECS:23473}  Behavioral modification strategies:  {dwwslwtlossstrategies:29088}.  Kalandra has agreed to follow-up with our clinic in {NUMBER 1-10:22536} {dwwfutime:29619}  No orders of the defined types were placed in this encounter.   There are no discontinued medications.   No orders of the defined types were placed in this encounter.     Objective:   VITALS: Per patient if applicable, see vitals. GENERAL: Alert and in no acute distress. CARDIOPULMONARY: No increased WOB. Speaking in clear sentences.  PSYCH: Pleasant and cooperative. Speech normal rate and rhythm. Affect is appropriate. Insight and judgement are appropriate. Attention is focused, linear, and appropriate.  NEURO: Oriented as arrived to appointment on time with no prompting.   Attestation Statements:   Reviewed by clinician on day of visit: allergies, medications, problem list, medical history, surgical history, family history, social history, and previous encounter notes.  ***(delete if time-based billing not used) Time spent on visit including the items listed below was *** minutes.  -preparing to see the patient (e.g., review of tests, history, previous notes) -obtaining and/or reviewing separately obtained history -counseling and educating the patient/family/caregiver -documenting clinical information in the electronic or other health record -ordering medications, tests, or procedures -independently interpreting results and communicating results to the patient/ family/caregiver -referring and communicating with other health care professionals  -care coordination   This was prepared with the assistance of Engineer, civil (consulting).  Occasional wrong-word or sound-a-like substitutions may have occurred due to the inherent limitations of voice recognition software.

## 2022-11-07 ENCOUNTER — Telehealth (INDEPENDENT_AMBULATORY_CARE_PROVIDER_SITE_OTHER): Payer: 59 | Admitting: Family Medicine

## 2022-11-09 ENCOUNTER — Other Ambulatory Visit: Payer: Self-pay | Admitting: Cardiology

## 2022-11-09 NOTE — Telephone Encounter (Signed)
*  STAT* If patient is at the pharmacy, call can be transferred to refill team.   1. Which medications need to be refilled? (please list name of each medication and dose if known) furosemide (LASIX) 40 MG tablet    2. Would you like to learn more about the convenience, safety, & potential cost savings by using the [facility]? No   3. Are you open to using the [facility] (Type [facility]. ) No  4. Which pharmacy/location (including street and city if local pharmacy) is medication to be sent to?[id number] - [place], [place] - [address]    5. Do they need a 30 day or 90 day supply? 90 day   Pt has scheduled appt for 10/1.

## 2022-11-09 NOTE — Telephone Encounter (Signed)
Pt has not been seen since 2022, but had an appt and cancelled it. Pt now has another appt for October 2024 and requesting refills for medication furosemide 40 mg tablets. Does pt need to come in sooner to get refill or can pt get refills until appt in October 2024? Please advise

## 2022-11-10 MED ORDER — FUROSEMIDE 40 MG PO TABS: 40.0000 mg | ORAL_TABLET | Freq: Every day | ORAL | 3 refills | Status: DC

## 2022-11-12 ENCOUNTER — Other Ambulatory Visit (INDEPENDENT_AMBULATORY_CARE_PROVIDER_SITE_OTHER): Payer: Self-pay | Admitting: Internal Medicine

## 2022-11-12 DIAGNOSIS — E669 Obesity, unspecified: Secondary | ICD-10-CM

## 2022-11-12 DIAGNOSIS — E66811 Obesity, class 1: Secondary | ICD-10-CM

## 2022-12-07 ENCOUNTER — Ambulatory Visit (INDEPENDENT_AMBULATORY_CARE_PROVIDER_SITE_OTHER): Payer: 59 | Admitting: Internal Medicine

## 2022-12-07 ENCOUNTER — Encounter (INDEPENDENT_AMBULATORY_CARE_PROVIDER_SITE_OTHER): Payer: Self-pay | Admitting: Internal Medicine

## 2022-12-07 VITALS — BP 130/79 | HR 68 | Temp 98.1°F | Ht 65.0 in | Wt 152.0 lb

## 2022-12-07 DIAGNOSIS — E668 Other obesity: Secondary | ICD-10-CM

## 2022-12-07 DIAGNOSIS — Z6825 Body mass index (BMI) 25.0-25.9, adult: Secondary | ICD-10-CM | POA: Diagnosis not present

## 2022-12-07 DIAGNOSIS — E669 Obesity, unspecified: Secondary | ICD-10-CM

## 2022-12-07 DIAGNOSIS — F439 Reaction to severe stress, unspecified: Secondary | ICD-10-CM

## 2022-12-07 MED ORDER — WEGOVY 1.7 MG/0.75ML ~~LOC~~ SOAJ
1.7000 mg | SUBCUTANEOUS | 0 refills | Status: DC
Start: 2022-12-07 — End: 2022-12-28

## 2022-12-07 NOTE — Progress Notes (Signed)
Office: [phone]  /  Fax: [phone]  WEIGHT SUMMARY AND BIOMETRICS  Vitals Temp: 98.1 F (36.7 C) BP: 130/79 Pulse Rate: 68 SpO2: 100 %   Anthropometric Measurements Height: 5\' 5"  (1.651 m) Weight: 152 lb (68.9 kg) BMI (Calculated): 25.29 Weight at Last Visit: 155 lb Weight Lost Since Last Visit: 3 lb Weight Gained Since Last Visit: 0 lb Starting Weight: 202 lb Total Weight Loss (lbs): 50 lb (22.7 kg) Peak Weight: 220 lb   Body Composition  Body Fat %: 29.2 % Fat Mass (lbs): 44.6 lbs Muscle Mass (lbs): 102.6 lbs Total Body Water (lbs): 63.2 lbs Visceral Fat Rating : 7    No data recorded Today's Visit #: 19  Starting Date: 08/03/21   HPI  Chief Complaint: OBESITY  [name] is here to discuss her progress with her obesity treatment plan. She is on the the Category 2 Plan and states she is following her eating plan approximately 80 % of the time. She states she is exercising 30 minutes 4 times per week.  Interval History:  Since last office visit she has lost 3 lbs.  She reports good adherence to reduced calorie nutritional plan. She has been working on not skipping meals, increasing protein intake at every meal, eating more fruits, eating more vegetables, making healthier choices, continues to exercise, and mindfulness around eating  Orixegenic Control: Denies problems with appetite and hunger signals.  Denies problems with satiety and satiation.  Denies problems with eating patterns and portion control.  Denies abnormal cravings. Denies feeling deprived or restricted.   Barriers identified: moderate to high levels of stress.   Pharmacotherapy for weight loss: She is currently taking Wegovy with adequate clinical response  and without side effects..    ASSESSMENT AND PLAN  TREATMENT PLAN FOR OBESITY:  Recommended Dietary Goals  [name] is currently in the action stage of change. As such, her goal is to continue weight management plan. She has  agreed to: continue current plan  Behavioral Intervention  We discussed the following Behavioral Modification Strategies today: increasing lean protein intake, decreasing simple carbohydrates , increasing vegetables, increasing lower glycemic fruits, increasing water intake, continue to practice mindfulness when eating, and planning for success.  Additional resources provided today:  She was given a handout on plateauing and also tips for weight loss.  Recommended Physical Activity Goals  [name] has been advised to work up to 150 minutes of moderate intensity aerobic activity a week and strengthening exercises 2-3 times per week for cardiovascular health, weight loss maintenance and preservation of muscle mass.   She has agreed to :  Continue current level of physical activity   Pharmacotherapy We discussed various medication options to help Cartney with her weight loss efforts and we both agreed to : continue current anti-obesity medication regimen  ASSOCIATED CONDITIONS ADDRESSED TODAY  Stress Assessment & Plan: Patient is under high levels of stress due to problems at home and also demands at work.  She has been exercising.  We discussed reaching out through her insurance to see if she has coverage for counseling and therapy.  We also discussed engaging in relaxation methods.   Class 1 obesity with serious comorbidity and body mass index (BMI) of 33.0 to 33.9 in adult, unspecified obesity type Assessment & Plan: Considering her peak weight she has lost 68 pounds or 31% of total body weight.  Her BMI is now at 25, with a body fat percentage of 29% and visceral fat rating of 7% which suggest  significant improvements in body composition beyond weight loss.  Her muscle loss rate is less than 20%.  She has good adherence to reduced calorie nutrition plan, has increase her protein and is also physically active.  She would like to reach 150 pounds before working on a maintenance strategy.  She  will continue on incretin therapy.  We again emphasized the importance of strengthening exercises.  Orders: -     Wegovy; Inject 1.7 mg into the skin once a week.  Dispense: 3 mL; Refill: 0    PHYSICAL EXAM:  Blood pressure 130/79, pulse 68, temperature 98.1 F (36.7 C), height 5\' 5"  (1.651 m), weight 152 lb (68.9 kg), SpO2 100%. Body mass index is 25.29 kg/m.  General: She is overweight, cooperative, alert, well developed, and in no acute distress. PSYCH: Has normal mood, affect and thought process.   HEENT: EOMI, sclerae are anicteric. Lungs: Normal breathing effort, no conversational dyspnea. Extremities: No edema.  Neurologic: No gross sensory or motor deficits. No tremors or fasciculations noted.    DIAGNOSTIC DATA REVIEWED:  BMET    Component Value Date/Time   NA 143 03/20/2022 0756   K 4.2 03/20/2022 0756   CL 101 03/20/2022 0756   CO2 25 03/20/2022 0756   GLUCOSE 87 03/20/2022 0756   GLUCOSE 105 (H) 10/30/2017 2320   BUN 14 03/20/2022 0756   CREATININE 1.03 (H) 03/20/2022 0756   CREATININE 1.01 01/22/2015 1033   CALCIUM 9.8 03/20/2022 0756   GFRNONAA 79 05/13/2019 1340   GFRAA 91 05/13/2019 1340   Lab Results  Component Value Date   HGBA1C 5.9 (H) 03/20/2022   HGBA1C 5.9 (H) 04/19/2014   Lab Results  Component Value Date   INSULIN 19.8 03/20/2022   INSULIN 22.8 08/03/2021   Lab Results  Component Value Date   TSH 0.878 08/03/2021   CBC    Component Value Date/Time   WBC 9.8 03/20/2022 0756   WBC 7.2 10/30/2017 2320   RBC 5.57 (H) 03/20/2022 0756   RBC 5.31 (H) 10/30/2017 2320   HGB 14.2 03/20/2022 0756   HCT 45.4 03/20/2022 0756   PLT 265 03/20/2022 0756   MCV 82 03/20/2022 0756   MCH 25.5 (L) 03/20/2022 0756   MCH 26.0 10/30/2017 2320   MCHC 31.3 (L) 03/20/2022 0756   MCHC 31.4 10/30/2017 2320   RDW 14.5 03/20/2022 0756   Iron Studies No results found for: "IRON", "TIBC", "FERRITIN", "IRONPCTSAT" Lipid Panel     Component Value  Date/Time   CHOL 200 (H) 03/20/2022 0756   TRIG 77 03/20/2022 0756   HDL 46 03/20/2022 0756   CHOLHDL 6.9 (H) 11/02/2020 1552   CHOLHDL 4.8 04/19/2014 0725   VLDL 19 04/19/2014 0725   LDLCALC 140 (H) 03/20/2022 0756   Hepatic Function Panel     Component Value Date/Time   PROT 7.8 03/20/2022 0756   ALBUMIN 4.6 03/20/2022 0756   AST 21 03/20/2022 0756   ALT 13 03/20/2022 0756   ALKPHOS 91 03/20/2022 0756   BILITOT 0.5 03/20/2022 0756   BILIDIR 0.0 10/04/2012 0918      Component Value Date/Time   TSH 0.878 08/03/2021 0847   Nutritional Lab Results  Component Value Date   VD25OH 74.7 03/20/2022   VD25OH 57.4 08/03/2021   VD25OH 30.7 05/13/2019     Return in about 4 weeks (around 01/04/2023) for For Weight Mangement with Dr. Rikki Spearing.Marland Kitchen She was informed of the importance of frequent follow up visits to maximize her success with intensive  lifestyle modifications for her multiple health conditions.   ATTESTASTION STATEMENTS:  Reviewed by clinician on day of visit: allergies, medications, problem list, medical history, surgical history, family history, social history, and previous encounter notes.     Worthy Rancher, MD

## 2022-12-07 NOTE — Assessment & Plan Note (Signed)
Patient is under high levels of stress due to problems at home and also demands at work.  She has been exercising.  We discussed reaching out through her insurance to see if she has coverage for counseling and therapy.  We also discussed engaging in relaxation methods.

## 2022-12-07 NOTE — Assessment & Plan Note (Signed)
Considering her peak weight she has lost 68 pounds or 31% of total body weight.  Her BMI is now at 25, with a body fat percentage of 29% and visceral fat rating of 7% which suggest significant improvements in body composition beyond weight loss.  Her muscle loss rate is less than 20%.  She has good adherence to reduced calorie nutrition plan, has increase her protein and is also physically active.  She would like to reach 150 pounds before working on a maintenance strategy.  She will continue on incretin therapy.  We again emphasized the importance of strengthening exercises.

## 2022-12-08 ENCOUNTER — Encounter: Payer: Self-pay | Admitting: Family Medicine

## 2022-12-28 ENCOUNTER — Encounter (INDEPENDENT_AMBULATORY_CARE_PROVIDER_SITE_OTHER): Payer: Self-pay | Admitting: Family Medicine

## 2022-12-28 ENCOUNTER — Telehealth (INDEPENDENT_AMBULATORY_CARE_PROVIDER_SITE_OTHER): Payer: 59 | Admitting: Family Medicine

## 2022-12-28 DIAGNOSIS — Z6825 Body mass index (BMI) 25.0-25.9, adult: Secondary | ICD-10-CM | POA: Diagnosis not present

## 2022-12-28 DIAGNOSIS — R632 Polyphagia: Secondary | ICD-10-CM

## 2022-12-28 DIAGNOSIS — F439 Reaction to severe stress, unspecified: Secondary | ICD-10-CM

## 2022-12-28 DIAGNOSIS — E669 Obesity, unspecified: Secondary | ICD-10-CM

## 2022-12-28 MED ORDER — WEGOVY 1.7 MG/0.75ML ~~LOC~~ SOAJ
1.7000 mg | SUBCUTANEOUS | 0 refills | Status: DC
Start: 2022-12-28 — End: 2022-12-28

## 2022-12-28 MED ORDER — WEGOVY 1.7 MG/0.75ML ~~LOC~~ SOAJ
1.7000 mg | SUBCUTANEOUS | 1 refills | Status: DC
Start: 2022-12-28 — End: 2023-02-05

## 2022-12-28 NOTE — Progress Notes (Signed)
TeleHealth Visit:  This visit was completed with telemedicine (audio/video) technology. [name] has verbally consented to this TeleHealth visit. The patient is located at home, the provider is located at home. The participants in this visit include the listed provider and patient. The visit was conducted today via MyChart video.  OBESITY [name] is here to discuss her progress with her obesity treatment plan along with follow-up of her obesity related diagnoses.   Today's visit was # 20 Starting weight: 202 lbs Starting date: 08/03/21 Weight at last in office visit: 152 lbs on 12/07/22 Total weight loss: 50 lbs at last in office visit on 12/07/22. Today's reported weight (12/28/22):  151-152 lbs  Nutrition Plan: the Category 2 plan   Current exercise:  walking 30 minutes 4 times per week.   Interim History:  She feels that eating healthy is a way of life. She says it isn't a struggle at all to continue to eat healthy foods. She is making healthy choices rather than following the category 2 plan exactly. She is maintaining her weight very well. Protein intake is very good. She is walking but not currently doing any resistance training.  Pharmacotherapy: [name] is on Wegovy 1.7 SQ weekly Adverse side effects: None Hunger is well controlled.  Cravings are well controlled.  Assessment/Plan:  1. Polyphagia Currently this is well controlled. Medication(s): Wegovy 1.7 SQ weekly.  Reported side effects: None  Plan: Continue and refill Wegovy 1.7 SQ weekly  2. Stress She is undergoing a lot of personal stress. She is planning on seeing a counselor. She feels that walking helps relieve her stress.  Plan: She is planning on calling the counselor's office today. I suggested that she try out the "Calm" app.  3. Generalized Obesity: Current BMI 25  Pharmacotherapy Plan Continue and refill  Wegovy 1.7 SQ weekly  [name] is currently in the action stage of change. As such, her  goal is to maintain weight for now.  She has agreed to the Category 2 plan and practicing portion control and making smarter food choices, such as increasing vegetables and decreasing simple carbohydrates.  Exercise goals: Discussed continuing walking but adding hand weights 2 to 3 days/week. Encouraged her to begin more intensive strength training using home videos.  Behavioral modification strategies: increasing lean protein intake and decreasing simple carbohydrates .  [name] has agreed to follow-up with our clinic in 6 weeks.  No orders of the defined types were placed in this encounter.   Medications Discontinued During This Encounter  Medication Reason   Semaglutide-Weight Management (WEGOVY) 1.7 MG/0.75ML SOAJ Reorder   Semaglutide-Weight Management (WEGOVY) 1.7 MG/0.75ML SOAJ Reorder     Meds ordered this encounter  Medications   DISCONTD: Semaglutide-Weight Management (WEGOVY) 1.7 MG/0.75ML SOAJ    Sig: Inject 1.7 mg into the skin once a week.    Dispense:  3 mL    Refill:  0    Order Specific Question:   Supervising Provider    Answer:   [name] [[id number]]   Semaglutide-Weight Management (WEGOVY) 1.7 MG/0.75ML SOAJ    Sig: Inject 1.7 mg into the skin once a week.    Dispense:  3 mL    Refill:  1    Order Specific Question:   Supervising Provider    Answer:   [name] [[id number]]      Objective:   VITALS: Per patient if applicable, see vitals. GENERAL: Alert and in no acute distress. CARDIOPULMONARY: No increased WOB. Speaking in clear sentences.  PSYCH: Pleasant and cooperative. Speech normal rate and rhythm. Affect is appropriate. Insight and judgement are appropriate. Attention is focused, linear, and appropriate.  NEURO: Oriented as arrived to appointment on time with no prompting.   Attestation Statements:   Reviewed by clinician on day of visit: allergies, medications, problem list, medical history, surgical history, family history, social history, and  previous encounter notes.  This was prepared with the assistance of Engineer, civil (consulting).  Occasional wrong-word or sound-a-like substitutions may have occurred due to the inherent limitations of voice recognition software.

## 2023-01-09 NOTE — Progress Notes (Deleted)
Cardiology Office Note:    Date:  01/09/2023   ID:  [name], DOB 23-Mar-1961, MRN [id number]  PCP:  Doreene Eland, MD  Cardiologist:  Olga Millers, MD  Electrophysiologist:  None   Referring MD: Doreene Eland, MD   Chief Complaint: follow-up of hypertrophic cardiomyopathy  History of Present Illness:    [name] is a 62 y.o. female with a history of normal coronary arteries on cardiac catheterization in 10/2007 and in 03/2014,  apical hypertrophic cardiomyopathy, chronic diastolic CHF, hypertension, hyperlipidemia, GERD, and anxiety who is followed by Dr. Jens Som and presents today for routine follow-up.   Patient is primarily followed by Cardiology for history of hypertrophic cardiomyopathy. Cardiac catheterization in 10/2007 showed normal coronaries and apical hypertrophy. Myoview in 03/2014 was high risk with reversible defects in the mid and distal anterior and anterolateral walls concerning for ischemia and EF of 37%. This led to a repeat cardiac catheterization which again showed normal coronaries. Prior cardiac monitors in 01/2015 and 07/2019 showed PACs/ PVCs and brief PAT but no significant arrhythmias. Cardiac MRI in 12/2019 showed normal LV and RV size and function with asymmetric LV hypertrophy measuring up to 25mm in mid inferoseptum with patchy late gadolinium enhancement at the RV insertion site (LGE was 13% of total myocardial mass). Findings were consistent with hypertrophic cardiomyopathy.   Patient was last seen by Dr. Jens Som in 11/2020 at which time she was doing well from a cardiac standpoint. Repeat Echo was ordered and showed LVEF of 70-75% with severe LVH most prominent at the apex consistent with apical hypertrophic cardiomyopathy and grade 2 diastolic dysfunction as well as mild mitral regurgitation.   ***  Apical Hypertrophic Cardiomyopathy Chronic Diastolic CHF Patient has a history of apical hypertrophic cardiomyopathy. Cardiac MRI in  12/2019 showed  normal LV and RV size and function with asymmetric LV hypertrophy measuring up to 25mm in mid inferoseptum with patchy late gadolinium enhancement at the RV insertion site (LGE was 13% of total myocardial mass). Findings were consistent with hypertrophic cardiomyopathy. Last Echo in 12/2020 showed LVEF of 70-75% with severe LVH most prominent at the apex consistent with apical hypertrophic cardiomyopathy and grade 2 diastolic dysfunction as well as mild mitral regurgitation.  - Doing well. Asymptomatic. Euvolemic on exam. *** - Continue Lasix 40mg  daily. *** - Previously on Toprol-XL 12.5mg  daily. *** - Will repeat Echo. *** - Again discussed the importance of having her children screened. ***  Hypertension BP well controlled. *** - Continue Lisinopril 10mg  daily. - Toprol-XL ***  Hyperlipidemia Lipid panel in 02/2022: Total Cholesterol 200, Triglycerides 77, HDL 46, LDL 140.  - ASCVD 10 year risk = 4.6%. Considered low risk; therefore, okay to continue with lifestyle modification fo rnow. - Followed by PCP. ***  EKGs/Labs/Other Studies Reviewed:    The following studies were reviewed:  Myoview 04/20/2014: Impressions: 1. Small in size, mild in intensity reversible defects in the mid  and distal anterior and anterolateral walls concerning for ischemia.  There is also a small sized, moderate partially fixed defect in the  inferior wall with a small area of ischemia vs. Variations in  diaphragmatic attenuation.  2.  Moderate LV dysfunction with mid and distal inferior dyskinesis.  3. Left ventricular ejection fraction 37%  4. High-risk stress test findings*.  _______________  Cardiac Catheterization 04/21/2014: Impressions: Apical variant hypertrophic cardiomyopathy with marked apical hypertrophy and hyperdynamic LV function.   Normal coronary arteries. _______________  Monitor 07/2019: Sinus bradycardia, NSR, PACs,  brief PAT, rare PVC and couplet   _______________  Cardiac MRI 12/24/2019: Impressions: 1. Asymmetric LV hypertrophy measuring up to 25mm in mid inferoseptum, consistent with hypertrophic cardiomyopathy, reverse septal curvature subtype. 2. Patchy late gadolinium enhancement at RV insertion site, mid to apical lateral wall, and apex. This is consistent with HCM. LGE accounts for 13% of total myocardial mass 3.  Normal LV size and systolic function (EF 64%) 4.  Normal RV size and systolic function (EF 73%) _______________  Echocardiogram 01/19/2021: Impressions: 1. Normal LV function; severe LVH most prominent at apex; intracavitary  gradient of 3 m/s; findings consistent with apical HCM.   2. Left ventricular ejection fraction, by estimation, is 70 to 75%. The  left ventricle has hyperdynamic function. The left ventricle has no  regional wall motion abnormalities. There is severe left ventricular  hypertrophy of the apical segment. Left  ventricular diastolic parameters are consistent with Grade II diastolic  dysfunction (pseudonormalization). Elevated left atrial pressure.   3. Right ventricular systolic function is normal. The right ventricular  size is normal.   4. Left atrial size was mildly dilated.   5. The mitral valve is normal in structure. Mild mitral valve  regurgitation. No evidence of mitral stenosis.   6. The aortic valve is tricuspid. Aortic valve regurgitation is not  visualized. No aortic stenosis is present.   7. The inferior vena cava is normal in size with greater than 50%  respiratory variability, suggesting right atrial pressure of 3 mmHg.   EKG:  EKG  ordered today. EKG personally reviewed and demonstrates ***.  Recent Labs: 03/20/2022: ALT 13; BUN 14; Creatinine, Ser 1.03; Hemoglobin 14.2; Platelets 265; Potassium 4.2; Sodium 143  Recent Lipid Panel    Component Value Date/Time   CHOL 200 (H) 03/20/2022 0756   TRIG 77 03/20/2022 0756   HDL 46 03/20/2022 0756   CHOLHDL 6.9 (H)  11/02/2020 1552   CHOLHDL 4.8 04/19/2014 0725   VLDL 19 04/19/2014 0725   LDLCALC 140 (H) 03/20/2022 0756    Physical Exam:    Vital Signs: There were no vitals taken for this visit.    Wt Readings from Last 3 Encounters:  12/07/22 152 lb (68.9 kg)  11/02/22 155 lb (70.3 kg)  10/05/22 155 lb (70.3 kg)     General: 62 y.o. female in no acute distress. HEENT: Normocephalic and atraumatic. Sclera clear. EOMs intact. Neck: Supple. No carotid bruits. No JVD. Heart: *** RRR. Distinct S1 and S2. No murmurs, gallops, or rubs. Radial and distal pedal pulses 2+ and equal bilaterally. Lungs: No increased work of breathing. Clear to ausculation bilaterally. No wheezes, rhonchi, or rales.  Abdomen: Soft, non-distended, and non-tender to palpation. Bowel sounds present in all 4 quadrants.  MSK: Normal strength and tone for age. *** Extremities: No lower extremity edema.    Skin: Warm and dry. Neuro: Alert and oriented x3. No focal deficits. Psych: Normal affect. Responds appropriately.   Assessment:    No diagnosis found.  Plan:     Disposition: Follow up in ***   Medication Adjustments/Labs and Tests Ordered: Current medicines are reviewed at length with the patient today.  Concerns regarding medicines are outlined above.  No orders of the defined types were placed in this encounter.  No orders of the defined types were placed in this encounter.   There are no Patient Instructions on file for this visit.   Signed, Corrin Parker, PA-C  01/09/2023 9:33 PM    [facility]

## 2023-01-23 ENCOUNTER — Ambulatory Visit: Payer: 59 | Admitting: Student

## 2023-02-05 ENCOUNTER — Ambulatory Visit (INDEPENDENT_AMBULATORY_CARE_PROVIDER_SITE_OTHER): Payer: 59 | Admitting: Internal Medicine

## 2023-02-05 ENCOUNTER — Encounter (INDEPENDENT_AMBULATORY_CARE_PROVIDER_SITE_OTHER): Payer: Self-pay | Admitting: Internal Medicine

## 2023-02-05 VITALS — BP 130/75 | HR 74 | Temp 97.8°F | Ht 65.0 in | Wt 155.0 lb

## 2023-02-05 DIAGNOSIS — F439 Reaction to severe stress, unspecified: Secondary | ICD-10-CM

## 2023-02-05 DIAGNOSIS — E559 Vitamin D deficiency, unspecified: Secondary | ICD-10-CM | POA: Diagnosis not present

## 2023-02-05 DIAGNOSIS — Z6825 Body mass index (BMI) 25.0-25.9, adult: Secondary | ICD-10-CM

## 2023-02-05 DIAGNOSIS — R7303 Prediabetes: Secondary | ICD-10-CM

## 2023-02-05 DIAGNOSIS — R638 Other symptoms and signs concerning food and fluid intake: Secondary | ICD-10-CM | POA: Insufficient documentation

## 2023-02-05 DIAGNOSIS — E669 Obesity, unspecified: Secondary | ICD-10-CM

## 2023-02-05 MED ORDER — WEGOVY 1.7 MG/0.75ML ~~LOC~~ SOAJ
1.7000 mg | SUBCUTANEOUS | 1 refills | Status: DC
Start: 2023-02-05 — End: 2023-03-06

## 2023-02-05 NOTE — Assessment & Plan Note (Signed)
Stable.  On Wegovy 1.7 mg once a week.  We will check fasting blood glucose, hemoglobin A1c and insulin levels at the next office visit.  We discussed the benefits of a low-carb meal plan.

## 2023-02-05 NOTE — Assessment & Plan Note (Signed)
Contributing to stress eating.  Has been receiving counseling.  Provided with strategies to destress, troubleshoot cravings and reduce treating stress with food.

## 2023-02-05 NOTE — Assessment & Plan Note (Signed)
She is taking vitamin D 2000 international units daily.  Her levels have not been checked since November of last year and they were in the 70s.  We will repeat vitamin D levels at the next office visit to make sure levels are not supratherapeutic

## 2023-02-05 NOTE — Assessment & Plan Note (Signed)
Improved on Wegovy 1.7 mg once a week.  She has noticed increased cravings due to stress.  We also discussed ways to reduce insulin levels by maintaining a low-carb diet.

## 2023-02-05 NOTE — Progress Notes (Signed)
Office: [phone]  /  Fax: [phone]  WEIGHT SUMMARY AND BIOMETRICS  Vitals Temp: 97.8 F (36.6 C) BP: 130/75 Pulse Rate: 74 SpO2: 98 %   Anthropometric Measurements Height: 5\' 5"  (1.651 m) Weight: 155 lb (70.3 kg) BMI (Calculated): 25.79 Weight at Last Visit: 152 lb Weight Lost Since Last Visit: 0 l Weight Gained Since Last Visit: 3 lb Starting Weight: 202 lb Total Weight Loss (lbs): 47 lb (21.3 kg) Peak Weight: 220 lb   Body Composition  Body Fat %: 30.5 % Fat Mass (lbs): 47.4 lbs Muscle Mass (lbs): 102.6 lbs Total Body Water (lbs): 65.2 lbs Visceral Fat Rating : 7    No data recorded Today's Visit #: 20  Starting Date: 08/03/21   HPI  Chief Complaint: OBESITY  [name] is here to discuss her progress with her obesity treatment plan. She is on the the Category 2 Plan and states she is following her eating plan approximately 80 % of the time. She states she is exercising 30 minutes 3 times per week.  Interval History:  Since last office visit she has gained 3 pounds.  She has noticed increased consumption of carbohydrates due to stress. She reports good adherence to reduced calorie nutritional plan. She has been working on reading food labels, not skipping meals, increasing protein intake at every meal, drinking more water, making healthier choices, reducing portion sizes, and incorporating more whole foods  Orexigenic Control: Denies problems with appetite and hunger signals.  Denies problems with satiety and satiation.  Denies problems with eating patterns and portion control.  Reports abnormal cravings. Denies feeling deprived or restricted.   Barriers identified: moderate to high levels of stress.   Pharmacotherapy for weight loss: She is currently taking Wegovy with adequate clinical response  and without side effects..    ASSESSMENT AND PLAN  TREATMENT PLAN FOR OBESITY:  Recommended Dietary Goals  [name] is currently in the action stage  of change. As such, her goal is to continue weight management plan. She has agreed to: continue current plan  Behavioral Intervention  We discussed the following Behavioral Modification Strategies today: continue to work on maintaining a reduced calorie state, getting the recommended amount of protein, incorporating whole foods, making healthy choices, staying well hydrated and practicing mindfulness when eating..  Additional resources provided today: None  Recommended Physical Activity Goals  [name] has been advised to work up to 150 minutes of moderate intensity aerobic activity a week and strengthening exercises 2-3 times per week for cardiovascular health, weight loss maintenance and preservation of muscle mass.   She has agreed to :  Think about enjoyable ways to increase daily physical activity and overcoming barriers to exercise and Increase physical activity in their day and reduce sedentary time (increase NEAT).  Pharmacotherapy We discussed various medication options to help [name] with her weight loss efforts and we both agreed to : continue with nutritional and behavioral strategies  ASSOCIATED CONDITIONS ADDRESSED TODAY  Stress Assessment & Plan: Contributing to stress eating.  Has been receiving counseling.  Provided with strategies to destress, troubleshoot cravings and reduce treating stress with food.   Abnormal food appetite Assessment & Plan: Improved on Wegovy 1.7 mg once a week.  She has noticed increased cravings due to stress.  We also discussed ways to reduce insulin levels by maintaining a low-carb diet.  Orders: -     Wegovy; Inject 1.7 mg into the skin once a week.  Dispense: 3 mL; Refill: 1  Generalized obesity with  starting BMI of 34 -     Wegovy; Inject 1.7 mg into the skin once a week.  Dispense: 3 mL; Refill: 1  BMI 25.0-25.9,adult  Vitamin D deficiency Assessment & Plan: She is taking vitamin D 2000 international units daily.  Her levels have not  been checked since November of last year and they were in the 70s.  We will repeat vitamin D levels at the next office visit to make sure levels are not supratherapeutic   Prediabetes Assessment & Plan: Stable.  On Wegovy 1.7 mg once a week.  We will check fasting blood glucose, hemoglobin A1c and insulin levels at the next office visit.  We discussed the benefits of a low-carb meal plan.     PHYSICAL EXAM:  Blood pressure 130/75, pulse 74, temperature 97.8 F (36.6 C), height 5\' 5"  (1.651 m), weight 155 lb (70.3 kg), SpO2 98%. Body mass index is 25.79 kg/m.  General: She is overweight, cooperative, alert, well developed, and in no acute distress. PSYCH: Has normal mood, affect and thought process.   HEENT: EOMI, sclerae are anicteric. Lungs: Normal breathing effort, no conversational dyspnea. Extremities: No edema.  Neurologic: No gross sensory or motor deficits. No tremors or fasciculations noted.    DIAGNOSTIC DATA REVIEWED:  BMET    Component Value Date/Time   NA 143 03/20/2022 0756   K 4.2 03/20/2022 0756   CL 101 03/20/2022 0756   CO2 25 03/20/2022 0756   GLUCOSE 87 03/20/2022 0756   GLUCOSE 105 (H) 10/30/2017 2320   BUN 14 03/20/2022 0756   CREATININE 1.03 (H) 03/20/2022 0756   CREATININE 1.01 01/22/2015 1033   CALCIUM 9.8 03/20/2022 0756   GFRNONAA 79 05/13/2019 1340   GFRAA 91 05/13/2019 1340   Lab Results  Component Value Date   HGBA1C 5.9 (H) 03/20/2022   HGBA1C 5.9 (H) 04/19/2014   Lab Results  Component Value Date   INSULIN 19.8 03/20/2022   INSULIN 22.8 08/03/2021   Lab Results  Component Value Date   TSH 0.878 08/03/2021   CBC    Component Value Date/Time   WBC 9.8 03/20/2022 0756   WBC 7.2 10/30/2017 2320   RBC 5.57 (H) 03/20/2022 0756   RBC 5.31 (H) 10/30/2017 2320   HGB 14.2 03/20/2022 0756   HCT 45.4 03/20/2022 0756   PLT 265 03/20/2022 0756   MCV 82 03/20/2022 0756   MCH 25.5 (L) 03/20/2022 0756   MCH 26.0 10/30/2017 2320   MCHC  31.3 (L) 03/20/2022 0756   MCHC 31.4 10/30/2017 2320   RDW 14.5 03/20/2022 0756   Iron Studies No results found for: "IRON", "TIBC", "FERRITIN", "IRONPCTSAT" Lipid Panel     Component Value Date/Time   CHOL 200 (H) 03/20/2022 0756   TRIG 77 03/20/2022 0756   HDL 46 03/20/2022 0756   CHOLHDL 6.9 (H) 11/02/2020 1552   CHOLHDL 4.8 04/19/2014 0725   VLDL 19 04/19/2014 0725   LDLCALC 140 (H) 03/20/2022 0756   Hepatic Function Panel     Component Value Date/Time   PROT 7.8 03/20/2022 0756   ALBUMIN 4.6 03/20/2022 0756   AST 21 03/20/2022 0756   ALT 13 03/20/2022 0756   ALKPHOS 91 03/20/2022 0756   BILITOT 0.5 03/20/2022 0756   BILIDIR 0.0 10/04/2012 0918      Component Value Date/Time   TSH 0.878 08/03/2021 0847   Nutritional Lab Results  Component Value Date   VD25OH 74.7 03/20/2022   VD25OH 57.4 08/03/2021   VD25OH 30.7 05/13/2019  Return in about 3 weeks (around 02/26/2023) for For Weight Mangement with Dr. Rikki Spearing - fasting for labs.. She was informed of the importance of frequent follow up visits to maximize her success with intensive lifestyle modifications for her multiple health conditions.   ATTESTASTION STATEMENTS:  Reviewed by clinician on day of visit: allergies, medications, problem list, medical history, surgical history, family history, social history, and previous encounter notes.     Worthy Rancher, MD

## 2023-02-23 NOTE — Progress Notes (Unsigned)
Cardiology Office Note:    Date:  03/08/2023   ID:  [name], DOB March 19, 1961, MRN [id number]  PCP:  Doreene Eland, MD  Cardiologist:  Olga Millers, MD     Referring MD: Doreene Eland, MD   Chief Complaint: follow-up of hypertrophic cardiomyopathy  History of Present Illness:    [name] is a 62 y.o. female with a history of  normal coronary arteries on cardiac catheterization in 10/2007 and in 03/2014,  apical hypertrophic cardiomyopathy, chronic diastolic CHF, hypertension, hyperlipidemia, GERD, and anxiety who is followed by Dr. Jens Som and presents today for routine follow-up.   Patient is primarily followed by Cardiology for history of hypertrophic cardiomyopathy. Cardiac catheterization in 10/2007 showed normal coronaries and apical hypertrophy. Myoview in 03/2014 was high risk with reversible defects in the mid and distal anterior and anterolateral walls concerning for ischemia and EF of 37%. This led to a repeat cardiac catheterization which again showed normal coronaries. Prior cardiac monitors in 01/2015 and 07/2019 showed PACs/ PVCs and brief PAT but no significant arrhythmias. Cardiac MRI in 12/2019 showed normal LV and RV size and function with asymmetric LV hypertrophy measuring up to 25mm in mid inferoseptum with patchy late gadolinium enhancement at the RV insertion site (LGE was 13% of total myocardial mass). Findings were consistent with hypertrophic cardiomyopathy.   She was last seen by Dr. Jens Som in 11/2020 at which time she was doing well from a cardiac standpoint. Repeat Echo was ordered and showed LVEF of 70-75% with severe LVH most prominent at the apex consistent with apical hypertrophic cardiomyopathy and grade 2 diastolic dysfunction as well as mild mitral regurgitation.   Patient presents today for follow-up. She report rare atypical chest discomfort during periods of high stress. She states she typically takes an Aspirin with this happens  and it goes away. This is not new for her and she states she has had this for years. Prior ischemic evaluations have been normal. She denies any exertional chest pain or chest pain outside of stressful situations. Does not sound like angina. No shortness of breath. She sleeps on about a 45 degree angle at night but has done this for years and this is stable. No PND. She denies any lower extremity edema but states sometimes she will notice her lips and hands are more swollen. She thinks this is related to when she eats foods higher in sodium. She takes an extra dose of Lasix when this happens and swelling resolves. She also reports some occasional heaviness her legs at night and some tingling. She states this happens about 3 times per week. She states her legs don't look swollen when this happens though. She is not sure whether this also correlates with when she eats foods higher in sodium. No claudication or lower extremity wounds to suggest PAD. No palpitations, lightheadedness, dizziness, or syncope.   She has been working with the [facility] over the last 2 year and is normally very careful with her sodium intake. Her weight is down about 40 lbs since her last visit 2 years ago.   EKGs/Labs/Other Studies Reviewed:    The following studies were reviewed:  Myoview 04/20/2014: Impressions: 1. Small in size, mild in intensity reversible defects in the mid  and distal anterior and anterolateral walls concerning for ischemia.  There is also a small sized, moderate partially fixed defect in the  inferior wall with a small area of ischemia vs. Variations in  diaphragmatic  attenuation.  2.  Moderate LV dysfunction with mid and distal inferior dyskinesis.  3. Left ventricular ejection fraction 37%  4. High-risk stress test findings*.  _______________   Cardiac Catheterization 04/21/2014: Impressions: Apical variant hypertrophic cardiomyopathy with marked apical hypertrophy and  hyperdynamic LV function.   Normal coronary arteries. _______________   Monitor 07/2019: Sinus bradycardia, NSR, PACs, brief PAT, rare PVC and couplet  _______________   Cardiac MRI 12/24/2019: Impressions: 1. Asymmetric LV hypertrophy measuring up to 25mm in mid inferoseptum, consistent with hypertrophic cardiomyopathy, reverse septal curvature subtype. 2. Patchy late gadolinium enhancement at RV insertion site, mid to apical lateral wall, and apex. This is consistent with HCM. LGE accounts for 13% of total myocardial mass 3.  Normal LV size and systolic function (EF 64%) 4.  Normal RV size and systolic function (EF 73%) _______________   Echocardiogram 01/19/2021: Impressions: 1. Normal LV function; severe LVH most prominent at apex; intracavitary  gradient of 3 m/s; findings consistent with apical HCM.   2. Left ventricular ejection fraction, by estimation, is 70 to 75%. The  left ventricle has hyperdynamic function. The left ventricle has no  regional wall motion abnormalities. There is severe left ventricular  hypertrophy of the apical segment. Left  ventricular diastolic parameters are consistent with Grade II diastolic  dysfunction (pseudonormalization). Elevated left atrial pressure.   3. Right ventricular systolic function is normal. The right ventricular  size is normal.   4. Left atrial size was mildly dilated.   5. The mitral valve is normal in structure. Mild mitral valve  regurgitation. No evidence of mitral stenosis.   6. The aortic valve is tricuspid. Aortic valve regurgitation is not  visualized. No aortic stenosis is present.   7. The inferior vena cava is normal in size with greater than 50%  respiratory variability, suggesting right atrial pressure of 3 mmHg.   EKG:  EKG ordered today  EKG Interpretation Date/Time:  Thursday March 08 2023 07:52:15 EST Ventricular Rate:  66 PR Interval:  138 QRS Duration:  94 QT Interval:  422 QTC Calculation: 442 R  Axis:   47  Text Interpretation: Normal sinus rhythm Left ventricular hypertrophy with repolarization abnormality ( Sokolow-Lyon , Romhilt-Estes ) When compared with ECG of 19-Apr-2014 05:56, ST less depressed in Anterolateral leads Otherwise no significant change Confirmed by Marjie Skiff [phone]) on 03/08/2023 8:01:06 AM    Recent Labs: 03/20/2022: Hemoglobin 14.2; Platelets 265 03/06/2023: ALT 21; BUN 20; Creatinine, Ser 1.08; Potassium 4.3; Sodium 141  Recent Lipid Panel    Component Value Date/Time   CHOL 203 (H) 03/06/2023 1127   TRIG 89 03/06/2023 1127   HDL 49 03/06/2023 1127   CHOLHDL 6.9 (H) 11/02/2020 1552   CHOLHDL 4.8 04/19/2014 0725   VLDL 19 04/19/2014 0725   LDLCALC 138 (H) 03/06/2023 1127    Physical Exam:    Vital Signs: BP 122/64 (BP Location: Left Arm, Patient Position: Sitting, Cuff Size: Normal)   Pulse 66   Ht 5\' 5"  (1.651 m)   Wt 161 lb (73 kg)   SpO2 97%   BMI 26.79 kg/m     Wt Readings from Last 3 Encounters:  03/08/23 161 lb (73 kg)  03/06/23 154 lb (69.9 kg)  02/05/23 155 lb (70.3 kg)     General: 62 y.o. African-American female in no acute distress. HEENT: Normocephalic and atraumatic. Sclera clear.  Neck: Supple. No carotid bruits. No JVD. Heart: RRR. II/VI systolic murmur. Lungs: No increased work of breathing. Clear to ausculation  bilaterally. No wheezes, rhonchi, or rales.  Abdomen: Soft, non-distended, and non-tender to palpation.  Extremities: No lower extremity edema.  Distal pedal pulses 2+ and equal bilaterally. Skin: Warm and dry. Neuro: No focal deficits. Psych: Normal affect. Responds appropriately.   Assessment:    1. Apical variant hypertrophic cardiomyopathy (HCC)   2. Chronic diastolic CHF (congestive heart failure) (HCC)   3. Atypical chest pain   4. Essential hypertension   5. Hyperlipidemia, unspecified hyperlipidemia type     Plan:    Apical Hypertrophic Cardiomyopathy Chronic Diastolic CHF Patient has a  history of apical hypertrophic cardiomyopathy. Cardiac MRI in 12/2019 showed  normal LV and RV size and function with asymmetric LV hypertrophy measuring up to 25mm in mid inferoseptum with patchy late gadolinium enhancement at the RV insertion site (LGE was 13% of total myocardial mass). Findings were consistent with hypertrophic cardiomyopathy. Last Echo in 12/2020 showed LVEF of 70-75% with severe LVH most prominent at the apex consistent with apical hypertrophic cardiomyopathy and grade 2 diastolic dysfunction as well as mild mitral regurgitation.  - Doing well. Asymptomatic. Euvolemic on exam.  - Continue Lasix 40mg  daily. OK to take an extra dose as needed for edema.  - Previously on Toprol-XL 12.5mg  daily but this was reportedly stopped due to low heart rates.  - Will repeat Echo.  - Discussed the importance of having her children screened. She states her children have already been checked.  Atypical Chest Pain Patient reports rare episodes atypical chest discomfort associated with periods of high stress.  She states this is now new and she has had this for years. Prior cardiac catheterizations in 2009 and 2015 showed normal coronaries. She denies any chest pain outside of periods of stress. No exertional symptoms.  - EKG shows no acute changes.  - Symptoms sounds atypical. No need for ischemic evaluation at this time. However, advised patient to let us know if symptoms worsen or if she starts having chest pain unrelated to stress or with exertion.  Hypertension BP well controlled.  - Continue Lisinopril 10mg  daily.  Hyperlipidemia Lipid panel on 03/06/2023: Total Cholesterol 203, Triglycerides 89, HDL 49, LDL 138.  - ASCVD 10 year risk = 6.7% which is considered borderline.  - Will start Crestor 10mg  daily.  - Will repeat lipid panel and LFTs in 2-3 months.  Disposition: Follow up in 1 year.    Signed, Corrin Parker, PA-C  03/08/2023 8:35 AM    [facility]

## 2023-03-06 ENCOUNTER — Ambulatory Visit (INDEPENDENT_AMBULATORY_CARE_PROVIDER_SITE_OTHER): Payer: 59 | Admitting: Internal Medicine

## 2023-03-06 ENCOUNTER — Encounter (INDEPENDENT_AMBULATORY_CARE_PROVIDER_SITE_OTHER): Payer: Self-pay | Admitting: Internal Medicine

## 2023-03-06 VITALS — BP 124/78 | HR 66 | Temp 97.8°F | Ht 65.0 in | Wt 154.0 lb

## 2023-03-06 DIAGNOSIS — I10 Essential (primary) hypertension: Secondary | ICD-10-CM

## 2023-03-06 DIAGNOSIS — E669 Obesity, unspecified: Secondary | ICD-10-CM

## 2023-03-06 DIAGNOSIS — E7800 Pure hypercholesterolemia, unspecified: Secondary | ICD-10-CM

## 2023-03-06 DIAGNOSIS — R638 Other symptoms and signs concerning food and fluid intake: Secondary | ICD-10-CM

## 2023-03-06 DIAGNOSIS — R7303 Prediabetes: Secondary | ICD-10-CM

## 2023-03-06 DIAGNOSIS — E785 Hyperlipidemia, unspecified: Secondary | ICD-10-CM

## 2023-03-06 DIAGNOSIS — Z6825 Body mass index (BMI) 25.0-25.9, adult: Secondary | ICD-10-CM

## 2023-03-06 DIAGNOSIS — E559 Vitamin D deficiency, unspecified: Secondary | ICD-10-CM

## 2023-03-06 DIAGNOSIS — E66811 Obesity, class 1: Secondary | ICD-10-CM

## 2023-03-06 MED ORDER — WEGOVY 1.7 MG/0.75ML ~~LOC~~ SOAJ
1.7000 mg | SUBCUTANEOUS | 1 refills | Status: DC
Start: 2023-03-06 — End: 2023-05-10

## 2023-03-06 NOTE — Progress Notes (Signed)
Office: [phone]  /  Fax: [phone]  Weight Summary And Biometrics  Vitals Temp: 97.8 F (36.6 C) BP: 124/78 Pulse Rate: 66 SpO2: 99 %   Anthropometric Measurements Height: 5\' 5"  (1.651 m) Weight: 154 lb (69.9 kg) BMI (Calculated): 25.63 Weight at Last Visit: 155 lb Weight Lost Since Last Visit: 1 lb Weight Gained Since Last Visit: 0 lb Starting Weight: 202 lb Total Weight Loss (lbs): 48 lb (21.8 kg) Peak Weight: 220 lb   Body Composition  Body Fat %: 29.8 % Fat Mass (lbs): 46 lbs Muscle Mass (lbs): 103 lbs Total Body Water (lbs): 63.2 lbs Visceral Fat Rating : 7    No data recorded Today's Visit #: No  Starting Date: 08/03/21   Subjective   Chief Complaint: Obesity  [name] is here to discuss her progress with her obesity treatment plan. She is on the the Category 2 Plan and states she is following her eating plan approximately 98 % of the time. She states she is exercising 15 minutes 5 times per week.  Interval History:   Discussed the use of AI scribe software for clinical note transcription with the patient, who gave verbal consent to proceed.  History of Present Illness   The patient, with a history of prediabetes and hypertension, presents for a follow-up weight management consultation. She is currently on Wegovy 1.7mg  once a week, following a reduced calorie nutrition plan, and exercising for 15 minutes five times a week. She reports adherence to her nutrition plan approximately 90% of the time.  The patient has been under significant stress, which has been improving with therapy. She reports a decrease in blood pressure since the last visit and a weight loss of one pound. She has not experienced any side effects from the [facility] medication.  The patient has been waking up early and working long hours, which has been a source of stress. Despite this, she makes an effort not to skip meals, often opting for scrambled eggs, 45-calorie bread, protein  drinks, or yogurt if not particularly hungry.  The patient's body fat percentage has decreased to 29%, down from 37% at the start of her weight loss journey. Her visceral fat rating has also improved, from 10 at the start to 7 currently.  The patient is considering increasing her exercise regimen, particularly strength training, to help maintain her weight loss. She is considering setting up a home gym in a spare room to facilitate this. She is also managing her stress and making decisions about her future.  She is currently taking Lisinopril for blood pressure and over-the-counter vitamin D. The patient's LDL cholesterol level was 010 in November of the previous year, which will be rechecked in the upcoming blood work.       Orexigenic Control:  Denies problems with appetite and hunger signals.  Denies problems with satiety and satiation.  Denies problems with eating patterns and portion control.  Denies abnormal cravings. Denies feeling deprived or restricted.   Barriers identified: none.   Pharmacotherapy for weight loss: She is currently taking Wegovy with adequate clinical response  and without side effects..   Assessment and Plan   Treatment Plan For Obesity:  Recommended Dietary Goals  [name] is currently in the action stage of change. As such, her goal is to continue weight management plan. She has agreed to: continue current plan  Behavioral Intervention  We discussed the following Behavioral Modification Strategies today: continue to work on maintaining a reduced calorie state, getting the recommended  amount of protein, incorporating whole foods, making healthy choices, staying well hydrated and practicing mindfulness when eating..  Additional resources provided today: None  Recommended Physical Activity Goals  [name] has been advised to work up to 150 minutes of moderate intensity aerobic activity a week and strengthening exercises 2-3 times per week for cardiovascular  health, weight loss maintenance and preservation of muscle mass.   She has agreed to :  Think about enjoyable ways to increase daily physical activity and overcoming barriers to exercise, Increase physical activity in their day and reduce sedentary time (increase NEAT)., and continue to gradually increase the amount and intensity of exercise   Pharmacotherapy  We discussed various medication options to help [name] with her weight loss efforts and we both agreed to : continue current anti-obesity medication regimen  Associated Conditions Addressed Today  Assessment and Plan    Weight Management   She is currently on Wegovy 1.7 mg weekly, adhering to a reduced calorie diet 90% of the time, and exercising for 15 minutes five times per week. Her stress levels are improving with therapy, and she has experienced no side effects from [facility]. Her body fat percentage has decreased from 37% to 29%, and her visceral fat rating improved from 10 to 7. The emphasis on maintaining weight loss includes focusing on protein intake and strengthening exercises. The importance of maintaining metabolism through increased physical activity and protein intake to prevent weight regain was discussed. We will continue Wegovy 1.7 mg weekly, encourage adherence to the reduced calorie diet, increase physical activity including strengthening exercises and high interval training, and consider setting up a home gym with a yoga mat and adjustable dumbbells.  Prediabetes   We plan to follow up on A1c and fasting insulin levels to monitor progress, anticipating improvements in blood work due to weight loss and improved body composition. We will order an A1c test and fasting insulin levels.  She will continue [facility] for pharmacoprophylaxis  Hypertension   Her hypertension is well-managed with lisinopril, with a blood pressure today of 124/78 mmHg. We are monitoring kidney function due to lisinopril use and will order a kidney  function test.  Hyperlipidemia   Her previous LDL was 140 mg/dL in November 6440. We plan to recheck cholesterol levels to determine if changes in diet and weight have impacted LDL levels, discussing that LDL levels can be influenced by genetics and may not always correlate with dietary changes. We will order a lipid panel and assess cardiovascular risk.  Vitamin D Monitoring   She was previously on high dose vitamin D and is now taking over-the-counter vitamin D. We plan to check vitamin D levels to ensure appropriate dosing and will order a vitamin D level test.  General Health Maintenance   She is making lifestyle modifications to manage stress and improve overall health. The importance of maintaining a balanced diet, regular exercise, and stress management was discussed. We encouraged setting up a home space for exercise and relaxation to facilitate adherence to physical activity goals, continuing therapy for stress management, maintaining a balanced diet, and regular exercise.  Follow-up   We will schedule a follow-up appointment in two months and send a refill for [facility] to [facility] on [address].             Objective   Physical Exam:  Blood pressure 124/78, pulse 66, temperature 97.8 F (36.6 C), height 5\' 5"  (1.651 m), weight 154 lb (69.9 kg), SpO2 99%. Body mass index is 25.63  kg/m.  General: She is overweight, cooperative, alert, well developed, and in no acute distress. PSYCH: Has normal mood, affect and thought process.   HEENT: EOMI, sclerae are anicteric. Lungs: Normal breathing effort, no conversational dyspnea. Extremities: No edema.  Neurologic: No gross sensory or motor deficits. No tremors or fasciculations noted.    Diagnostic Data Reviewed:  BMET    Component Value Date/Time   NA 143 03/20/2022 0756   K 4.2 03/20/2022 0756   CL 101 03/20/2022 0756   CO2 25 03/20/2022 0756   GLUCOSE 87 03/20/2022 0756   GLUCOSE 105 (H) 10/30/2017 2320   BUN 14  03/20/2022 0756   CREATININE 1.03 (H) 03/20/2022 0756   CREATININE 1.01 01/22/2015 1033   CALCIUM 9.8 03/20/2022 0756   GFRNONAA 79 05/13/2019 1340   GFRAA 91 05/13/2019 1340   Lab Results  Component Value Date   HGBA1C 5.9 (H) 03/20/2022   HGBA1C 5.9 (H) 04/19/2014   Lab Results  Component Value Date   INSULIN 19.8 03/20/2022   INSULIN 22.8 08/03/2021   Lab Results  Component Value Date   TSH 0.878 08/03/2021   CBC    Component Value Date/Time   WBC 9.8 03/20/2022 0756   WBC 7.2 10/30/2017 2320   RBC 5.57 (H) 03/20/2022 0756   RBC 5.31 (H) 10/30/2017 2320   HGB 14.2 03/20/2022 0756   HCT 45.4 03/20/2022 0756   PLT 265 03/20/2022 0756   MCV 82 03/20/2022 0756   MCH 25.5 (L) 03/20/2022 0756   MCH 26.0 10/30/2017 2320   MCHC 31.3 (L) 03/20/2022 0756   MCHC 31.4 10/30/2017 2320   RDW 14.5 03/20/2022 0756   Iron Studies No results found for: "IRON", "TIBC", "FERRITIN", "IRONPCTSAT" Lipid Panel     Component Value Date/Time   CHOL 200 (H) 03/20/2022 0756   TRIG 77 03/20/2022 0756   HDL 46 03/20/2022 0756   CHOLHDL 6.9 (H) 11/02/2020 1552   CHOLHDL 4.8 04/19/2014 0725   VLDL 19 04/19/2014 0725   LDLCALC 140 (H) 03/20/2022 0756   Hepatic Function Panel     Component Value Date/Time   PROT 7.8 03/20/2022 0756   ALBUMIN 4.6 03/20/2022 0756   AST 21 03/20/2022 0756   ALT 13 03/20/2022 0756   ALKPHOS 91 03/20/2022 0756   BILITOT 0.5 03/20/2022 0756   BILIDIR 0.0 10/04/2012 0918      Component Value Date/Time   TSH 0.878 08/03/2021 0847   Nutritional Lab Results  Component Value Date   VD25OH 74.7 03/20/2022   VD25OH 57.4 08/03/2021   VD25OH 30.7 05/13/2019    Follow-Up   Return in about 2 months (around 05/06/2023) for For Weight Mangement with Dr. Rikki Spearing.Marland Kitchen She was informed of the importance of frequent follow up visits to maximize her success with intensive lifestyle modifications for her multiple health conditions.  Attestation Statement    Reviewed by clinician on day of visit: allergies, medications, problem list, medical history, surgical history, family history, social history, and previous encounter notes.     Worthy Rancher, MD

## 2023-03-07 LAB — CMP14+EGFR
ALT: 21 [IU]/L (ref 0–32)
AST: 25 [IU]/L (ref 0–40)
Albumin: 4.5 g/dL (ref 3.8–4.9)
Alkaline Phosphatase: 96 [IU]/L (ref 44–121)
BUN/Creatinine Ratio: 19 (ref 12–28)
BUN: 20 mg/dL (ref 8–27)
Bilirubin Total: 0.7 mg/dL (ref 0.0–1.2)
CO2: 24 mmol/L (ref 20–29)
Calcium: 9.7 mg/dL (ref 8.7–10.3)
Chloride: 100 mmol/L (ref 96–106)
Creatinine, Ser: 1.08 mg/dL — ABNORMAL HIGH (ref 0.57–1.00)
Globulin, Total: 3.3 g/dL (ref 1.5–4.5)
Glucose: 73 mg/dL (ref 70–99)
Potassium: 4.3 mmol/L (ref 3.5–5.2)
Sodium: 141 mmol/L (ref 134–144)
Total Protein: 7.8 g/dL (ref 6.0–8.5)
eGFR: 59 mL/min/{1.73_m2} — ABNORMAL LOW (ref >59)

## 2023-03-07 LAB — LIPID PANEL WITH LDL/HDL RATIO
Cholesterol, Total: 203 mg/dL — ABNORMAL HIGH (ref 100–199)
HDL: 49 mg/dL (ref >39)
LDL Chol Calc (NIH): 138 mg/dL — ABNORMAL HIGH (ref 0–99)
LDL/HDL Ratio: 2.8 ratio (ref 0.0–3.2)
Triglycerides: 89 mg/dL (ref 0–149)
VLDL Cholesterol Cal: 16 mg/dL (ref 5–40)

## 2023-03-07 LAB — HEMOGLOBIN A1C
Est. average glucose Bld gHb Est-mCnc: 120 mg/dL
Hgb A1c MFr Bld: 5.8 % — ABNORMAL HIGH (ref 4.8–5.6)

## 2023-03-07 LAB — INSULIN, RANDOM: INSULIN: 10.4 u[IU]/mL (ref 2.6–24.9)

## 2023-03-07 LAB — VITAMIN D 25 HYDROXY (VIT D DEFICIENCY, FRACTURES): Vit D, 25-Hydroxy: 57.7 ng/mL (ref 30.0–100.0)

## 2023-03-08 ENCOUNTER — Ambulatory Visit: Payer: 59 | Attending: Student | Admitting: Student

## 2023-03-08 ENCOUNTER — Encounter: Payer: Self-pay | Admitting: Student

## 2023-03-08 ENCOUNTER — Other Ambulatory Visit: Payer: Self-pay

## 2023-03-08 VITALS — BP 122/64 | HR 66 | Ht 65.0 in | Wt 161.0 lb

## 2023-03-08 DIAGNOSIS — I10 Essential (primary) hypertension: Secondary | ICD-10-CM

## 2023-03-08 DIAGNOSIS — I422 Other hypertrophic cardiomyopathy: Secondary | ICD-10-CM

## 2023-03-08 DIAGNOSIS — I5032 Chronic diastolic (congestive) heart failure: Secondary | ICD-10-CM

## 2023-03-08 DIAGNOSIS — E785 Hyperlipidemia, unspecified: Secondary | ICD-10-CM

## 2023-03-08 DIAGNOSIS — R0789 Other chest pain: Secondary | ICD-10-CM | POA: Diagnosis not present

## 2023-03-08 MED ORDER — ROSUVASTATIN CALCIUM 10 MG PO TABS: 10.0000 mg | ORAL_TABLET | Freq: Every day | ORAL | 3 refills | Status: DC

## 2023-03-08 NOTE — Patient Instructions (Signed)
Medication Instructions:  Start Crestor 10 mg daily  *If you need a refill on your cardiac medications before your next appointment, please call your pharmacy*   Lab Work: Your physician recommends that you return for lab work in 2-3 months. Fasting lipid panel , LFTs    Testing/Procedures: Your physician has requested that you have an echocardiogram. Echocardiography is a painless test that uses sound waves to create images of your heart. It provides your doctor with information about the size and shape of your heart and how well your heart's chambers and valves are working. This procedure takes approximately one hour. There are no restrictions for this procedure. Please do NOT wear cologne, perfume, aftershave, or lotions (deodorant is allowed). Please arrive 15 minutes prior to your appointment time.  Please note: We ask at that you not bring children with you during ultrasound (echo/ vascular) testing. Due to room size and safety concerns, children are not allowed in the ultrasound rooms during exams. Our front office staff cannot provide observation of children in our lobby area while testing is being conducted. An adult accompanying a patient to their appointment will only be allowed in the ultrasound room at the discretion of the ultrasound technician under special circumstances. We apologize for any inconvenience.    Follow-Up: At [facility], you and your health needs are our priority.  As part of our continuing mission to provide you with exceptional heart care, we have created designated Provider Care Teams.  These Care Teams include your primary Cardiologist (physician) and Advanced Practice Providers (APPs -  Physician Assistants and Nurse Practitioners) who all work together to provide you with the care you need, when you need it.  We recommend signing up for the patient portal called "MyChart".  Sign up information is provided on this After Visit Summary.  MyChart is  used to connect with patients for Virtual Visits (Telemedicine).  Patients are able to view lab/test results, encounter notes, upcoming appointments, etc.  Non-urgent messages can be sent to your provider as well.   To learn more about what you can do with MyChart, go to [email or web address].    Your next appointment:   1 year(s)  Provider:   Olga Millers, MD     Other Instructions

## 2023-03-31 ENCOUNTER — Other Ambulatory Visit: Payer: Self-pay | Admitting: Family Medicine

## 2023-04-02 ENCOUNTER — Encounter: Payer: Self-pay | Admitting: Family Medicine

## 2023-05-02 ENCOUNTER — Ambulatory Visit (HOSPITAL_COMMUNITY): Payer: 59 | Attending: Student

## 2023-05-02 DIAGNOSIS — I422 Other hypertrophic cardiomyopathy: Secondary | ICD-10-CM | POA: Diagnosis present

## 2023-05-02 LAB — ECHOCARDIOGRAM COMPLETE
Area-P 1/2: 2.73 cm2
S' Lateral: 1.4 cm

## 2023-05-02 MED ORDER — PERFLUTREN LIPID MICROSPHERE
1.0000 mL | INTRAVENOUS | Status: AC | PRN
  Administered 2023-05-02: 2 mL via INTRAVENOUS

## 2023-05-07 ENCOUNTER — Ambulatory Visit (INDEPENDENT_AMBULATORY_CARE_PROVIDER_SITE_OTHER): Payer: 59 | Admitting: Internal Medicine

## 2023-05-08 MED ORDER — FUROSEMIDE 40 MG PO TABS: ORAL_TABLET | ORAL | 3 refills | Status: AC

## 2023-05-10 ENCOUNTER — Ambulatory Visit (INDEPENDENT_AMBULATORY_CARE_PROVIDER_SITE_OTHER): Payer: 59 | Admitting: Internal Medicine

## 2023-05-10 ENCOUNTER — Encounter (INDEPENDENT_AMBULATORY_CARE_PROVIDER_SITE_OTHER): Payer: Self-pay | Admitting: Internal Medicine

## 2023-05-10 VITALS — BP 115/74 | HR 66 | Temp 97.8°F | Ht 65.0 in | Wt 157.0 lb

## 2023-05-10 DIAGNOSIS — R7303 Prediabetes: Secondary | ICD-10-CM | POA: Diagnosis not present

## 2023-05-10 DIAGNOSIS — R638 Other symptoms and signs concerning food and fluid intake: Secondary | ICD-10-CM

## 2023-05-10 DIAGNOSIS — I110 Hypertensive heart disease with heart failure: Secondary | ICD-10-CM

## 2023-05-10 DIAGNOSIS — I10 Essential (primary) hypertension: Secondary | ICD-10-CM

## 2023-05-10 DIAGNOSIS — G479 Sleep disorder, unspecified: Secondary | ICD-10-CM

## 2023-05-10 DIAGNOSIS — E669 Obesity, unspecified: Secondary | ICD-10-CM | POA: Diagnosis not present

## 2023-05-10 DIAGNOSIS — F418 Other specified anxiety disorders: Secondary | ICD-10-CM

## 2023-05-10 DIAGNOSIS — E66811 Obesity, class 1: Secondary | ICD-10-CM

## 2023-05-10 DIAGNOSIS — Z6833 Body mass index (BMI) 33.0-33.9, adult: Secondary | ICD-10-CM

## 2023-05-10 DIAGNOSIS — I5030 Unspecified diastolic (congestive) heart failure: Secondary | ICD-10-CM

## 2023-05-10 MED ORDER — WEGOVY 1.7 MG/0.75ML ~~LOC~~ SOAJ: 1.7000 mg | SUBCUTANEOUS | 1 refills | Status: DC

## 2023-05-10 MED ORDER — ESCITALOPRAM OXALATE 5 MG PO TABS: 5.0000 mg | ORAL_TABLET | Freq: Every day | ORAL | 0 refills | Status: DC

## 2023-05-10 NOTE — Assessment & Plan Note (Signed)
Blood pressure is well-controlled.  Currently on lisinopril without any adverse effects continue current regimen

## 2023-05-10 NOTE — Assessment & Plan Note (Signed)
Patient counseled on sleep hygiene.  She was provided a handout on tips for better sleep.  We recommend that she did not take Lasix right before bedtime but when she gets out of work to avoid disruption in sleep.  She may also try magnesium glycinate or melatonin 1 hour before bedtime.  Patient will also be started on Lexapro 5 mg in the evening to help with anxiety.

## 2023-05-10 NOTE — Progress Notes (Signed)
Office: [phone]  /  Fax: [phone]  Weight Summary And Biometrics  Vitals Temp: 97.8 F (36.6 C) BP: 115/74 Pulse Rate: 66 SpO2: 100 %   Anthropometric Measurements Height: 5\' 5"  (1.651 m) Weight: 157 lb (71.2 kg) BMI (Calculated): 26.13 Weight at Last Visit: 154 lb Weight Lost Since Last Visit: 0 lb Weight Gained Since Last Visit: 3 lb Starting Weight: 202 lb Total Weight Loss (lbs): 45 lb (20.4 kg) Peak Weight: 220 lb   Body Composition  Body Fat %: 31.9 % Fat Mass (lbs): 50.2 lbs Muscle Mass (lbs): 102 lbs Total Body Water (lbs): 66 lbs Visceral Fat Rating : 8   The 10-year ASCVD risk score (Arnett DK, et al., 2019) is: 5.4%  No data recorded Today's Visit #: 22  Starting Date: 08/03/21   Subjective   Chief Complaint: Obesity  [name] is here to discuss her progress with her obesity treatment plan. She is on the the Category 2 Plan and states she is following her eating plan approximately 40 % of the time. She states she is exercising 30 minutes 3 times per week.  Interval History:   Discussed the use of AI scribe software for clinical note transcription with the patient, who gave verbal consent to proceed.  History of Present Illness   The patient, with a history of obesity, hypertension, prediabetes, and diastolic heart failure, presents for medical weight management. She reports recent weight gain of three pounds, attributing it to significant life stressors, including the loss of a close acquaintance and caring for a sick friend. She acknowledges a lapse in her usual dietary habits and expresses surprise that the weight gain was not more significant.  The patient also reports poor sleep quality, characterized by frequent awakenings during the night. She attributes this to both a racing mind and the need to urinate, the latter being a side effect of her diuretic medication (Lasix). She has attempted to mitigate these sleep disturbances by listening  to soothing sounds, such as rain or snow, on Youtube.  Despite these challenges, the patient has made significant strides in her health management. She has successfully lowered her A1c from the diabetic range to the prediabetic range, and her body mass index (BMI) is now 26. She also reports that the thickening of her heart wall, a previous concern, has not worsened.  The patient is currently on rosuvastatin, which she believes may be causing a runny nose. She also takes rosuvastatin for her LDL cholesterol, which was last measured at 138. Despite her healthy eating habits and significant weight loss, her LDL remains elevated, likely due to genetic factors.  The patient is committed to continuing her health journey, expressing a desire to get back on track with her diet and exercise regimen. She is also open to trying new strategies to manage her stress and improve her sleep, including adjusting the timing of her Lasix dose and potentially starting a low-dose SSRI.      Orexigenic Control:  Denies problems with appetite and hunger signals.  Denies problems with satiety and satiation.  Denies problems with eating patterns and portion control.  Denies abnormal cravings. Denies feeling deprived or restricted.   Barriers identified: work schedule, inadequate sleep, moderate to high levels of stress, and menopause.   Pharmacotherapy for weight loss: She is currently taking Wegovy with adequate clinical response  and without side effects..   Assessment and Plan   Treatment Plan For Obesity:  Recommended Dietary Goals  [name] is currently in  the action stage of change. As such, her goal is to continue weight management plan. She has agreed to: continue current plan  Behavioral Intervention  We discussed the following Behavioral Modification Strategies today: continue to work on maintaining a reduced calorie state, getting the recommended amount of protein, incorporating whole foods, making  healthy choices, staying well hydrated and practicing mindfulness when eating..  Additional resources provided today: None  Recommended Physical Activity Goals  [name] has been advised to work up to 150 minutes of moderate intensity aerobic activity a week and strengthening exercises 2-3 times per week for cardiovascular health, weight loss maintenance and preservation of muscle mass.   She has agreed to :  Think about enjoyable ways to increase daily physical activity and overcoming barriers to exercise and Increase physical activity in their day and reduce sedentary time (increase NEAT).  Pharmacotherapy  We discussed various medication options to help [name] with her weight loss efforts and we both agreed to : continue current anti-obesity medication regimen  Associated Conditions Addressed and Impacted by Obesity Treatment  Prediabetes Assessment & Plan: Her hemoglobin A1c is down to 5.8 highest recorded was 6.4 back in 2023 this represents a significant improvement in glycemic control.  She is currently on Wegovy for pharmacoprophylaxis and weight management and will continue medication at current dose.  Patient also aware of the importance of maintaining a diet with a low glycemic load.   Abnormal food appetite Assessment & Plan: Improved on Wegovy 1.7 mg once a week.  She has increased orexigenic signaling, impaired satiety and inhibitory control. This is secondary to an abnormal energy regulation system and pathological neurohormonal pathways characteristic of excess adiposity.  In addition to nutritional and behavioral strategies she benefits from ongoing pharmacotherapy.    Orders: -     Wegovy; Inject 1.7 mg into the skin once a week.  Dispense: 3 mL; Refill: 1  Generalized obesity with starting BMI of 34 -     Wegovy; Inject 1.7 mg into the skin once a week.  Dispense: 3 mL; Refill: 1  Essential hypertension Assessment & Plan: Blood pressure is well-controlled.  Currently  on lisinopril without any adverse effects continue current regimen   Class 1 obesity with serious comorbidity and body mass index (BMI) of 33.0 to 33.9 in adult, unspecified obesity type Assessment & Plan: See obesity treatment plan   Disturbance of sleep Assessment & Plan: Patient counseled on sleep hygiene.  She was provided a handout on tips for better sleep.  We recommend that she did not take Lasix right before bedtime but when she gets out of work to avoid disruption in sleep.  She may also try magnesium glycinate or melatonin 1 hour before bedtime.  Patient will also be started on Lexapro 5 mg in the evening to help with anxiety.   Situational anxiety Assessment & Plan: Patient has multiple sources of stress.  She is receiving psychotherapy.  I feel that she would benefit from SSRI treatment short-term this may also help her with her sleep and some stress eating.  After discussion of benefits and side effect she will be started on Lexapro 5 mg 1 tablet in the evening.  Orders: -     Escitalopram Oxalate; Take 1 tablet (5 mg total) by mouth at bedtime.  Dispense: 30 tablet; Refill: 0  Diastolic congestive heart failure, unspecified HF chronicity (HCC) Assessment & Plan: Patient recently had an echocardiogram that did not show any significant changes and stable findings.  She has  been taking Lasix before bedtime which is disrupting her sleep.  I recommend that she take it after work and time it for about 6 hours.  This may help her get better sleep.  Present time no signs or symptoms of syncope or heart failure.      Objective   Physical Exam:  Blood pressure 115/74, pulse 66, temperature 97.8 F (36.6 C), height 5\' 5"  (1.651 m), weight 157 lb (71.2 kg), SpO2 100%. Body mass index is 26.13 kg/m.  General: She is overweight, cooperative, alert, well developed, and in no acute distress. PSYCH: Has normal mood, affect and thought process.   HEENT: EOMI, sclerae are  anicteric. Lungs: Normal breathing effort, no conversational dyspnea. Extremities: No edema.  Neurologic: No gross sensory or motor deficits. No tremors or fasciculations noted.    Diagnostic Data Reviewed:  BMET    Component Value Date/Time   NA 141 03/06/2023 1127   K 4.3 03/06/2023 1127   CL 100 03/06/2023 1127   CO2 24 03/06/2023 1127   GLUCOSE 73 03/06/2023 1127   GLUCOSE 105 (H) 10/30/2017 2320   BUN 20 03/06/2023 1127   CREATININE 1.08 (H) 03/06/2023 1127   CREATININE 1.01 01/22/2015 1033   CALCIUM 9.7 03/06/2023 1127   GFRNONAA 79 05/13/2019 1340   GFRAA 91 05/13/2019 1340   Lab Results  Component Value Date   HGBA1C 5.8 (H) 03/06/2023   HGBA1C 5.9 (H) 04/19/2014   Lab Results  Component Value Date   INSULIN 10.4 03/06/2023   INSULIN 22.8 08/03/2021   Lab Results  Component Value Date   TSH 0.878 08/03/2021   CBC    Component Value Date/Time   WBC 9.8 03/20/2022 0756   WBC 7.2 10/30/2017 2320   RBC 5.57 (H) 03/20/2022 0756   RBC 5.31 (H) 10/30/2017 2320   HGB 14.2 03/20/2022 0756   HCT 45.4 03/20/2022 0756   PLT 265 03/20/2022 0756   MCV 82 03/20/2022 0756   MCH 25.5 (L) 03/20/2022 0756   MCH 26.0 10/30/2017 2320   MCHC 31.3 (L) 03/20/2022 0756   MCHC 31.4 10/30/2017 2320   RDW 14.5 03/20/2022 0756   Iron Studies No results found for: "IRON", "TIBC", "FERRITIN", "IRONPCTSAT" Lipid Panel     Component Value Date/Time   CHOL 203 (H) 03/06/2023 1127   TRIG 89 03/06/2023 1127   HDL 49 03/06/2023 1127   CHOLHDL 6.9 (H) 11/02/2020 1552   CHOLHDL 4.8 04/19/2014 0725   VLDL 19 04/19/2014 0725   LDLCALC 138 (H) 03/06/2023 1127   Hepatic Function Panel     Component Value Date/Time   PROT 7.8 03/06/2023 1127   ALBUMIN 4.5 03/06/2023 1127   AST 25 03/06/2023 1127   ALT 21 03/06/2023 1127   ALKPHOS 96 03/06/2023 1127   BILITOT 0.7 03/06/2023 1127   BILIDIR 0.0 10/04/2012 0918      Component Value Date/Time   TSH 0.878 08/03/2021 0847    Nutritional Lab Results  Component Value Date   VD25OH 57.7 03/06/2023   VD25OH 74.7 03/20/2022   VD25OH 57.4 08/03/2021    Follow-Up   Return in about 4 weeks (around 06/07/2023) for For Weight Mangement with Dr. Rikki Spearing.Marland Kitchen She was informed of the importance of frequent follow up visits to maximize her success with intensive lifestyle modifications for her multiple health conditions.  Attestation Statement   Reviewed by clinician on day of visit: allergies, medications, problem list, medical history, surgical history, family history, social history, and previous encounter notes.  Worthy Rancher, MD

## 2023-05-10 NOTE — Assessment & Plan Note (Signed)
Patient recently had an echocardiogram that did not show any significant changes and stable findings.  She has been taking Lasix before bedtime which is disrupting her sleep.  I recommend that she take it after work and time it for about 6 hours.  This may help her get better sleep.  Present time no signs or symptoms of syncope or heart failure.

## 2023-05-10 NOTE — Assessment & Plan Note (Signed)
Her hemoglobin A1c is down to 5.8 highest recorded was 6.4 back in 2023 this represents a significant improvement in glycemic control.  She is currently on Wegovy for pharmacoprophylaxis and weight management and will continue medication at current dose.  Patient also aware of the importance of maintaining a diet with a low glycemic load.

## 2023-05-10 NOTE — Assessment & Plan Note (Signed)
Patient has multiple sources of stress.  She is receiving psychotherapy.  I feel that she would benefit from SSRI treatment short-term this may also help her with her sleep and some stress eating.  After discussion of benefits and side effect she will be started on Lexapro 5 mg 1 tablet in the evening.

## 2023-05-10 NOTE — Assessment & Plan Note (Signed)
Improved on Wegovy 1.7 mg once a week.  She has increased orexigenic signaling, impaired satiety and inhibitory control. This is secondary to an abnormal energy regulation system and pathological neurohormonal pathways characteristic of excess adiposity.  In addition to nutritional and behavioral strategies she benefits from ongoing pharmacotherapy.

## 2023-05-10 NOTE — Assessment & Plan Note (Signed)
 See obesity treatment plan

## 2023-06-01 ENCOUNTER — Other Ambulatory Visit (INDEPENDENT_AMBULATORY_CARE_PROVIDER_SITE_OTHER): Payer: Self-pay | Admitting: Internal Medicine

## 2023-06-01 DIAGNOSIS — F418 Other specified anxiety disorders: Secondary | ICD-10-CM

## 2023-06-20 ENCOUNTER — Ambulatory Visit (INDEPENDENT_AMBULATORY_CARE_PROVIDER_SITE_OTHER): Payer: 59 | Admitting: Internal Medicine

## 2023-06-20 ENCOUNTER — Encounter (INDEPENDENT_AMBULATORY_CARE_PROVIDER_SITE_OTHER): Payer: Self-pay | Admitting: Internal Medicine

## 2023-06-20 VITALS — BP 106/69 | HR 56 | Temp 98.3°F | Ht 65.0 in | Wt 154.0 lb

## 2023-06-20 DIAGNOSIS — I10 Essential (primary) hypertension: Secondary | ICD-10-CM

## 2023-06-20 DIAGNOSIS — G479 Sleep disorder, unspecified: Secondary | ICD-10-CM

## 2023-06-20 DIAGNOSIS — R638 Other symptoms and signs concerning food and fluid intake: Secondary | ICD-10-CM

## 2023-06-20 DIAGNOSIS — E66811 Obesity, class 1: Secondary | ICD-10-CM

## 2023-06-20 DIAGNOSIS — R7303 Prediabetes: Secondary | ICD-10-CM

## 2023-06-20 DIAGNOSIS — E669 Obesity, unspecified: Secondary | ICD-10-CM

## 2023-06-20 DIAGNOSIS — F418 Other specified anxiety disorders: Secondary | ICD-10-CM

## 2023-06-20 DIAGNOSIS — Z6833 Body mass index (BMI) 33.0-33.9, adult: Secondary | ICD-10-CM

## 2023-06-20 MED ORDER — ESCITALOPRAM OXALATE 10 MG PO TABS: 10.0000 mg | ORAL_TABLET | Freq: Every day | ORAL | 0 refills | Status: DC

## 2023-06-20 MED ORDER — WEGOVY 1.7 MG/0.75ML ~~LOC~~ SOAJ: 1.7000 mg | SUBCUTANEOUS | 1 refills | Status: DC

## 2023-06-20 NOTE — Progress Notes (Signed)
 Office: [phone]  /  Fax: [phone]  Weight Summary And Biometrics  Vitals Temp: 98.3 F (36.8 C) BP: 106/69 Pulse Rate: (!) 56 SpO2: 97 %   Anthropometric Measurements Height: 5\' 5"  (1.651 m) Weight: 154 lb (69.9 kg) BMI (Calculated): 25.63 Weight at Last Visit: 157 lb Weight Lost Since Last Visit: 3 lb Weight Gained Since Last Visit: 0 Starting Weight: 202 lb Total Weight Loss (lbs): 48 lb (21.8 kg) Peak Weight: 220 lb   Body Composition  Body Fat %: 30.3 % Fat Mass (lbs): 47 lbs Muscle Mass (lbs): 102.4 lbs Total Body Water (lbs): 64.2 lbs Visceral Fat Rating : 7    No data recorded Today's Visit #: 23  Starting Date: 08/03/21   Subjective   Chief Complaint: Obesity  Interval History Discussed the use of AI scribe software for clinical note transcription with the patient, who gave verbal consent to proceed.  History of Present Illness   [name] is a 63 year old female who presents for medical weight management.  She has lost three pounds since her last office visit and currently weighs 154 pounds. She adheres to the category three plan 90-100% of the time and walks 5,000 steps daily. She is pleased with her weight loss, as she had previously gained three pounds. She is taking Wegovy for appetite control and prediabetes, with no reported side effects.  She is currently taking a low dose of Lexapro for anxiety, which she has been on for about four weeks. She notes some improvement in anxiety but still experiences significant stress and decision-making challenges. She has adjusted the timing of her diuretic medication to earlier in the day to avoid nocturnal urination. Her blood pressure is well-controlled with lisinopril. Her sleep is inconsistent; she sometimes sleeps well but often wakes up during the night. She typically goes to bed around 10:30 PM, wakes up between 3:00 AM and 4:30 AM, and then again at 5:30 AM. 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  She is working with a therapist to manage her symptoms of anxiety and has a history of slipping into depression but feels she is 'bouncing back'. She is actively trying to maintain a healthy lifestyle by not skipping meals, even when not hungry, and incorporating physical activity into her routine. She is considering training for a 5K marathon and is exploring other activities like pickleball and yoga to stay active and engaged.       Challenges affecting patient progress: work schedule and inadequate sleep.    Pharmacotherapy for weight management: She is currently taking Wegovy with adequate clinical response  and without side effects..   Assessment and Plan   Treatment Plan For Obesity:  Recommended Dietary Goals  Naveh is currently in the action stage of change. As such, her goal is to continue weight management plan. She has agreed to: continue current plan  Behavioral Health and Counseling  We discussed the following behavioral modification strategies today: continue to work on maintaining a reduced calorie state, getting the recommended amount of protein, incorporating whole foods, making healthy choices, staying well hydrated and practicing mindfulness when eating..  Additional education and resources provided today: None  Recommended Physical Activity Goals  Kathya has been advised to work up to 150 minutes of moderate intensity aerobic activity a week and strengthening exercises 2-3 times per week for cardiovascular health, weight loss maintenance and  preservation of muscle mass.   She has agreed to :  continue to gradually increase the amount and intensity of exercise routine  Pharmacotherapy  We discussed various medication options to help Emalia with her weight loss efforts and we both agreed to : adequate clinical response to current dose, continue current  regimen  Associated Conditions Impacted by Obesity Treatment  1. Class 1 obesity with serious comorbidity and body mass index (BMI) of 33.0 to 33.9 in adult, unspecified obesity type - Refer to obesity management plan - Continue with current weight management strategy. - Discussed adjustments to overcome identified barriers. - Reinforce dietary and exercise goals.   2. Prediabetes Improved continue [facility] for pharmacoprophylaxis.  No side effects reported  3. Abnormal food appetite Improved on Wegovy continue current treatment  4. Disturbance of sleep Continue to work on sleep hygiene do a trial of magnesium glycinate.  Has switch diuretic to an earlier time.  5. Essential hypertension Well-controlled on lisinopril monitor for orthostasis  6.  Anxiety /stress Somewhat better.  Increase Lexapro to 10 mg in the evening    Objective   Physical Exam:  Blood pressure 106/69, pulse (!) 56, temperature 98.3 F (36.8 C), height 5\' 5"  (1.651 m), weight 154 lb (69.9 kg), SpO2 97%. Body mass index is 25.63 kg/m.  General: She is overweight, cooperative, alert, well developed, and in no acute distress. PSYCH: Has normal mood, affect and thought process.   HEENT: EOMI, sclerae are anicteric. Lungs: Normal breathing effort, no conversational dyspnea. Extremities: No edema.  Neurologic: No gross sensory or motor deficits. No tremors or fasciculations noted.    Diagnostic Data Reviewed:  BMET    Component Value Date/Time   NA 141 03/06/2023 1127   K 4.3 03/06/2023 1127   CL 100 03/06/2023 1127   CO2 24 03/06/2023 1127   GLUCOSE 73 03/06/2023 1127   GLUCOSE 105 (H) 10/30/2017 2320   BUN 20 03/06/2023 1127   CREATININE 1.08 (H) 03/06/2023 1127   CREATININE 1.01 01/22/2015 1033   CALCIUM 9.7 03/06/2023 1127   GFRNONAA 79 05/13/2019 1340   GFRAA 91 05/13/2019 1340   Lab Results  Component Value Date   HGBA1C 5.8 (H) 03/06/2023   HGBA1C 5.9 (H) 04/19/2014   Lab Results   Component Value Date   INSULIN 10.4 03/06/2023   INSULIN 22.8 08/03/2021   Lab Results  Component Value Date   TSH 0.878 08/03/2021   CBC    Component Value Date/Time   WBC 9.8 03/20/2022 0756   WBC 7.2 10/30/2017 2320   RBC 5.57 (H) 03/20/2022 0756   RBC 5.31 (H) 10/30/2017 2320   HGB 14.2 03/20/2022 0756   HCT 45.4 03/20/2022 0756   PLT 265 03/20/2022 0756   MCV 82 03/20/2022 0756   MCH 25.5 (L) 03/20/2022 0756   MCH 26.0 10/30/2017 2320   MCHC 31.3 (L) 03/20/2022 0756   MCHC 31.4 10/30/2017 2320   RDW 14.5 03/20/2022 0756   Iron Studies No results found for: "IRON", "TIBC", "FERRITIN", "IRONPCTSAT" Lipid Panel     Component Value Date/Time   CHOL 203 (H) 03/06/2023 1127   TRIG 89 03/06/2023 1127   HDL 49 03/06/2023 1127   CHOLHDL 6.9 (H) 11/02/2020 1552   CHOLHDL 4.8 04/19/2014 0725   VLDL 19 04/19/2014 0725   LDLCALC 138 (H) 03/06/2023 1127   Hepatic Function Panel     Component Value Date/Time   PROT 7.8 03/06/2023 1127   ALBUMIN 4.5 03/06/2023 1127   AST  25 03/06/2023 1127   ALT 21 03/06/2023 1127   ALKPHOS 96 03/06/2023 1127   BILITOT 0.7 03/06/2023 1127   BILIDIR 0.0 10/04/2012 0918      Component Value Date/Time   TSH 0.878 08/03/2021 0847   Nutritional Lab Results  Component Value Date   VD25OH 57.7 03/06/2023   VD25OH 74.7 03/20/2022   VD25OH 57.4 08/03/2021    Follow-Up   Return in about 4 weeks (around 07/18/2023) for For Weight Mangement with Dr. Rikki Spearing.Marland Kitchen She was informed of the importance of frequent follow up visits to maximize her success with intensive lifestyle modifications for her multiple health conditions.  Attestation Statement   Reviewed by clinician on day of visit: allergies, medications, problem list, medical history, surgical history, family history, social history, and previous encounter notes.     Worthy Rancher, MD

## 2023-07-04 ENCOUNTER — Encounter: Payer: Self-pay | Admitting: Family Medicine

## 2023-07-04 ENCOUNTER — Other Ambulatory Visit: Payer: Self-pay | Admitting: Family Medicine

## 2023-07-15 ENCOUNTER — Other Ambulatory Visit (INDEPENDENT_AMBULATORY_CARE_PROVIDER_SITE_OTHER): Payer: Self-pay | Admitting: Internal Medicine

## 2023-07-15 DIAGNOSIS — F418 Other specified anxiety disorders: Secondary | ICD-10-CM

## 2023-07-18 ENCOUNTER — Other Ambulatory Visit: Payer: Self-pay | Admitting: Family Medicine

## 2023-07-18 ENCOUNTER — Ambulatory Visit (INDEPENDENT_AMBULATORY_CARE_PROVIDER_SITE_OTHER): Payer: 59 | Admitting: Internal Medicine

## 2023-07-18 ENCOUNTER — Encounter (INDEPENDENT_AMBULATORY_CARE_PROVIDER_SITE_OTHER): Payer: Self-pay | Admitting: Internal Medicine

## 2023-07-18 VITALS — BP 113/71 | HR 88 | Temp 98.2°F | Ht 65.0 in | Wt 155.0 lb

## 2023-07-18 DIAGNOSIS — R7303 Prediabetes: Secondary | ICD-10-CM | POA: Diagnosis not present

## 2023-07-18 DIAGNOSIS — F419 Anxiety disorder, unspecified: Secondary | ICD-10-CM

## 2023-07-18 DIAGNOSIS — R638 Other symptoms and signs concerning food and fluid intake: Secondary | ICD-10-CM

## 2023-07-18 DIAGNOSIS — I10 Essential (primary) hypertension: Secondary | ICD-10-CM

## 2023-07-18 DIAGNOSIS — E669 Obesity, unspecified: Secondary | ICD-10-CM | POA: Insufficient documentation

## 2023-07-18 DIAGNOSIS — F418 Other specified anxiety disorders: Secondary | ICD-10-CM

## 2023-07-18 DIAGNOSIS — Z6825 Body mass index (BMI) 25.0-25.9, adult: Secondary | ICD-10-CM

## 2023-07-18 MED ORDER — ESCITALOPRAM OXALATE 10 MG PO TABS: 10.0000 mg | ORAL_TABLET | Freq: Every day | ORAL | 0 refills | Status: DC

## 2023-07-18 MED ORDER — WEGOVY 1.7 MG/0.75ML ~~LOC~~ SOAJ: 1.7000 mg | SUBCUTANEOUS | 1 refills | Status: DC

## 2023-07-18 MED ORDER — LISINOPRIL 10 MG PO TABS: 10.0000 mg | ORAL_TABLET | Freq: Every day | ORAL | 0 refills | Status: DC

## 2023-07-18 NOTE — Progress Notes (Signed)
 Office: [phone]  /  Fax: [phone]  Weight Summary And Biometrics  Vitals Temp: 98.2 F (36.8 C) BP: 113/71 Pulse Rate: 88 SpO2: 99 %   Anthropometric Measurements Height: 5\' 5"  (1.651 m) Weight: 155 lb (70.3 kg) BMI (Calculated): 25.79 Weight at Last Visit: 157 lb Weight Lost Since Last Visit: 0 Weight Gained Since Last Visit: 1 lb Starting Weight: 202 lb Total Weight Loss (lbs): 47 lb (21.3 kg) Peak Weight: 220 lb   Body Composition  Body Fat %: 30.9 % Fat Mass (lbs): 47.8 lbs Muscle Mass (lbs): 101.8 lbs Total Body Water (lbs): 63.6 lbs Visceral Fat Rating : 7    No data recorded Today's Visit #: 24  Starting Date: 08/03/21   Subjective   Chief Complaint: Obesity  Interval History Discussed the use of AI scribe software for clinical note transcription with the patient, who gave verbal consent to proceed.  History of Present Illness [name] is a 63 year old female with obesity who presents for medical weight management.  She has been affected by obesity and associated conditions include prediabetes, abnormal food appetite, hypertension, and anxiety. Since her last office visit, she has gained one pound. She follows a reduced calorie nutrition plan 70-80% of the time, tracks her calories, incorporates more whole foods, and maintains adequate hydration. She sometimes skips meals and exercises six days a week for about fifteen minutes, combining strength training and cardio. Her current medication includes Wegovy for weight management. She focuses on a low carb, moderate protein, and moderate healthy fat diet, aiming for 130 grams of carbs per day. She frequently consumes protein shakes with added fruit and sometimes fiber.  She reports getting adequate sleep and notes having some stress. Her Lexapro dosage was increased to 10 mg to help with anxiety and stress, which has helped her sleep significantly. She may wake up once or twice a week at  about one o'clock but goes right back to sleep. She notes a slight improvement in stress and anxiety, describing her mind as 'a little bit quieter'.  She has been jogging and stretching but reports a 'twinge of pain' in her hip that won't go away. She is preparing to do a marathon and has been slowly increasing her activity.  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 She plans to hire additional help to manage her workload and hopes to take some vacation time in the summer. She has been in this field for 28 years and is accustomed to dealing with various personalities.    Challenges affecting patient progress: none.    Pharmacotherapy for weight management: She is currently taking Wegovy with adequate clinical response  and without side effects..   Assessment and Plan   Treatment Plan For Obesity:  Recommended Dietary Goals  [name] is currently in the action stage of change. As such, her goal is to continue weight management plan. She has agreed to: keep a food journal with a target of  1400 calories per day and 90-120 grams of protein per day or 30-40 grams per meal.  Behavioral Health and Counseling  We discussed the following behavioral modification strategies today: continue to work on maintaining a reduced calorie state, getting the recommended amount of protein, incorporating whole foods, making healthy choices, staying well hydrated and practicing mindfulness when eating..  Additional education and resources provided today: None  Recommended Physical Activity Goals  [name] has been advised to  work up to 150 minutes of moderate intensity aerobic activity a week and strengthening exercises 2-3 times per week for cardiovascular health, weight loss maintenance and preservation of muscle mass.   She has agreed to :  continue to gradually increase the amount and intensity of exercise  routine  Pharmacotherapy  We discussed various medication options to help [name] with her weight loss efforts and we both agreed to : adequate clinical response to current dose, continue current regimen  Associated Conditions Impacted by Obesity Treatment  There are no diagnoses linked to this encounter.  Assessment and Plan Assessment & Plan Obesity She is affected by obesity and is currently engaged in a medical weight management program. She has gained one pound since the last visit. She follows a reduced-calorie nutrition plan 70-80% of the time, tracks her calories, incorporates more whole foods, and maintains adequate hydration. She exercises six days a week for about 15 minutes, combining strength training and cardio. Her body mass index is now 25, indicating a healthy weight. The goal is to maintain her current weight around 154-155 pounds. She plans to maintain her weight and not pursue further weight loss. - Continue current reduced-calorie nutrition plan with a focus on maintaining weight. - Maintain calorie intake around 1400 calories per day for maintenance, with an option to reduce to 1300 for a slight deficit. - Consume approximately 130 grams of carbohydrates per day, with a macronutrient distribution of 30% protein, 40% carbohydrates, and 30% healthy fats. - Incorporate variety in exercise routine to prevent plateau, including strength training, cardio, yoga, and Pilates. - Refill [id number] prescription for maintenance.  Prediabetes She has prediabetes, which is being managed as part of her weight management program. Her blood sugar levels are being monitored, and she is following a low-carb, moderate-protein, and moderate-fat diet to help manage her condition.  Hypertension Her blood pressure is currently well-controlled. She is following a healthy lifestyle, including diet and exercise, which contributes to her blood pressure management.  Anxiety She has anxiety, which has  been managed with Lexapro. Her dosage was increased to 10 mg at the last visit, and she reports improved sleep and a slight reduction in stress and anxiety levels. She is experiencing better sleep quality, which contributes to her overall well-being. Improved sleep is emphasized as crucial for mental and physical health, aiding in memory formation, toxin clearance, and hormone regulation. - Continue Lexapro at 10 mg daily. - Refill Lexapro prescription.      Objective   Physical Exam:  Blood pressure 113/71, pulse 88, temperature 98.2 F (36.8 C), height 5\' 5"  (1.651 m), weight 155 lb (70.3 kg), SpO2 99%. Body mass index is 25.79 kg/m.  General: She is overweight, cooperative, alert, well developed, and in no acute distress. PSYCH: Has normal mood, affect and thought process.   HEENT: EOMI, sclerae are anicteric. Lungs: Normal breathing effort, no conversational dyspnea. Extremities: No edema.  Neurologic: No gross sensory or motor deficits. No tremors or fasciculations noted.    Diagnostic Data Reviewed:  BMET    Component Value Date/Time   NA 141 03/06/2023 1127   K 4.3 03/06/2023 1127   CL 100 03/06/2023 1127   CO2 24 03/06/2023 1127   GLUCOSE 73 03/06/2023 1127   GLUCOSE 105 (H) 10/30/2017 2320   BUN 20 03/06/2023 1127   CREATININE 1.08 (H) 03/06/2023 1127   CREATININE 1.01 01/22/2015 1033   CALCIUM 9.7 03/06/2023 1127   GFRNONAA 79 05/13/2019 1340   GFRAA 91 05/13/2019 1340  Lab Results  Component Value Date   HGBA1C 5.8 (H) 03/06/2023   HGBA1C 5.9 (H) 04/19/2014   Lab Results  Component Value Date   INSULIN 10.4 03/06/2023   INSULIN 22.8 08/03/2021   Lab Results  Component Value Date   TSH 0.878 08/03/2021   CBC    Component Value Date/Time   WBC 9.8 03/20/2022 0756   WBC 7.2 10/30/2017 2320   RBC 5.57 (H) 03/20/2022 0756   RBC 5.31 (H) 10/30/2017 2320   HGB 14.2 03/20/2022 0756   HCT 45.4 03/20/2022 0756   PLT 265 03/20/2022 0756   MCV 82  03/20/2022 0756   MCH 25.5 (L) 03/20/2022 0756   MCH 26.0 10/30/2017 2320   MCHC 31.3 (L) 03/20/2022 0756   MCHC 31.4 10/30/2017 2320   RDW 14.5 03/20/2022 0756   Iron Studies No results found for: "IRON", "TIBC", "FERRITIN", "IRONPCTSAT" Lipid Panel     Component Value Date/Time   CHOL 203 (H) 03/06/2023 1127   TRIG 89 03/06/2023 1127   HDL 49 03/06/2023 1127   CHOLHDL 6.9 (H) 11/02/2020 1552   CHOLHDL 4.8 04/19/2014 0725   VLDL 19 04/19/2014 0725   LDLCALC 138 (H) 03/06/2023 1127   Hepatic Function Panel     Component Value Date/Time   PROT 7.8 03/06/2023 1127   ALBUMIN 4.5 03/06/2023 1127   AST 25 03/06/2023 1127   ALT 21 03/06/2023 1127   ALKPHOS 96 03/06/2023 1127   BILITOT 0.7 03/06/2023 1127   BILIDIR 0.0 10/04/2012 0918      Component Value Date/Time   TSH 0.878 08/03/2021 0847   Nutritional Lab Results  Component Value Date   VD25OH 57.7 03/06/2023   VD25OH 74.7 03/20/2022   VD25OH 57.4 08/03/2021    Medications: Outpatient Encounter Medications as of 07/18/2023  Medication Sig   aspirin 81 MG EC tablet Take 81 mg by mouth daily.     Cholecalciferol (VITAMIN D) 50 MCG (2000 UT) tablet Take 2,000 Units by mouth daily.   escitalopram (LEXAPRO) 10 MG tablet Take 1 tablet (10 mg total) by mouth at bedtime.   furosemide (LASIX) 40 MG tablet 1 tablet by mouth once daily and may take an extra tablet as needed for swelling   lisinopril (ZESTRIL) 10 MG tablet TAKE 1 TABLET BY MOUTH EVERY DAY   Omega-3 Fatty Acids (FISH OIL PO) Take 1 tablet by mouth daily.   prednisoLONE acetate (PRED FORTE) 1 % ophthalmic suspension PLEASE SEE ATTACHED FOR DETAILED DIRECTIONS   Semaglutide-Weight Management (WEGOVY) 1.7 MG/0.75ML SOAJ Inject 1.7 mg into the skin once a week.   rosuvastatin (CRESTOR) 10 MG tablet Take 1 tablet (10 mg total) by mouth daily.   No facility-administered encounter medications on file as of 07/18/2023.     Follow-Up   No follow-ups on file.Marland Kitchen She  was informed of the importance of frequent follow up visits to maximize her success with intensive lifestyle modifications for her multiple health conditions.  Attestation Statement   Reviewed by clinician on day of visit: allergies, medications, problem list, medical history, surgical history, family history, social history, and previous encounter notes.     Worthy Rancher, MD

## 2023-07-30 ENCOUNTER — Other Ambulatory Visit: Payer: Self-pay | Admitting: Family Medicine

## 2023-08-15 ENCOUNTER — Ambulatory Visit (INDEPENDENT_AMBULATORY_CARE_PROVIDER_SITE_OTHER): Payer: 59 | Admitting: Internal Medicine

## 2023-08-15 ENCOUNTER — Encounter (INDEPENDENT_AMBULATORY_CARE_PROVIDER_SITE_OTHER): Payer: Self-pay | Admitting: Internal Medicine

## 2023-08-15 VITALS — BP 106/70 | HR 66 | Temp 98.2°F | Ht 65.0 in | Wt 155.0 lb

## 2023-08-15 DIAGNOSIS — Z6833 Body mass index (BMI) 33.0-33.9, adult: Secondary | ICD-10-CM

## 2023-08-15 DIAGNOSIS — E66811 Obesity, class 1: Secondary | ICD-10-CM | POA: Diagnosis not present

## 2023-08-15 DIAGNOSIS — R638 Other symptoms and signs concerning food and fluid intake: Secondary | ICD-10-CM | POA: Diagnosis not present

## 2023-08-15 DIAGNOSIS — F418 Other specified anxiety disorders: Secondary | ICD-10-CM | POA: Diagnosis not present

## 2023-08-15 DIAGNOSIS — I10 Essential (primary) hypertension: Secondary | ICD-10-CM | POA: Diagnosis not present

## 2023-08-15 MED ORDER — ESCITALOPRAM OXALATE 10 MG PO TABS: 10.0000 mg | ORAL_TABLET | Freq: Every day | ORAL | 0 refills | Status: DC

## 2023-08-15 MED ORDER — WEGOVY 1.7 MG/0.75ML ~~LOC~~ SOAJ: 1.7000 mg | SUBCUTANEOUS | 1 refills | Status: DC

## 2023-08-15 NOTE — Progress Notes (Deleted)
 Office: [phone]  /  Fax: [phone]  Weight Summary And Biometrics  Vitals Temp: 98.2 F (36.8 C) BP: 106/70 Pulse Rate: 66 SpO2: 98 %   Anthropometric Measurements Height: 5\' 5"  (1.651 m) Weight: 155 lb (70.3 kg) BMI (Calculated): 25.79 Weight at Last Visit: 155 lb Weight Lost Since Last Visit: 0 lb Weight Gained Since Last Visit: 0 lb Starting Weight: 202 lb Total Weight Loss (lbs): 47 lb (21.3 kg) Peak Weight: 220 lb   Body Composition  Body Fat %: 28.1 % Fat Mass (lbs): 43.8 lbs Muscle Mass (lbs): 106.2 lbs Total Body Water (lbs): 64.4 lbs Visceral Fat Rating : 1    No data recorded Today's Visit #: 25  Starting Date: 08/04/22   Subjective   Chief Complaint: Obesity  Interval History ***  Challenges affecting patient progress: {EMOBESITYBARRIERS:28841::"none"}.    Pharmacotherapy for weight management: She is currently taking {EMPharmaco:28845}.   Assessment and Plan   Treatment Plan For Obesity:  Recommended Dietary Goals  [name] is currently in the action stage of change. As such, her goal is to continue weight management plan. She has agreed to: {EMWTLOSSPLAN:29297::"continue current plan"}  Behavioral Health and Counseling  We discussed the following behavioral modification strategies today: {EMWMwtlossstrategies:28914::"continue to work on maintaining a reduced calorie state, getting the recommended amount of protein, incorporating whole foods, making healthy choices, staying well hydrated and practicing mindfulness when eating."}.  Additional education and resources provided today: {EMadditionalresources:29169::"None"}  Recommended Physical Activity Goals  [name] has been advised to work up to 150 minutes of moderate intensity aerobic activity a week and strengthening exercises 2-3 times per week for cardiovascular health, weight loss maintenance and preservation of muscle mass.   She has agreed to :  {EMEXERCISE:28847::"Think  about enjoyable ways to increase daily physical activity and overcoming barriers to exercise","Increase physical activity in their day and reduce sedentary time (increase NEAT)."}  Pharmacotherapy  We discussed various medication options to help [name] with her weight loss efforts and we both agreed to : {EMagreedrx:29170}  Associated Conditions Impacted by Obesity Treatment  Class 1 obesity with serious comorbidity and body mass index (BMI) of 33.0 to 33.9 in adult, unspecified obesity type Assessment & Plan: Considering her peak weight she has lost 68 pounds or 31% of total body weight.  Her BMI is now at 25, with a body fat percentage of 28% and visceral fat rating of 7% which suggest significant improvements in body composition beyond weight loss.  Her muscle loss rate is less than 20%.  She has good adherence to reduced calorie nutrition plan, has increase her protein and is also physically active.  She is currently in the weight maintenance phase of her journey.  She will continue with GLP-1 for weight loss maintenance.    Orders: -     Wegovy ; Inject 1.7 mg into the skin once a week.  Dispense: 3 mL; Refill: 1  Abnormal food appetite Assessment & Plan: Improved on Wegovy  1.7 mg once a week.  She has increased orexigenic signaling, impaired satiety and inhibitory control. This is secondary to an abnormal energy regulation system and pathological neurohormonal pathways characteristic of excess adiposity.  In addition to nutritional and behavioral strategies she benefits from ongoing pharmacotherapy.    Orders: -     Wegovy ; Inject 1.7 mg into the skin once a week.  Dispense: 3 mL; Refill: 1  Generalized obesity with starting BMI of 34  Situational anxiety Assessment & Plan: Improved significantly on Lexapro .  No side  effects reported continue Lexapro  10 mg once a day  Orders: -     Escitalopram  Oxalate; Take 1 tablet (10 mg total) by mouth at bedtime.  Dispense: 90 tablet; Refill:  0  Essential hypertension Assessment & Plan: Blood pressure is well-controlled.  Currently on lisinopril  without any adverse effects continue current regimen.  She will monitor for orthostasis and maintain adequate hydration since her blood pressure is low normal.  She benefits from ACE inhibitor due to history of hypertrophic cardiomyopathy.      ***  Objective   Physical Exam:  Blood pressure 106/70, pulse 66, temperature 98.2 F (36.8 C), height 5\' 5"  (1.651 m), weight 155 lb (70.3 kg), SpO2 98%. Body mass index is 25.79 kg/m.  General: She is overweight, cooperative, alert, well developed, and in no acute distress. PSYCH: Has normal mood, affect and thought process.   HEENT: EOMI, sclerae are anicteric. Lungs: Normal breathing effort, no conversational dyspnea. Extremities: No edema.  Neurologic: No gross sensory or motor deficits. No tremors or fasciculations noted.    Diagnostic Data Reviewed:  BMET    Component Value Date/Time   NA 141 03/06/2023 1127   K 4.3 03/06/2023 1127   CL 100 03/06/2023 1127   CO2 24 03/06/2023 1127   GLUCOSE 73 03/06/2023 1127   GLUCOSE 105 (H) 10/30/2017 2320   BUN 20 03/06/2023 1127   CREATININE 1.08 (H) 03/06/2023 1127   CREATININE 1.01 01/22/2015 1033   CALCIUM  9.7 03/06/2023 1127   GFRNONAA 79 05/13/2019 1340   GFRAA 91 05/13/2019 1340   Lab Results  Component Value Date   HGBA1C 5.8 (H) 03/06/2023   HGBA1C 5.9 (H) 04/19/2014   Lab Results  Component Value Date   INSULIN  10.4 03/06/2023   INSULIN  22.8 08/03/2021   Lab Results  Component Value Date   TSH 0.878 08/03/2021   CBC    Component Value Date/Time   WBC 9.8 03/20/2022 0756   WBC 7.2 10/30/2017 2320   RBC 5.57 (H) 03/20/2022 0756   RBC 5.31 (H) 10/30/2017 2320   HGB 14.2 03/20/2022 0756   HCT 45.4 03/20/2022 0756   PLT 265 03/20/2022 0756   MCV 82 03/20/2022 0756   MCH 25.5 (L) 03/20/2022 0756   MCH 26.0 10/30/2017 2320   MCHC 31.3 (L) 03/20/2022 0756    MCHC 31.4 10/30/2017 2320   RDW 14.5 03/20/2022 0756   Iron Studies No results found for: "IRON", "TIBC", "FERRITIN", "IRONPCTSAT" Lipid Panel     Component Value Date/Time   CHOL 203 (H) 03/06/2023 1127   TRIG 89 03/06/2023 1127   HDL 49 03/06/2023 1127   CHOLHDL 6.9 (H) 11/02/2020 1552   CHOLHDL 4.8 04/19/2014 0725   VLDL 19 04/19/2014 0725   LDLCALC 138 (H) 03/06/2023 1127   Hepatic Function Panel     Component Value Date/Time   PROT 7.8 03/06/2023 1127   ALBUMIN 4.5 03/06/2023 1127   AST 25 03/06/2023 1127   ALT 21 03/06/2023 1127   ALKPHOS 96 03/06/2023 1127   BILITOT 0.7 03/06/2023 1127   BILIDIR 0.0 10/04/2012 0918      Component Value Date/Time   TSH 0.878 08/03/2021 0847   Nutritional Lab Results  Component Value Date   VD25OH 57.7 03/06/2023   VD25OH 74.7 03/20/2022   VD25OH 57.4 08/03/2021    Medications: Outpatient Encounter Medications as of 08/15/2023  Medication Sig   aspirin  81 MG EC tablet Take 81 mg by mouth daily.     Cholecalciferol (VITAMIN D )  50 MCG (2000 UT) tablet Take 2,000 Units by mouth daily.   furosemide  (LASIX ) 40 MG tablet 1 tablet by mouth once daily and may take an extra tablet as needed for swelling   lisinopril  (ZESTRIL ) 10 MG tablet TAKE 1 TABLET BY MOUTH EVERYDAY AT BEDTIME   Omega-3 Fatty Acids (FISH OIL PO) Take 1 tablet by mouth daily.   prednisoLONE acetate (PRED FORTE) 1 % ophthalmic suspension PLEASE SEE ATTACHED FOR DETAILED DIRECTIONS   [DISCONTINUED] escitalopram  (LEXAPRO ) 10 MG tablet Take 1 tablet (10 mg total) by mouth at bedtime.   [DISCONTINUED] Semaglutide -Weight Management (WEGOVY ) 1.7 MG/0.75ML SOAJ Inject 1.7 mg into the skin once a week.   escitalopram  (LEXAPRO ) 10 MG tablet Take 1 tablet (10 mg total) by mouth at bedtime.   rosuvastatin  (CRESTOR ) 10 MG tablet Take 1 tablet (10 mg total) by mouth daily.   Semaglutide -Weight Management (WEGOVY ) 1.7 MG/0.75ML SOAJ Inject 1.7 mg into the skin once a week.    No facility-administered encounter medications on file as of 08/15/2023.     Follow-Up   Return in about 5 weeks (around 09/19/2023) for For Weight Mangement with Dr. Allie Area.Aaron Aas She was informed of the importance of frequent follow up visits to maximize her success with intensive lifestyle modifications for her multiple health conditions.  Attestation Statement   Reviewed by clinician on day of visit: allergies, medications, problem list, medical history, surgical history, family history, social history, and previous encounter notes.     Ladd Picker, MD

## 2023-08-15 NOTE — Assessment & Plan Note (Signed)
 Improved significantly on Lexapro .  No side effects reported continue Lexapro  10 mg once a day

## 2023-08-15 NOTE — Assessment & Plan Note (Signed)
 Blood pressure is well-controlled.  Currently on lisinopril  without any adverse effects continue current regimen.  She will monitor for orthostasis and maintain adequate hydration since her blood pressure is low normal.  She benefits from ACE inhibitor due to history of hypertrophic cardiomyopathy.

## 2023-08-15 NOTE — Assessment & Plan Note (Signed)
 Improved on Wegovy 1.7 mg once a week.  She has increased orexigenic signaling, impaired satiety and inhibitory control. This is secondary to an abnormal energy regulation system and pathological neurohormonal pathways characteristic of excess adiposity.  In addition to nutritional and behavioral strategies she benefits from ongoing pharmacotherapy.

## 2023-08-15 NOTE — Addendum Note (Signed)
 Addended by: Filbert Huff on: 08/15/2023 08:03 AM   Modules accepted: Level of Service

## 2023-08-15 NOTE — Progress Notes (Signed)
 Office: [phone]  /  Fax: [phone]  Weight Summary And Biometrics  Vitals Temp: 98.2 F (36.8 C) BP: 106/70 Pulse Rate: 66 SpO2: 98 %   Anthropometric Measurements Height: 5\' 5"  (1.651 m) Weight: 155 lb (70.3 kg) BMI (Calculated): 25.79 Weight at Last Visit: 155 lb Weight Lost Since Last Visit: 0 lb Weight Gained Since Last Visit: 0 lb Starting Weight: 202 lb Total Weight Loss (lbs): 47 lb (21.3 kg) Peak Weight: 220 lb   Body Composition  Body Fat %: 28.1 % Fat Mass (lbs): 43.8 lbs Muscle Mass (lbs): 106.2 lbs Total Body Water (lbs): 64.4 lbs Visceral Fat Rating : 1    No data recorded Today's Visit #: 25  Starting Date: 08/04/22   Subjective   Chief Complaint: Obesity  Interval History Discussed the use of AI scribe software for clinical note transcription with the patient, who gave verbal consent to proceed.  History of Present Illness   She is a 63 year old female presenting for medical weight management with associated conditions of prediabetes, hypertension, and anxiety.  She is in the maintenance phase of her weight loss journey, adhering to a 1200 calorie meal plan 70-80% of the time, tracking her calories, and consuming more whole foods. She maintains adequate hydration but occasionally skips meals. She exercises five to six days a week for 20-30 minutes, focusing on strengthening and cardio. She reports adequate sleep and decreased stress levels.  She takes lisinopril  10 mg for hypertension, with blood pressure around 106/70 mmHg. No lightheadedness or dizziness is experienced. A previous concern about low blood pressure was addressed by her cardiologist, confirming it is acceptable as long as she is asymptomatic.  She is on Lexapro  10 mg for anxiety, taken every night. It has helped reduce her stress and improve her sleep, with sleep from about ten to six or six-thirty. She feels her mind is not racing as much and describes a sense of  'quietness.'  She uses Wegovy  for weight management with no side effects. She is mindful of her carbohydrate intake, aiming for about 130 grams per day, and is attentive to the protein content in her diet. She uses a new water bottle to ensure adequate hydration throughout the day, adjusting her intake based on her schedule.       Challenges affecting patient progress: none.    Pharmacotherapy for weight management: She is currently taking Wegovy  with adequate clinical response  and without side effects..   Assessment and Plan   Treatment Plan For Obesity:  Recommended Dietary Goals  [name] is currently in the action stage of change. As such, her goal is to continue weight management plan. She has agreed to: keep a food journal with a target of  1300 calories per day and 90-120 grams of protein per day or 30-40 grams per meal.  Behavioral Health and Counseling  We discussed the following behavioral modification strategies today: continue to work on maintaining a reduced calorie state, getting the recommended amount of protein, incorporating whole foods, making healthy choices, staying well hydrated and practicing mindfulness when eating..  Additional education and resources provided today: Personalized instruction on the use of [employer]UK[employer] app to quickly assess nutritional value of prepackaged foods.  Recommended Physical Activity Goals  [name] has been advised to work up to 150 minutes of moderate intensity aerobic activity a week and strengthening exercises 2-3 times per week for cardiovascular health, weight loss maintenance and preservation of muscle mass.   She has agreed to :  Continue current level of physical activity   Pharmacotherapy  We discussed various medication options to help [name] with her weight loss efforts and we both agreed to : adequate clinical response to anti-obesity medication, continue current regimen  Associated Conditions Impacted by Obesity Treatment  Class  1 obesity with serious comorbidity and body mass index (BMI) of 33.0 to 33.9 in adult, unspecified obesity type Assessment & Plan: Considering her peak weight she has lost 68 pounds or 31% of total body weight.  Her BMI is now at 25, with a body fat percentage of 28% and visceral fat rating of 7% which suggest significant improvements in body composition beyond weight loss.  Her muscle loss rate is less than 20%.  She has good adherence to reduced calorie nutrition plan, has increase her protein and is also physically active.  She is currently in the weight maintenance phase of her journey.  She will continue with GLP-1 for weight loss maintenance.    Orders: -     Wegovy ; Inject 1.7 mg into the skin once a week.  Dispense: 3 mL; Refill: 1  Abnormal food appetite Assessment & Plan: Improved on Wegovy  1.7 mg once a week.  She has increased orexigenic signaling, impaired satiety and inhibitory control. This is secondary to an abnormal energy regulation system and pathological neurohormonal pathways characteristic of excess adiposity.  In addition to nutritional and behavioral strategies she benefits from ongoing pharmacotherapy.    Orders: -     Wegovy ; Inject 1.7 mg into the skin once a week.  Dispense: 3 mL; Refill: 1  Situational anxiety Assessment & Plan: Improved significantly on Lexapro .  No side effects reported continue Lexapro  10 mg once a day  Orders: -     Escitalopram  Oxalate; Take 1 tablet (10 mg total) by mouth at bedtime.  Dispense: 90 tablet; Refill: 0  Essential hypertension Assessment & Plan: Blood pressure is well-controlled.  Currently on lisinopril  without any adverse effects continue current regimen.  She will monitor for orthostasis and maintain adequate hydration since her blood pressure is low normal.  She benefits from ACE inhibitor due to history of hypertrophic cardiomyopathy.        Objective   Physical Exam:  Blood pressure 106/70, pulse 66, temperature  98.2 F (36.8 C), height 5\' 5"  (1.651 m), weight 155 lb (70.3 kg), SpO2 98%. Body mass index is 25.79 kg/m.  General: She is overweight, cooperative, alert, well developed, and in no acute distress. PSYCH: Has normal mood, affect and thought process.   HEENT: EOMI, sclerae are anicteric. Lungs: Normal breathing effort, no conversational dyspnea. Extremities: No edema.  Neurologic: No gross sensory or motor deficits. No tremors or fasciculations noted.    Diagnostic Data Reviewed:  BMET    Component Value Date/Time   NA 141 03/06/2023 1127   K 4.3 03/06/2023 1127   CL 100 03/06/2023 1127   CO2 24 03/06/2023 1127   GLUCOSE 73 03/06/2023 1127   GLUCOSE 105 (H) 10/30/2017 2320   BUN 20 03/06/2023 1127   CREATININE 1.08 (H) 03/06/2023 1127   CREATININE 1.01 01/22/2015 1033   CALCIUM  9.7 03/06/2023 1127   GFRNONAA 79 05/13/2019 1340   GFRAA 91 05/13/2019 1340   Lab Results  Component Value Date   HGBA1C 5.8 (H) 03/06/2023   HGBA1C 5.9 (H) 04/19/2014   Lab Results  Component Value Date   INSULIN  10.4 03/06/2023   INSULIN  22.8 08/03/2021   Lab Results  Component Value Date   TSH 0.878  08/03/2021   CBC    Component Value Date/Time   WBC 9.8 03/20/2022 0756   WBC 7.2 10/30/2017 2320   RBC 5.57 (H) 03/20/2022 0756   RBC 5.31 (H) 10/30/2017 2320   HGB 14.2 03/20/2022 0756   HCT 45.4 03/20/2022 0756   PLT 265 03/20/2022 0756   MCV 82 03/20/2022 0756   MCH 25.5 (L) 03/20/2022 0756   MCH 26.0 10/30/2017 2320   MCHC 31.3 (L) 03/20/2022 0756   MCHC 31.4 10/30/2017 2320   RDW 14.5 03/20/2022 0756   Iron Studies No results found for: "IRON", "TIBC", "FERRITIN", "IRONPCTSAT" Lipid Panel     Component Value Date/Time   CHOL 203 (H) 03/06/2023 1127   TRIG 89 03/06/2023 1127   HDL 49 03/06/2023 1127   CHOLHDL 6.9 (H) 11/02/2020 1552   CHOLHDL 4.8 04/19/2014 0725   VLDL 19 04/19/2014 0725   LDLCALC 138 (H) 03/06/2023 1127   Hepatic Function Panel     Component  Value Date/Time   PROT 7.8 03/06/2023 1127   ALBUMIN 4.5 03/06/2023 1127   AST 25 03/06/2023 1127   ALT 21 03/06/2023 1127   ALKPHOS 96 03/06/2023 1127   BILITOT 0.7 03/06/2023 1127   BILIDIR 0.0 10/04/2012 0918      Component Value Date/Time   TSH 0.878 08/03/2021 0847   Nutritional Lab Results  Component Value Date   VD25OH 57.7 03/06/2023   VD25OH 74.7 03/20/2022   VD25OH 57.4 08/03/2021    Medications: Outpatient Encounter Medications as of 08/15/2023  Medication Sig   aspirin  81 MG EC tablet Take 81 mg by mouth daily.     Cholecalciferol (VITAMIN D ) 50 MCG (2000 UT) tablet Take 2,000 Units by mouth daily.   furosemide  (LASIX ) 40 MG tablet 1 tablet by mouth once daily and may take an extra tablet as needed for swelling   lisinopril  (ZESTRIL ) 10 MG tablet TAKE 1 TABLET BY MOUTH EVERYDAY AT BEDTIME   Omega-3 Fatty Acids (FISH OIL PO) Take 1 tablet by mouth daily.   prednisoLONE acetate (PRED FORTE) 1 % ophthalmic suspension PLEASE SEE ATTACHED FOR DETAILED DIRECTIONS   [DISCONTINUED] escitalopram  (LEXAPRO ) 10 MG tablet Take 1 tablet (10 mg total) by mouth at bedtime.   [DISCONTINUED] Semaglutide -Weight Management (WEGOVY ) 1.7 MG/0.75ML SOAJ Inject 1.7 mg into the skin once a week.   escitalopram  (LEXAPRO ) 10 MG tablet Take 1 tablet (10 mg total) by mouth at bedtime.   rosuvastatin  (CRESTOR ) 10 MG tablet Take 1 tablet (10 mg total) by mouth daily.   Semaglutide -Weight Management (WEGOVY ) 1.7 MG/0.75ML SOAJ Inject 1.7 mg into the skin once a week.   No facility-administered encounter medications on file as of 08/15/2023.     Follow-Up   Return in about 5 weeks (around 09/19/2023) for For Weight Mangement with Dr. Allie Area.Aaron Aas She was informed of the importance of frequent follow up visits to maximize her success with intensive lifestyle modifications for her multiple health conditions.  Attestation Statement   Reviewed by clinician on day of visit: allergies, medications,  problem list, medical history, surgical history, family history, social history, and previous encounter notes.     Ladd Picker, MD

## 2023-08-15 NOTE — Assessment & Plan Note (Signed)
 Considering her peak weight she has lost 68 pounds or 31% of total body weight.  Her BMI is now at 25, with a body fat percentage of 28% and visceral fat rating of 7% which suggest significant improvements in body composition beyond weight loss.  Her muscle loss rate is less than 20%.  She has good adherence to reduced calorie nutrition plan, has increase her protein and is also physically active.  She is currently in the weight maintenance phase of her journey.  She will continue with GLP-1 for weight loss maintenance.

## 2023-09-18 ENCOUNTER — Ambulatory Visit (INDEPENDENT_AMBULATORY_CARE_PROVIDER_SITE_OTHER): Admitting: Internal Medicine

## 2023-10-06 ENCOUNTER — Other Ambulatory Visit: Payer: Self-pay | Admitting: Family Medicine

## 2023-10-23 ENCOUNTER — Encounter (INDEPENDENT_AMBULATORY_CARE_PROVIDER_SITE_OTHER): Payer: Self-pay | Admitting: Internal Medicine

## 2023-10-23 ENCOUNTER — Ambulatory Visit (INDEPENDENT_AMBULATORY_CARE_PROVIDER_SITE_OTHER): Admitting: Internal Medicine

## 2023-10-23 VITALS — BP 130/85 | HR 68 | Temp 98.4°F | Ht 65.0 in | Wt 158.0 lb

## 2023-10-23 DIAGNOSIS — F418 Other specified anxiety disorders: Secondary | ICD-10-CM | POA: Diagnosis not present

## 2023-10-23 DIAGNOSIS — R7303 Prediabetes: Secondary | ICD-10-CM | POA: Diagnosis not present

## 2023-10-23 DIAGNOSIS — E66811 Obesity, class 1: Secondary | ICD-10-CM

## 2023-10-23 DIAGNOSIS — R638 Other symptoms and signs concerning food and fluid intake: Secondary | ICD-10-CM | POA: Diagnosis not present

## 2023-10-23 DIAGNOSIS — I10 Essential (primary) hypertension: Secondary | ICD-10-CM

## 2023-10-23 DIAGNOSIS — Z6833 Body mass index (BMI) 33.0-33.9, adult: Secondary | ICD-10-CM

## 2023-10-23 DIAGNOSIS — E7800 Pure hypercholesterolemia, unspecified: Secondary | ICD-10-CM

## 2023-10-23 MED ORDER — WEGOVY 1.7 MG/0.75ML ~~LOC~~ SOAJ: 1.7000 mg | SUBCUTANEOUS | 1 refills | Status: DC

## 2023-10-23 MED ORDER — ESCITALOPRAM OXALATE 10 MG PO TABS
10.0000 mg | ORAL_TABLET | Freq: Every day | ORAL | 0 refills | Status: DC
Start: 2023-10-23 — End: 2024-01-24

## 2023-10-23 NOTE — Assessment & Plan Note (Signed)
 Blood pressure is well-controlled.  Currently on lisinopril  without any adverse effects continue current regimen.  She will monitor for orthostasis and maintain adequate hydration since her blood pressure is low normal.  She benefits from ACE inhibitor due to history of hypertrophic cardiomyopathy.

## 2023-10-23 NOTE — Assessment & Plan Note (Signed)
 Improved on Wegovy  1.7 mg once a week.  She has increased orexigenic signaling, impaired satiety and inhibitory control. This is secondary to an abnormal energy regulation system and pathological neurohormonal pathways characteristic of excess adiposity.  In addition to nutritional and behavioral strategies she benefits from ongoing pharmacotherapy.  Continue Wegovy  at current dose

## 2023-10-23 NOTE — Progress Notes (Signed)
 Office: [phone]  /  Fax: [phone]  Weight Summary and Body Composition Analysis (BIA)  Vitals Temp: 98.4 F (36.9 C) BP: 130/85 Pulse Rate: 68 SpO2: 100 %   Anthropometric Measurements Height: 5' 5 (1.651 m) Weight: 158 lb (71.7 kg) BMI (Calculated): 26.29 Weight at Last Visit: 155 lb Weight Lost Since Last Visit: 0 lb Weight Gained Since Last Visit: 3 lb Starting Weight: 202 lb Total Weight Loss (lbs): 44 lb (20 kg) Peak Weight: 220 lb   Body Composition  Body Fat %: 31.7 % Fat Mass (lbs): 50.4 lbs Muscle Mass (lbs): 103 lbs Total Body Water (lbs): 64.4 lbs Visceral Fat Rating : 8    RMR: 1699  Today's Visit #: 26  Starting Date: 08/04/22   Subjective   Chief Complaint: Obesity  Interval History Discussed the use of AI scribe software for clinical note transcription with the patient, who gave verbal consent to proceed.  Discussed the use of AI scribe software for clinical note transcription with the patient, who gave verbal consent to proceed.  History of Present Illness   [name] is a 64 year old female with hypertension and prediabetes who presents for medical weight management.  She is currently on Wegovy  1.7 mg once a week for weight management. She had previously lost approximately 68 pounds, which was 31% of her total body weight, but has gained 3 pounds since her last visit. She follows a 1200 calorie target with fair adherence, tracking calories, eating more whole foods, getting the recommended amount of protein, and maintaining adequate hydration. However, she acknowledges skipping meals. She exercises four days a week for about 25 to 30 minutes, focusing on strength and cardio exercises.  There has been a recent increase in sugar intake, which she attributes to her weight gain. She mentions consuming grilled corn frequently and enjoying pineapple popsicles, which she believes may have contributed to her increased sugar  intake.  She reports sleeping well, attributing this improvement to her current medication regimen, which includes Lexapro  10 mg for anxiety. She notes experiencing a quietness of mind and fewer racing thoughts, allowing her to sleep through the night most of the time.  No nausea or constipation with her current medication regimen.        Challenges affecting patient progress: none.    Pharmacotherapy for weight management: She is currently taking Wegovy  with adequate clinical response  and without side effects..   Assessment and Plan   Treatment Plan For Obesity:  Recommended Dietary Goals  [name] is currently in the action stage of change. As such, her goal is to continue weight management plan. She has agreed to: continue current plan  Behavioral Health and Counseling  We discussed the following behavioral modification strategies today: continue to work on maintaining a reduced calorie state, getting the recommended amount of protein, incorporating whole foods, making healthy choices, staying well hydrated and practicing mindfulness when eating..  Additional education and resources provided today: None  Recommended Physical Activity Goals  [name] has been advised to work up to 150 minutes of moderate intensity aerobic activity a week and strengthening exercises 2-3 times per week for cardiovascular health, weight loss maintenance and preservation of muscle mass.   She has agreed to :  Increase the intensity, frequency or duration of strengthening exercises , Increase the intensity, frequency or duration of aerobic exercises  , and she is building a hme gym in her garage  Medical Interventions and Pharmacotherapy  We discussed various medication  options to help [name] with her weight loss efforts and we both agreed to : Adequate clinical response to anti-obesity medication, continue current regimen  Associated Conditions Impacted by Obesity Treatment  Assessment &  Plan Abnormal food appetite Improved on Wegovy  1.7 mg once a week.  She has increased orexigenic signaling, impaired satiety and inhibitory control. This is secondary to an abnormal energy regulation system and pathological neurohormonal pathways characteristic of excess adiposity.  In addition to nutritional and behavioral strategies she benefits from ongoing pharmacotherapy.  Continue Wegovy  at current dose  Class 1 obesity with serious comorbidity and body mass index (BMI) of 33.0 to 33.9 in adult, unspecified obesity type Considering her peak weight she has lost 68 pounds or 31% of total body weight.  Her BMI is now at 26, with a body fat percentage of 28% and visceral fat rating of 7% which suggest significant improvements in body composition beyond weight loss.  Her muscle loss rate is less than 20%.  She has good adherence to reduced calorie nutrition plan, with a recent labs due to summer eating.  She will continue Wegovy  at 1.7 mg for weight loss maintenance   Situational anxiety Improved significantly on Lexapro .  No side effects reported continue Lexapro  10 mg once a day, she reports better sleep and has been feeling well.  Continue medication at current dose Prediabetes Her hemoglobin A1c is down to 5.8 highest recorded was 6.4 back in 2023 this represents a significant improvement in glycemic control.  She is currently on Wegovy  for pharmacoprophylaxis and weight management and will continue medication at current dose.  Patient also aware of the importance of maintaining a diet with a low glycemic load.  We will check hemoglobin A1c and fasting blood sugar today. Essential hypertension Blood pressure is well-controlled.  Currently on lisinopril  without any adverse effects continue current regimen.  She will monitor for orthostasis and maintain adequate hydration since her blood pressure is low normal.  She benefits from ACE inhibitor due to history of hypertrophic cardiomyopathy. Pure  hypercholesterolemia She had an elevated LDL in the past. The 10-year ASCVD risk score (Arnett DK, et al., 2019) is: 8.1% and may benefit from statin therapy for cardiovascular risk reduction.  Her blood pressure is well-controlled.  We will check fasting lipid profile and assess cardiovascular risk      Objective   Physical Exam:  Blood pressure 130/85, pulse 68, temperature 98.4 F (36.9 C), height 5' 5 (1.651 m), weight 158 lb (71.7 kg), SpO2 100%. Body mass index is 26.29 kg/m.  General: She is overweight, cooperative, alert, well developed, and in no acute distress. PSYCH: Has normal mood, affect and thought process.   HEENT: EOMI, sclerae are anicteric. Lungs: Normal breathing effort, no conversational dyspnea. Extremities: No edema.  Neurologic: No gross sensory or motor deficits. No tremors or fasciculations noted.    Diagnostic Data Reviewed:  BMET    Component Value Date/Time   NA 141 03/06/2023 1127   K 4.3 03/06/2023 1127   CL 100 03/06/2023 1127   CO2 24 03/06/2023 1127   GLUCOSE 73 03/06/2023 1127   GLUCOSE 105 (H) 10/30/2017 2320   BUN 20 03/06/2023 1127   CREATININE 1.08 (H) 03/06/2023 1127   CREATININE 1.01 01/22/2015 1033   CALCIUM  9.7 03/06/2023 1127   GFRNONAA 79 05/13/2019 1340   GFRAA 91 05/13/2019 1340   Lab Results  Component Value Date   HGBA1C 5.8 (H) 03/06/2023   HGBA1C 5.9 (H) 04/19/2014   Lab  Results  Component Value Date   INSULIN  10.4 03/06/2023   INSULIN  22.8 08/03/2021   Lab Results  Component Value Date   TSH 0.878 08/03/2021   CBC    Component Value Date/Time   WBC 9.8 03/20/2022 0756   WBC 7.2 10/30/2017 2320   RBC 5.57 (H) 03/20/2022 0756   RBC 5.31 (H) 10/30/2017 2320   HGB 14.2 03/20/2022 0756   HCT 45.4 03/20/2022 0756   PLT 265 03/20/2022 0756   MCV 82 03/20/2022 0756   MCH 25.5 (L) 03/20/2022 0756   MCH 26.0 10/30/2017 2320   MCHC 31.3 (L) 03/20/2022 0756   MCHC 31.4 10/30/2017 2320   RDW 14.5 03/20/2022  0756   Iron Studies No results found for: IRON, TIBC, FERRITIN, IRONPCTSAT Lipid Panel     Component Value Date/Time   CHOL 203 (H) 03/06/2023 1127   TRIG 89 03/06/2023 1127   HDL 49 03/06/2023 1127   CHOLHDL 6.9 (H) 11/02/2020 1552   CHOLHDL 4.8 04/19/2014 0725   VLDL 19 04/19/2014 0725   LDLCALC 138 (H) 03/06/2023 1127   Hepatic Function Panel     Component Value Date/Time   PROT 7.8 03/06/2023 1127   ALBUMIN 4.5 03/06/2023 1127   AST 25 03/06/2023 1127   ALT 21 03/06/2023 1127   ALKPHOS 96 03/06/2023 1127   BILITOT 0.7 03/06/2023 1127   BILIDIR 0.0 10/04/2012 0918      Component Value Date/Time   TSH 0.878 08/03/2021 0847   Nutritional Lab Results  Component Value Date   VD25OH 57.7 03/06/2023   VD25OH 74.7 03/20/2022   VD25OH 57.4 08/03/2021    Medications: Outpatient Encounter Medications as of 10/23/2023  Medication Sig   aspirin  81 MG EC tablet Take 81 mg by mouth daily.     Cholecalciferol (VITAMIN D ) 50 MCG (2000 UT) tablet Take 2,000 Units by mouth daily.   furosemide  (LASIX ) 40 MG tablet 1 tablet by mouth once daily and may take an extra tablet as needed for swelling   lisinopril  (ZESTRIL ) 10 MG tablet TAKE 1 TABLET BY MOUTH EVERYDAY AT BEDTIME   Omega-3 Fatty Acids (FISH OIL PO) Take 1 tablet by mouth daily.   prednisoLONE acetate (PRED FORTE) 1 % ophthalmic suspension PLEASE SEE ATTACHED FOR DETAILED DIRECTIONS   rosuvastatin  (CRESTOR ) 10 MG tablet Take 1 tablet (10 mg total) by mouth daily.   [DISCONTINUED] escitalopram  (LEXAPRO ) 10 MG tablet Take 1 tablet (10 mg total) by mouth at bedtime.   [DISCONTINUED] Semaglutide -Weight Management (WEGOVY ) 1.7 MG/0.75ML SOAJ Inject 1.7 mg into the skin once a week.   escitalopram  (LEXAPRO ) 10 MG tablet Take 1 tablet (10 mg total) by mouth at bedtime.   Semaglutide -Weight Management (WEGOVY ) 1.7 MG/0.75ML SOAJ Inject 1.7 mg into the skin once a week.   No facility-administered encounter medications on file  as of 10/23/2023.     Follow-Up   Return in about 4 weeks (around 11/20/2023) for For Weight Mangement with Dr. Francyne.SABRA She was informed of the importance of frequent follow up visits to maximize her success with intensive lifestyle modifications for her multiple health conditions.  Attestation Statement   Reviewed by clinician on day of visit: allergies, medications, problem list, medical history, surgical history, family history, social history, and previous encounter notes.     Lucas Francyne, MD

## 2023-10-23 NOTE — Assessment & Plan Note (Signed)
 Improved significantly on Lexapro .  No side effects reported continue Lexapro  10 mg once a day, she reports better sleep and has been feeling well.  Continue medication at current dose

## 2023-10-23 NOTE — Assessment & Plan Note (Signed)
 Considering her peak weight she has lost 68 pounds or 31% of total body weight.  Her BMI is now at 26, with a body fat percentage of 28% and visceral fat rating of 7% which suggest significant improvements in body composition beyond weight loss.  Her muscle loss rate is less than 20%.  She has good adherence to reduced calorie nutrition plan, with a recent labs due to summer eating.  She will continue Wegovy  at 1.7 mg for weight loss maintenance

## 2023-10-23 NOTE — Assessment & Plan Note (Signed)
 Her hemoglobin A1c is down to 5.8 highest recorded was 6.4 back in 2023 this represents a significant improvement in glycemic control.  She is currently on Wegovy  for pharmacoprophylaxis and weight management and will continue medication at current dose.  Patient also aware of the importance of maintaining a diet with a low glycemic load.  We will check hemoglobin A1c and fasting blood sugar today.

## 2023-10-23 NOTE — Assessment & Plan Note (Signed)
 She had an elevated LDL in the past. The 10-year ASCVD risk score (Arnett DK, et al., 2019) is: 8.1% and may benefit from statin therapy for cardiovascular risk reduction.  Her blood pressure is well-controlled.  We will check fasting lipid profile and assess cardiovascular risk

## 2023-10-24 ENCOUNTER — Ambulatory Visit (INDEPENDENT_AMBULATORY_CARE_PROVIDER_SITE_OTHER): Payer: Self-pay | Admitting: Internal Medicine

## 2023-10-24 ENCOUNTER — Other Ambulatory Visit (INDEPENDENT_AMBULATORY_CARE_PROVIDER_SITE_OTHER): Payer: Self-pay | Admitting: Internal Medicine

## 2023-10-24 DIAGNOSIS — E7800 Pure hypercholesterolemia, unspecified: Secondary | ICD-10-CM

## 2023-10-24 LAB — LIPID PANEL WITH LDL/HDL RATIO
Cholesterol, Total: 193 mg/dL (ref 100–199)
HDL: 50 mg/dL (ref >39)
LDL Chol Calc (NIH): 129 mg/dL — ABNORMAL HIGH (ref 0–99)
LDL/HDL Ratio: 2.6 ratio (ref 0.0–3.2)
Triglycerides: 76 mg/dL (ref 0–149)
VLDL Cholesterol Cal: 14 mg/dL (ref 5–40)

## 2023-10-24 LAB — TSH: TSH: 0.931 u[IU]/mL (ref 0.450–4.500)

## 2023-10-24 LAB — HEMOGLOBIN A1C
Est. average glucose Bld gHb Est-mCnc: 114 mg/dL
Hgb A1c MFr Bld: 5.6 % (ref 4.8–5.6)

## 2023-10-24 LAB — CMP14+EGFR
ALT: 27 IU/L (ref 0–32)
AST: 27 IU/L (ref 0–40)
Albumin: 4.7 g/dL (ref 3.9–4.9)
Alkaline Phosphatase: 100 IU/L (ref 44–121)
BUN/Creatinine Ratio: 16 (ref 12–28)
BUN: 17 mg/dL (ref 8–27)
Bilirubin Total: 0.7 mg/dL (ref 0.0–1.2)
CO2: 22 mmol/L (ref 20–29)
Calcium: 9.6 mg/dL (ref 8.7–10.3)
Chloride: 102 mmol/L (ref 96–106)
Creatinine, Ser: 1.08 mg/dL — ABNORMAL HIGH (ref 0.57–1.00)
Globulin, Total: 2.9 g/dL (ref 1.5–4.5)
Glucose: 83 mg/dL (ref 70–99)
Potassium: 3.9 mmol/L (ref 3.5–5.2)
Sodium: 140 mmol/L (ref 134–144)
Total Protein: 7.6 g/dL (ref 6.0–8.5)
eGFR: 58 mL/min/{1.73_m2} — ABNORMAL LOW (ref >59)

## 2023-10-24 LAB — CBC WITH DIFFERENTIAL/PLATELET
Basophils Absolute: 0.1 10*3/uL (ref 0.0–0.2)
Basos: 1 %
EOS (ABSOLUTE): 0.1 10*3/uL (ref 0.0–0.4)
Eos: 2 %
Hematocrit: 48.7 % — ABNORMAL HIGH (ref 34.0–46.6)
Hemoglobin: 15.1 g/dL (ref 11.1–15.9)
Immature Grans (Abs): 0 10*3/uL (ref 0.0–0.1)
Immature Granulocytes: 0 %
Lymphocytes Absolute: 1.5 10*3/uL (ref 0.7–3.1)
Lymphs: 25 %
MCH: 26.9 pg (ref 26.6–33.0)
MCHC: 31 g/dL — ABNORMAL LOW (ref 31.5–35.7)
MCV: 87 fL (ref 79–97)
Monocytes Absolute: 0.5 10*3/uL (ref 0.1–0.9)
Monocytes: 9 %
Neutrophils Absolute: 3.8 10*3/uL (ref 1.4–7.0)
Neutrophils: 63 %
Platelets: 213 10*3/uL (ref 150–450)
RBC: 5.62 x10E6/uL — ABNORMAL HIGH (ref 3.77–5.28)
RDW: 14.1 % (ref 11.7–15.4)
WBC: 6.1 10*3/uL (ref 3.4–10.8)

## 2023-10-24 MED ORDER — ROSUVASTATIN CALCIUM 20 MG PO TABS: 20.0000 mg | ORAL_TABLET | Freq: Every day | ORAL | 0 refills | Status: AC

## 2023-10-24 NOTE — Progress Notes (Signed)
 I reviewed most recent labs with patient.  There is improvement in hemoglobin A1c her LDL cholesterol has also improved but is still above 70.  She is currently on rosuvastatin  10 mg once a day we will increase to 20 mg daily.  Her GFR also shows decline.  She has been taking furosemide  40 mg daily for history of diastolic heart failure she is also on an ACE inhibitor no other nephrotoxic medication.  Considering degree of weight loss she may not need  high dose of the furosemide  we advised patient to start taking it every other day monitor for leg edema as well as weight gain.  I would like to repeat her kidney function test in 4 weeks.  GFR is now under 50.  The 10-year ASCVD risk score (Arnett DK, et al., 2019) is: 7.7%

## 2023-12-04 ENCOUNTER — Encounter (INDEPENDENT_AMBULATORY_CARE_PROVIDER_SITE_OTHER): Payer: Self-pay | Admitting: Internal Medicine

## 2023-12-04 ENCOUNTER — Ambulatory Visit (INDEPENDENT_AMBULATORY_CARE_PROVIDER_SITE_OTHER): Admitting: Internal Medicine

## 2023-12-04 VITALS — BP 125/81 | HR 68 | Temp 98.0°F | Ht 65.0 in | Wt 164.0 lb

## 2023-12-04 DIAGNOSIS — F418 Other specified anxiety disorders: Secondary | ICD-10-CM | POA: Diagnosis not present

## 2023-12-04 DIAGNOSIS — R944 Abnormal results of kidney function studies: Secondary | ICD-10-CM | POA: Insufficient documentation

## 2023-12-04 DIAGNOSIS — R7303 Prediabetes: Secondary | ICD-10-CM | POA: Diagnosis not present

## 2023-12-04 DIAGNOSIS — E7800 Pure hypercholesterolemia, unspecified: Secondary | ICD-10-CM

## 2023-12-04 DIAGNOSIS — Z6827 Body mass index (BMI) 27.0-27.9, adult: Secondary | ICD-10-CM | POA: Insufficient documentation

## 2023-12-04 DIAGNOSIS — I5032 Chronic diastolic (congestive) heart failure: Secondary | ICD-10-CM

## 2023-12-04 DIAGNOSIS — I110 Hypertensive heart disease with heart failure: Secondary | ICD-10-CM | POA: Diagnosis not present

## 2023-12-04 DIAGNOSIS — I10 Essential (primary) hypertension: Secondary | ICD-10-CM

## 2023-12-04 DIAGNOSIS — E669 Obesity, unspecified: Secondary | ICD-10-CM

## 2023-12-04 DIAGNOSIS — R638 Other symptoms and signs concerning food and fluid intake: Secondary | ICD-10-CM

## 2023-12-04 MED ORDER — WEGOVY 2.4 MG/0.75ML ~~LOC~~ SOAJ: 2.4000 mg | SUBCUTANEOUS | 1 refills | Status: DC

## 2023-12-04 NOTE — Assessment & Plan Note (Signed)
 Improved significantly on Lexapro .  No side effects reported continue Lexapro  10 mg once a day, she reports better sleep and has been feeling well.  Continue medication at current dose

## 2023-12-04 NOTE — Assessment & Plan Note (Signed)
 Improved most recent A1c is 5.6.  She is currently on Wegovy  for pharmacoprophylaxis and will continue medication.

## 2023-12-04 NOTE — Assessment & Plan Note (Signed)
 Blood pressure is well-controlled.  Currently on lisinopril  without any adverse effects continue current regimen.  She will monitor for orthostasis and maintain adequate hydration since her blood pressure is low normal.  She benefits from ACE inhibitor due to history of hypertrophic cardiomyopathy.  She is not on beta-blockade.  Recent renal parameters show a decrease in GFR likely secondary to loop diuretic

## 2023-12-04 NOTE — Progress Notes (Signed)
 Office: [phone]  /  Fax: [phone]  Weight Summary And Body Composition Analysis (BIA)  Vitals Temp: 98 F (36.7 C) BP: 125/81 Pulse Rate: 68 SpO2: 97 %   Anthropometric Measurements Height: 5' 5 (1.651 m) Weight: 164 lb (74.4 kg) BMI (Calculated): 27.29 Weight at Last Visit: 158 lb Weight Lost Since Last Visit: 0 lb Weight Gained Since Last Visit: 6 lb Starting Weight: 202 lb Total Weight Loss (lbs): 38 lb (17.2 kg) Peak Weight: 220 lb   Body Composition  Body Fat %: 31.4 % Fat Mass (lbs): 51.4 lbs Muscle Mass (lbs): 106.8 lbs Total Body Water (lbs): 67.2 lbs Visceral Fat Rating : 8   The 10-year ASCVD risk score (Arnett DK, et al., 2019) is: 6.9%   RMR: 1699  Today's Visit #: 69  Starting Date: 08/04/22   Subjective   Chief Complaint: Obesity  [name] is here to discuss her progress with her obesity treatment plan. She is following the Category 2 plan - 1200 kcal per day and states she is following her eating plan approximately 10-20% of the time. She states she is exercising 30 minutes 3 times per week..  Weight Progress Since Last Visit:  Since last office visit she has gained 6 pounds. She reports had a recent lapse due to vacationing or traveling She has been working on not skipping meals, increasing protein intake at every meal, drinking more water, making healthier choices, reducing portion sizes, getting back on track following recent lapse, and incorporating more whole foods   Nutritional 24 HR Recall: Intake consistent with prescribed nutritional plan  Challenges affecting patient progress: recent lapse in weight management plan due to work, travel, illness or family circumstances.   Orexigenic Control: Reports problems with appetite and hunger signals.  Reports problems with satiety and satiation.  Denies problems with eating patterns and portion control.  Denies abnormal cravings. Denies feeling deprived or restricted.    Pharmacotherapy for weight management: She is currently taking Wegovy  with adequate clinical response  and without side effects..   Assessment and Plan   Treatment Plan For Obesity:  Recommended Dietary Goals  [name] is currently in the action stage of change. As such, her goal is to continue weight management plan. She has agreed to: continue current plan  Behavioral Health and Counseling  We discussed the following behavioral modification strategies today: continue to work on maintaining a reduced calorie state, getting the recommended amount of protein, incorporating whole foods, making healthy choices, staying well hydrated and practicing mindfulness when eating. and getting back on track after recent relapse.  Additional education and resources provided today: None  Recommended Physical Activity Goals  [name] has been advised to work up to 150 minutes of moderate intensity aerobic activity a week and strengthening exercises 2-3 times per week for cardiovascular health, weight loss maintenance and preservation of muscle mass.   She has agreed to :  continue to gradually increase the amount and intensity of exercise routine  Pharmacotherapy and Medical Interventions  Adequate clinical response to anti-obesity medication, continue current regimen  Associated Conditions Impacted by Obesity Treatment  Assessment & Plan Chronic diastolic heart failure (HCC) She has grade 2 diastolic dysfunction with findings of HCM.  She is followed by cardiology.  She is not on beta-blockade.  No symptoms of dizziness or syncope.  She is currently on ACE inhibitor.  She had an echocardiogram done this year.  She is taking a loop diuretic daily this is slight decrease in GFR which  may be prerenal.  She will work with cardiology to inquire about adjusting diuretic maybe 2 every other day. Essential hypertension Blood pressure is well-controlled.  Currently on lisinopril  without any adverse effects  continue current regimen.  She will monitor for orthostasis and maintain adequate hydration since her blood pressure is low normal.  She benefits from ACE inhibitor due to history of hypertrophic cardiomyopathy.  She is not on beta-blockade.  Recent renal parameters show a decrease in GFR likely secondary to loop diuretic Situational anxiety Improved significantly on Lexapro .  No side effects reported continue Lexapro  10 mg once a day, she reports better sleep and has been feeling well.  Continue medication at current dose Generalized obesity - with starting BMI 34 Total weight loss of 38 pounds with recent 6 pound weight gain due to a lapse. Obesity management with Wegovy  1.7 mg has been ongoing since August 2023. Reports increased hunger and decreased satiety, indicating a potential decrease in medication efficacy over time. - Increase Wegovy  to 2.4 mg weekly to improve satiety and support weight management. - Work on getting back on track BMI 27.0-27.9,adult  Prediabetes Improved most recent A1c is 5.6.  She is currently on Wegovy  for pharmacoprophylaxis and will continue medication. Abnormal food appetite   She has increased orexigenic signaling, impaired satiety and inhibitory control. This is secondary to an abnormal energy regulation system and pathological neurohormonal pathways characteristic of excess adiposity.  In addition to nutritional and behavioral strategies she benefits from ongoing pharmacotherapy.  She is experiencing wearing off effect to current dose has been at this dose since 2023.  We will increase Wegovy  to 2.4 mg once a week  Pure hypercholesterolemia LDL cholesterol remains elevated at 129 mg/dL despite daily rosuvastatin  use. Family history suggests a potential genetic component to hypercholesterolemia. - Continue rosuvastatin . - Discuss with cardiologist the possibility of increasing rosuvastatin  dose or adding Zetia. - Consider coronary artery calcium  score to assess  cardiovascular risk. - Advise dietary modifications to reduce saturated fat intake, focusing on lean meats and avoiding processed meats. Decreased GFR She has had 2 GFR's under 60 she does not have diabetes but does have hypertension.  I think is likely related to the combination of loop diuretic and ACE inhibitor and less likely chronic kidney disease.  She will work with her cardiologist to see if they could reduce loop diuretic to every other day.  She also is encouraged to increase fluid intake avoid nephrotoxins.  Recommend repeating in 1 month including a urinalysis and possible renal ultrasound for further evaluation.        Objective   Physical Exam:  Blood pressure 125/81, pulse 68, temperature 98 F (36.7 C), height 5' 5 (1.651 m), weight 164 lb (74.4 kg), SpO2 97%. Body mass index is 27.29 kg/m.  General: She is overweight, cooperative, alert, well developed, and in no acute distress. PSYCH: Has normal mood, affect and thought process.   HEENT: EOMI, sclerae are anicteric. Lungs: Normal breathing effort, no conversational dyspnea. Extremities: No edema.  Neurologic: No gross sensory or motor deficits. No tremors or fasciculations noted.    Diagnostic Data Reviewed:  BMET    Component Value Date/Time   NA 140 10/23/2023 0801   K 3.9 10/23/2023 0801   CL 102 10/23/2023 0801   CO2 22 10/23/2023 0801   GLUCOSE 83 10/23/2023 0801   GLUCOSE 105 (H) 10/30/2017 2320   BUN 17 10/23/2023 0801   CREATININE 1.08 (H) 10/23/2023 0801   CREATININE 1.01 01/22/2015 1033  CALCIUM  9.6 10/23/2023 0801   GFRNONAA 79 05/13/2019 1340   GFRAA 91 05/13/2019 1340   Lab Results  Component Value Date   HGBA1C 5.6 10/23/2023   HGBA1C 5.9 (H) 04/19/2014   Lab Results  Component Value Date   INSULIN  10.4 03/06/2023   INSULIN  22.8 08/03/2021   Lab Results  Component Value Date   TSH 0.931 10/23/2023   CBC    Component Value Date/Time   WBC 6.1 10/23/2023 0801   WBC 7.2  10/30/2017 2320   RBC 5.62 (H) 10/23/2023 0801   RBC 5.31 (H) 10/30/2017 2320   HGB 15.1 10/23/2023 0801   HCT 48.7 (H) 10/23/2023 0801   PLT 213 10/23/2023 0801   MCV 87 10/23/2023 0801   MCH 26.9 10/23/2023 0801   MCH 26.0 10/30/2017 2320   MCHC 31.0 (L) 10/23/2023 0801   MCHC 31.4 10/30/2017 2320   RDW 14.1 10/23/2023 0801   Iron Studies No results found for: IRON, TIBC, FERRITIN, IRONPCTSAT Lipid Panel     Component Value Date/Time   CHOL 193 10/23/2023 0801   TRIG 76 10/23/2023 0801   HDL 50 10/23/2023 0801   CHOLHDL 6.9 (H) 11/02/2020 1552   CHOLHDL 4.8 04/19/2014 0725   VLDL 19 04/19/2014 0725   LDLCALC 129 (H) 10/23/2023 0801   Hepatic Function Panel     Component Value Date/Time   PROT 7.6 10/23/2023 0801   ALBUMIN 4.7 10/23/2023 0801   AST 27 10/23/2023 0801   ALT 27 10/23/2023 0801   ALKPHOS 100 10/23/2023 0801   BILITOT 0.7 10/23/2023 0801   BILIDIR 0.0 10/04/2012 0918      Component Value Date/Time   TSH 0.931 10/23/2023 0801   Nutritional Lab Results  Component Value Date   VD25OH 57.7 03/06/2023   VD25OH 74.7 03/20/2022   VD25OH 57.4 08/03/2021    Medications: Outpatient Encounter Medications as of 12/04/2023  Medication Sig   aspirin  81 MG EC tablet Take 81 mg by mouth daily.     Cholecalciferol (VITAMIN D ) 50 MCG (2000 UT) tablet Take 2,000 Units by mouth daily.   escitalopram  (LEXAPRO ) 10 MG tablet Take 1 tablet (10 mg total) by mouth at bedtime.   furosemide  (LASIX ) 40 MG tablet 1 tablet by mouth once daily and may take an extra tablet as needed for swelling   lisinopril  (ZESTRIL ) 10 MG tablet TAKE 1 TABLET BY MOUTH EVERYDAY AT BEDTIME   Omega-3 Fatty Acids (FISH OIL PO) Take 1 tablet by mouth daily.   prednisoLONE acetate (PRED FORTE) 1 % ophthalmic suspension PLEASE SEE ATTACHED FOR DETAILED DIRECTIONS   rosuvastatin  (CRESTOR ) 20 MG tablet Take 1 tablet (20 mg total) by mouth daily.   semaglutide -weight management (WEGOVY ) 2.4  MG/0.75ML SOAJ SQ injection Inject 2.4 mg into the skin once a week.   [DISCONTINUED] Semaglutide -Weight Management (WEGOVY ) 1.7 MG/0.75ML SOAJ Inject 1.7 mg into the skin once a week.   No facility-administered encounter medications on file as of 12/04/2023.     Follow-Up   Return in about 4 weeks (around 01/01/2024) for For Weight Mangement with Dr. Francyne.SABRA She was informed of the importance of frequent follow up visits to maximize her success with intensive lifestyle modifications for her multiple health conditions.  Attestation Statement   Reviewed by clinician on day of visit: allergies, medications, problem list, medical history, surgical history, family history, social history, and previous encounter notes.     Lucas Francyne, MD

## 2023-12-04 NOTE — Assessment & Plan Note (Signed)
 She has increased orexigenic signaling, impaired satiety and inhibitory control. This is secondary to an abnormal energy regulation system and pathological neurohormonal pathways characteristic of excess adiposity.  In addition to nutritional and behavioral strategies she benefits from ongoing pharmacotherapy.  She is experiencing wearing off effect to current dose has been at this dose since 2023.  We will increase Wegovy  to 2.4 mg once a week

## 2023-12-04 NOTE — Assessment & Plan Note (Signed)
 LDL cholesterol remains elevated at 129 mg/dL despite daily rosuvastatin  use. Family history suggests a potential genetic component to hypercholesterolemia. - Continue rosuvastatin . - Discuss with cardiologist the possibility of increasing rosuvastatin  dose or adding Zetia. - Consider coronary artery calcium  score to assess cardiovascular risk. - Advise dietary modifications to reduce saturated fat intake, focusing on lean meats and avoiding processed meats.

## 2023-12-04 NOTE — Assessment & Plan Note (Signed)
 She has grade 2 diastolic dysfunction with findings of HCM.  She is followed by cardiology.  She is not on beta-blockade.  No symptoms of dizziness or syncope.  She is currently on ACE inhibitor.  She had an echocardiogram done this year.  She is taking a loop diuretic daily this is slight decrease in GFR which may be prerenal.  She will work with cardiology to inquire about adjusting diuretic maybe 2 every other day.

## 2023-12-04 NOTE — Assessment & Plan Note (Signed)
 Total weight loss of 38 pounds with recent 6 pound weight gain due to a lapse. Obesity management with Wegovy  1.7 mg has been ongoing since August 2023. Reports increased hunger and decreased satiety, indicating a potential decrease in medication efficacy over time. - Increase Wegovy  to 2.4 mg weekly to improve satiety and support weight management. - Work on getting back on track

## 2023-12-04 NOTE — Assessment & Plan Note (Signed)
 She has had 2 GFR's under 60 she does not have diabetes but does have hypertension.  I think is likely related to the combination of loop diuretic and ACE inhibitor and less likely chronic kidney disease.  She will work with her cardiologist to see if they could reduce loop diuretic to every other day.  She also is encouraged to increase fluid intake avoid nephrotoxins.  Recommend repeating in 1 month including a urinalysis and possible renal ultrasound for further evaluation.

## 2024-01-16 ENCOUNTER — Encounter (INDEPENDENT_AMBULATORY_CARE_PROVIDER_SITE_OTHER): Payer: Self-pay | Admitting: Internal Medicine

## 2024-01-16 ENCOUNTER — Ambulatory Visit (INDEPENDENT_AMBULATORY_CARE_PROVIDER_SITE_OTHER): Admitting: Internal Medicine

## 2024-01-16 VITALS — BP 127/77 | HR 65 | Temp 98.0°F | Ht 65.0 in | Wt 166.0 lb

## 2024-01-16 DIAGNOSIS — E669 Obesity, unspecified: Secondary | ICD-10-CM | POA: Diagnosis not present

## 2024-01-16 DIAGNOSIS — I110 Hypertensive heart disease with heart failure: Secondary | ICD-10-CM

## 2024-01-16 DIAGNOSIS — I10 Essential (primary) hypertension: Secondary | ICD-10-CM

## 2024-01-16 DIAGNOSIS — I5032 Chronic diastolic (congestive) heart failure: Secondary | ICD-10-CM

## 2024-01-16 DIAGNOSIS — Z6827 Body mass index (BMI) 27.0-27.9, adult: Secondary | ICD-10-CM

## 2024-01-16 DIAGNOSIS — R7303 Prediabetes: Secondary | ICD-10-CM | POA: Diagnosis not present

## 2024-01-16 NOTE — Progress Notes (Signed)
 Office: [phone]  /  Fax: [phone]  Weight Summary and Body Composition Analysis (BIA)  Vitals Temp: 98 F (36.7 C) BP: 127/77 Pulse Rate: 65 SpO2: 100 %   Anthropometric Measurements Height: 5' 5 (1.651 m) Weight: 166 lb (75.3 kg) BMI (Calculated): 27.62 Weight at Last Visit: 164 lb Weight Lost Since Last Visit: 0 Weight Gained Since Last Visit: 2 lb Starting Weight: 202 lb Total Weight Loss (lbs): 36 lb (16.3 kg) Peak Weight: 220 lb   Body Composition  Body Fat %: 33.5 % Fat Mass (lbs): 55.8 lbs Muscle Mass (lbs): 105.2 lbs Total Body Water (lbs): 67 lbs Visceral Fat Rating : 8    No data recorded Today's Visit #: 28  Starting Date: 08/04/22   Subjective   Chief Complaint: Obesity  Interval History Discussed the use of AI scribe software for clinical note transcription with the patient, who gave verbal consent to proceed.  History of Present Illness [name] is a 63 year old female with prediabetes and diastolic heart failure who presents for medical weight management.  [name] has experienced a weight gain of two pounds and increased cravings for sugar. There is an increase in hunger, which [name] attributes to her busy work schedule as a Scientist, research (life sciences), leading to skipped meals. [name] feels worn out after work and occasionally skips dinner, resulting in waking up hungry at night.  [name] has been on Wegovy  since May or June 2023, with weight gain starting around February or March 2025.  [name] engages in regular physical activity, including walking and weight lifting, increasing her weights from ten to fifteen pounds. [name] exercises on the treadmill daily and does weight training on Saturdays. Despite her physical activity, [name] struggles with managing her nutrition, particularly with controlling cravings for sweets.  [name] reports sleeping well, attributing this to a medication that helps her sleep. No shakiness or sweating, except during  menopause or workouts. Her husband is diabetic and has a blood sugar testing kit, which [name] could use to monitor her blood sugar levels when experiencing cravings.  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 [name] is learning to manage stress better through meditation and listening to motivational speakers. [name] is also preparing for a cruise in November and is motivated to manage her weight before then.     Challenges affecting patient progress: metabolic adaptations associated with weight loss.    Pharmacotherapy for weight management: [name] is currently taking Wegovy  with adequate clinical response  and without side effects..   Assessment and Plan   Treatment Plan For Obesity:  Recommended Dietary Goals  [name] is currently in the action stage of change. As such, her goal is to continue weight management plan. [name] has agreed to: continue current plan  Behavioral Health and Counseling  We discussed the following behavioral modification strategies today: continue to work on maintaining a reduced calorie state, getting the recommended amount of protein, incorporating whole foods, making healthy choices, staying well hydrated and practicing mindfulness when eating. and increase protein intake, fibrous foods (25 grams per day for women, 30 grams for men) and water to improve satiety and decrease hunger signals. .  Additional education and resources provided today: None  Recommended Physical Activity Goals  [name] has been advised to work up to 150 minutes of moderate intensity aerobic activity a week and strengthening exercises 2-3 times per week for cardiovascular health, weight loss  maintenance and preservation of muscle mass.  [name] has agreed to :  Think about enjoyable ways to increase daily physical activity and overcoming barriers to exercise, Increase physical activity in their day and  reduce sedentary time (increase NEAT)., Increase volume of physical activity to a goal of 240 minutes a week, and Combine aerobic and strengthening exercises for efficiency and improved cardiometabolic health.  Medical Interventions and Pharmacotherapy  We discussed various medication options to help [name] with her weight loss efforts and we both agreed to : Adequate clinical response to anti-obesity medication, continue current regimen  Associated Conditions Impacted by Obesity Treatment  Assessment & Plan Essential hypertension Blood pressure is well-controlled.  Currently on lisinopril  without any adverse effects continue current regimen.  [name] will monitor for orthostasis and maintain adequate hydration since her blood pressure is low normal.  [name] benefits from ACE inhibitor due to history of hypertrophic cardiomyopathy.  [name] is not on beta-blockade.  Recent renal parameters show a decrease in GFR likely secondary to loop diuretic Chronic diastolic heart failure (HCC) [name] has grade 2 diastolic dysfunction with findings of HCM.  [name] is followed by cardiology.  [name] is not on beta-blockade.  No symptoms of dizziness or syncope.  [name] is currently on ACE inhibitor.  [name] had an echocardiogram done this year.  [name] is taking a loop diuretic daily this is slight decrease in GFR which may be prerenal.   Prediabetes Improved most recent A1c is 5.6.  [name] is currently on Wegovy  for pharmacoprophylaxis and will continue medication. Generalized obesity with starting BMI of 34 Weight: decrease of 36 lb (17.8%) over 2 years, 5 months and experiencing weight regain as of 3 months Start: 08/03/2021 202 lb (91.6 kg)  End: 01/16/2024 166 lb (75.3 kg)   Review of weight loss graph shows that [name] is entering a weight loss plateau likely due to metabolic adaptations [name] is also having increased cravings for sweets and decrease satiety.  [name] has been on Wegovy  for more than 2 years so is developing a degree of  tachyphylaxis as expected.  We we will adjust her caloric intake to 1000 to 1100 cal/day, 90 g of protein, and increase volume of physical activity.  Cravings for sweets may also be a manifestation of low blood sugar recommend [name] purchase a glucose meter over-the-counter and check her blood sugar during these episodes to make sure is not hypoglycemia.  If we exclude hypoglycemia then we may consider adding Wellbutrin or metformin as a next step.  I also recommend repeating her resting energy expenditure to see if [name] is having metabolic slowdown as a result of her weight loss. BMI 27.0-27.9,adult          Objective   Physical Exam:  Blood pressure 127/77, pulse 65, temperature 98 F (36.7 C), height 5' 5 (1.651 m), weight 166 lb (75.3 kg), SpO2 100%. Body mass index is 27.62 kg/m.  General: [name] is overweight, cooperative, alert, well developed, and in no acute distress. PSYCH: Has normal mood, affect and thought process.   HEENT: EOMI, sclerae are anicteric. Lungs: Normal breathing effort, no conversational dyspnea. Extremities: No edema.  Neurologic: No gross sensory or motor deficits. No tremors or fasciculations noted.    Diagnostic Data Reviewed:  BMET    Component Value Date/Time   NA 140 10/23/2023 0801   K 3.9 10/23/2023 0801   CL 102 10/23/2023 0801   CO2 22 10/23/2023 0801   GLUCOSE 83 10/23/2023 0801   GLUCOSE 105 (H)  10/30/2017 2320   BUN 17 10/23/2023 0801   CREATININE 1.08 (H) 10/23/2023 0801   CREATININE 1.01 01/22/2015 1033   CALCIUM  9.6 10/23/2023 0801   GFRNONAA 79 05/13/2019 1340   GFRAA 91 05/13/2019 1340   Lab Results  Component Value Date   HGBA1C 5.6 10/23/2023   HGBA1C 5.9 (H) 04/19/2014   Lab Results  Component Value Date   INSULIN  10.4 03/06/2023   INSULIN  22.8 08/03/2021   Lab Results  Component Value Date   TSH 0.931 10/23/2023   CBC    Component Value Date/Time   WBC 6.1 10/23/2023 0801   WBC 7.2 10/30/2017 2320   RBC 5.62  (H) 10/23/2023 0801   RBC 5.31 (H) 10/30/2017 2320   HGB 15.1 10/23/2023 0801   HCT 48.7 (H) 10/23/2023 0801   PLT 213 10/23/2023 0801   MCV 87 10/23/2023 0801   MCH 26.9 10/23/2023 0801   MCH 26.0 10/30/2017 2320   MCHC 31.0 (L) 10/23/2023 0801   MCHC 31.4 10/30/2017 2320   RDW 14.1 10/23/2023 0801   Iron Studies No results found for: IRON, TIBC, FERRITIN, IRONPCTSAT Lipid Panel     Component Value Date/Time   CHOL 193 10/23/2023 0801   TRIG 76 10/23/2023 0801   HDL 50 10/23/2023 0801   CHOLHDL 6.9 (H) 11/02/2020 1552   CHOLHDL 4.8 04/19/2014 0725   VLDL 19 04/19/2014 0725   LDLCALC 129 (H) 10/23/2023 0801   Hepatic Function Panel     Component Value Date/Time   PROT 7.6 10/23/2023 0801   ALBUMIN 4.7 10/23/2023 0801   AST 27 10/23/2023 0801   ALT 27 10/23/2023 0801   ALKPHOS 100 10/23/2023 0801   BILITOT 0.7 10/23/2023 0801   BILIDIR 0.0 10/04/2012 0918      Component Value Date/Time   TSH 0.931 10/23/2023 0801   Nutritional Lab Results  Component Value Date   VD25OH 57.7 03/06/2023   VD25OH 74.7 03/20/2022   VD25OH 57.4 08/03/2021    Medications: Outpatient Encounter Medications as of 01/16/2024  Medication Sig   aspirin  81 MG EC tablet Take 81 mg by mouth daily.     Cholecalciferol (VITAMIN D ) 50 MCG (2000 UT) tablet Take 2,000 Units by mouth daily.   escitalopram  (LEXAPRO ) 10 MG tablet Take 1 tablet (10 mg total) by mouth at bedtime.   furosemide  (LASIX ) 40 MG tablet 1 tablet by mouth once daily and may take an extra tablet as needed for swelling   lisinopril  (ZESTRIL ) 10 MG tablet TAKE 1 TABLET BY MOUTH EVERYDAY AT BEDTIME   Omega-3 Fatty Acids (FISH OIL PO) Take 1 tablet by mouth daily.   rosuvastatin  (CRESTOR ) 20 MG tablet Take 1 tablet (20 mg total) by mouth daily.   semaglutide -weight management (WEGOVY ) 2.4 MG/0.75ML SOAJ SQ injection Inject 2.4 mg into the skin once a week.   [DISCONTINUED] prednisoLONE acetate (PRED FORTE) 1 % ophthalmic  suspension PLEASE SEE ATTACHED FOR DETAILED DIRECTIONS (Patient not taking: Reported on 01/16/2024)   No facility-administered encounter medications on file as of 01/16/2024.     Follow-Up   Return in about 3 weeks (around 02/06/2024) for For Weight Mangement with Dr. Francyne, Fasting and 30 minutes early for IC.[name] [name] was informed of the importance of frequent follow up visits to maximize her success with intensive lifestyle modifications for her multiple health conditions.  Attestation Statement   Reviewed by clinician on day of visit: allergies, medications, problem list, medical history, surgical history, family history, social history, and previous encounter  notes.     Lucas Parker, MD

## 2024-01-16 NOTE — Assessment & Plan Note (Signed)
 Improved most recent A1c is 5.6.  She is currently on Wegovy  for pharmacoprophylaxis and will continue medication.

## 2024-01-16 NOTE — Assessment & Plan Note (Addendum)
 Weight: decrease of 36 lb (17.8%) over 2 years, 5 months and experiencing weight regain as of 3 months Start: 08/03/2021 202 lb (91.6 kg)  End: 01/16/2024 166 lb (75.3 kg)   Review of weight loss graph shows that she is entering a weight loss plateau likely due to metabolic adaptations she is also having increased cravings for sweets and decrease satiety.  She has been on Wegovy  for more than 2 years so is developing a degree of tachyphylaxis as expected.  We we will adjust her caloric intake to 1000 to 1100 cal/day, 90 g of protein, and increase volume of physical activity.  Cravings for sweets may also be a manifestation of low blood sugar recommend she purchase a glucose meter over-the-counter and check her blood sugar during these episodes to make sure is not hypoglycemia.  If we exclude hypoglycemia then we may consider adding Wellbutrin or metformin as a next step.  I also recommend repeating her resting energy expenditure to see if she is having metabolic slowdown as a result of her weight loss.

## 2024-01-16 NOTE — Assessment & Plan Note (Signed)
 Blood pressure is well-controlled.  Currently on lisinopril  without any adverse effects continue current regimen.  She will monitor for orthostasis and maintain adequate hydration since her blood pressure is low normal.  She benefits from ACE inhibitor due to history of hypertrophic cardiomyopathy.  She is not on beta-blockade.  Recent renal parameters show a decrease in GFR likely secondary to loop diuretic

## 2024-01-16 NOTE — Assessment & Plan Note (Signed)
 She has grade 2 diastolic dysfunction with findings of HCM.  She is followed by cardiology.  She is not on beta-blockade.  No symptoms of dizziness or syncope.  She is currently on ACE inhibitor.  She had an echocardiogram done this year.  She is taking a loop diuretic daily this is slight decrease in GFR which may be prerenal.

## 2024-01-23 ENCOUNTER — Other Ambulatory Visit (INDEPENDENT_AMBULATORY_CARE_PROVIDER_SITE_OTHER): Payer: Self-pay | Admitting: Internal Medicine

## 2024-01-23 DIAGNOSIS — E7800 Pure hypercholesterolemia, unspecified: Secondary | ICD-10-CM

## 2024-01-24 ENCOUNTER — Other Ambulatory Visit (INDEPENDENT_AMBULATORY_CARE_PROVIDER_SITE_OTHER): Payer: Self-pay | Admitting: Internal Medicine

## 2024-01-24 DIAGNOSIS — F418 Other specified anxiety disorders: Secondary | ICD-10-CM

## 2024-01-27 ENCOUNTER — Other Ambulatory Visit (INDEPENDENT_AMBULATORY_CARE_PROVIDER_SITE_OTHER): Payer: Self-pay | Admitting: Internal Medicine

## 2024-01-27 DIAGNOSIS — R7303 Prediabetes: Secondary | ICD-10-CM

## 2024-01-27 DIAGNOSIS — I5032 Chronic diastolic (congestive) heart failure: Secondary | ICD-10-CM

## 2024-01-27 DIAGNOSIS — E669 Obesity, unspecified: Secondary | ICD-10-CM

## 2024-01-27 DIAGNOSIS — R638 Other symptoms and signs concerning food and fluid intake: Secondary | ICD-10-CM

## 2024-01-27 DIAGNOSIS — I10 Essential (primary) hypertension: Secondary | ICD-10-CM

## 2024-02-18 ENCOUNTER — Ambulatory Visit (INDEPENDENT_AMBULATORY_CARE_PROVIDER_SITE_OTHER): Admitting: Internal Medicine

## 2024-02-18 ENCOUNTER — Encounter (INDEPENDENT_AMBULATORY_CARE_PROVIDER_SITE_OTHER): Payer: Self-pay | Admitting: Internal Medicine

## 2024-02-18 VITALS — BP 122/82 | HR 72 | Temp 98.7°F | Ht 65.0 in | Wt 167.0 lb

## 2024-02-18 DIAGNOSIS — Z6827 Body mass index (BMI) 27.0-27.9, adult: Secondary | ICD-10-CM

## 2024-02-18 DIAGNOSIS — E669 Obesity, unspecified: Secondary | ICD-10-CM

## 2024-02-18 DIAGNOSIS — I10 Essential (primary) hypertension: Secondary | ICD-10-CM | POA: Diagnosis not present

## 2024-02-18 DIAGNOSIS — R7303 Prediabetes: Secondary | ICD-10-CM

## 2024-02-18 MED ORDER — METFORMIN HCL ER 500 MG PO TB24: 500.0000 mg | ORAL_TABLET | Freq: Two times a day (BID) | ORAL | 0 refills | Status: DC

## 2024-02-18 NOTE — Assessment & Plan Note (Signed)
 Weight: decrease of 36 lb (17.8%) over 2 years, 5 months and experiencing weight regain as of 3 months Start: 08/03/2021 202 lb (91.6 kg)  End: 01/16/2024 166 lb (75.3 kg)  She has experienced a plateau in weight loss despite two years on Wegovy . Indirect calorimetry indicates a resting energy expenditure of 1642, suggesting metabolism is not the cause. She reports variable appetite control and decreased fullness compared to six months ago. Insurance may have stopped covering Wegovy  which patient was not aware necessitating a transition to alternative medications to avoid weight regain. - Start metformin 500 mg twice daily with breakfast and dinner. - Encourage high protein intake to suppress appetite, including protein shakes, bars, eggs, chicken, tuna, and salmon. - Advise against skipping meals and to stay hydrated. - Discuss with insurance about coverage for alternative medications such as Mounjaro for prediabetes. - Consider older anti-obesity medications if necessary. - Plan for a follow-up appointment in three weeks to assess progress. - Reviewed other ways to obtain GLP-1 treatment out-of-pocket

## 2024-02-18 NOTE — Assessment & Plan Note (Signed)
 Blood pressure is well-controlled.  Currently on lisinopril  without any adverse effects continue current regimen.   She benefits from ACE inhibitor due to history of hypertrophic cardiomyopathy.  She is not on beta-blockade.  Recent renal parameters show a decrease in GFR likely secondary to loop diuretic

## 2024-02-18 NOTE — Assessment & Plan Note (Signed)
 Improved most recent A1c is 5.6.  She is currently on Wegovy  for pharmacoprophylaxis and will continue medication.

## 2024-02-18 NOTE — Progress Notes (Signed)
 Office: [phone]  /  Fax: [phone]  Weight Summary and Body Composition Analysis (BIA)  Vitals Temp: 98.7 F (37.1 C) BP: 122/82 Pulse Rate: 72 SpO2: 100 %   Anthropometric Measurements Height: 5' 5 (1.651 m) Weight: 167 lb (75.8 kg) BMI (Calculated): 27.79 Weight at Last Visit: 166lb Weight Lost Since Last Visit: 0lb Weight Gained Since Last Visit: 1lb Starting Weight: 202lb Total Weight Loss (lbs): 35 lb (15.9 kg) Peak Weight: 220lb   Body Composition  Body Fat %: 33.8 % Fat Mass (lbs): 56.6 lbs Muscle Mass (lbs): 105.2 lbs Total Body Water (lbs): 67.8 lbs Visceral Fat Rating : 8    RMR: 1642  Today's Visit #: 29  Starting Date: 08/04/22   Subjective   Chief Complaint: Obesity  Interval History  History of Present Illness [name] is a 63 year old female with hypertension, prediabetes, and chronic diastolic heart failure who presents for medical weight management.  Last office visit we had reduced her caloric target to 1000 cal/day.  She is experiencing a plateau in her weight loss despite being on Wegovy  for two years. There is a noted variation in appetite control, with a decrease in the sensation of fullness compared to six months ago. She has been attempting to maintain a caloric intake of 1000 calories per day, but finds it challenging, particularly when dining out. The nature of restaurant meals and her busy schedule, which often leaves her exhausted, contribute to the difficulty in adhering to this caloric limit.  She underwent indirect calorimetry this morning, which showed a resting energy expenditure of 1642. Her insurance has recently stopped covering Wegovy , and she did not receive a refill this week.  She feels extremely tired after long workdays, often opting for a hot shower and sleep over exercise. A sleep aid she was given has been effective, allowing her to sleep well, sometimes leading to oversleeping in the mornings. She  checks her blood sugar when feeling hungry and reports it as normal.  Her current medication regimen includes Wegovy , which she has been on for two years, but she is now facing issues with insurance coverage for this medication. She recalls previously being on metformin before starting Wegovy .      Challenges affecting patient progress: metabolic adaptations associated with weight loss.    Pharmacotherapy for weight management: She is currently taking Wegovy  with adequate clinical response  and without side effects..  Patient is under the impression that she no longer has coverage  Assessment and Plan   Treatment Plan For Obesity:  Recommended Dietary Goals  [name] is currently in the action stage of change. As such, her goal is to continue weight management plan. She has agreed to: continue current plan  Behavioral Health and Counseling  We discussed the following behavioral modification strategies today: continue to work on maintaining a reduced calorie state, getting the recommended amount of protein, incorporating whole foods, making healthy choices, staying well hydrated and practicing mindfulness when eating. and increase protein intake, fibrous foods (25 grams per day for women, 30 grams for men) and water to improve satiety and decrease hunger signals. .  Additional education and resources provided today: None  Recommended Physical Activity Goals  [name] has been advised to work up to 150 minutes of moderate intensity aerobic activity a week and strengthening exercises 2-3 times per week for cardiovascular health, weight loss maintenance and preservation of muscle mass.  She has agreed to :  Think about enjoyable ways to increase  daily physical activity and overcoming barriers to exercise, Increase physical activity in their day and reduce sedentary time (increase NEAT)., Increase volume of physical activity to a goal of 240 minutes a week, and Combine aerobic and strengthening  exercises for efficiency and improved cardiometabolic health.  Medical Interventions and Pharmacotherapy  We discussed various medication options to help [name] with her weight loss efforts and we both agreed to : Adequate clinical response to anti-obesity medication, continue current regimen  Associated Conditions Impacted by Obesity Treatment  Assessment & Plan Prediabetes Improved most recent A1c is 5.6.  She is currently on Wegovy  for pharmacoprophylaxis and will continue medication. Generalized obesity BMI 27.0-27.9,adult Weight: decrease of 36 lb (17.8%) over 2 years, 5 months and experiencing weight regain as of 3 months Start: 08/03/2021 202 lb (91.6 kg)  End: 01/16/2024 166 lb (75.3 kg)  She has experienced a plateau in weight loss despite two years on Wegovy . Indirect calorimetry indicates a resting energy expenditure of 1642, suggesting metabolism is not the cause. She reports variable appetite control and decreased fullness compared to six months ago. Insurance may have stopped covering Wegovy  which patient was not aware necessitating a transition to alternative medications to avoid weight regain. - Start metformin 500 mg twice daily with breakfast and dinner. - Encourage high protein intake to suppress appetite, including protein shakes, bars, eggs, chicken, tuna, and salmon. - Advise against skipping meals and to stay hydrated. - Discuss with insurance about coverage for alternative medications such as Mounjaro for prediabetes. - Consider older anti-obesity medications if necessary. - Plan for a follow-up appointment in three weeks to assess progress. - Reviewed other ways to obtain GLP-1 treatment out-of-pocket  Essential hypertension Blood pressure is well-controlled.  Currently on lisinopril  without any adverse effects continue current regimen.   She benefits from ACE inhibitor due to history of hypertrophic cardiomyopathy.  She is not on beta-blockade.  Recent renal  parameters show a decrease in GFR likely secondary to loop diuretic             Objective   Physical Exam:  Blood pressure 122/82, pulse 72, temperature 98.7 F (37.1 C), height 5' 5 (1.651 m), weight 167 lb (75.8 kg), SpO2 100%. Body mass index is 27.79 kg/m.  General: She is overweight, cooperative, alert, well developed, and in no acute distress. PSYCH: Has normal mood, affect and thought process.   HEENT: EOMI, sclerae are anicteric. Lungs: Normal breathing effort, no conversational dyspnea. Extremities: No edema.  Neurologic: No gross sensory or motor deficits. No tremors or fasciculations noted.    Diagnostic Data Reviewed:  BMET    Component Value Date/Time   NA 140 10/23/2023 0801   K 3.9 10/23/2023 0801   CL 102 10/23/2023 0801   CO2 22 10/23/2023 0801   GLUCOSE 83 10/23/2023 0801   GLUCOSE 105 (H) 10/30/2017 2320   BUN 17 10/23/2023 0801   CREATININE 1.08 (H) 10/23/2023 0801   CREATININE 1.01 01/22/2015 1033   CALCIUM  9.6 10/23/2023 0801   GFRNONAA 79 05/13/2019 1340   GFRAA 91 05/13/2019 1340   Lab Results  Component Value Date   HGBA1C 5.6 10/23/2023   HGBA1C 5.9 (H) 04/19/2014   Lab Results  Component Value Date   INSULIN  10.4 03/06/2023   INSULIN  22.8 08/03/2021   Lab Results  Component Value Date   TSH 0.931 10/23/2023   CBC    Component Value Date/Time   WBC 6.1 10/23/2023 0801   WBC 7.2 10/30/2017 2320   RBC  5.62 (H) 10/23/2023 0801   RBC 5.31 (H) 10/30/2017 2320   HGB 15.1 10/23/2023 0801   HCT 48.7 (H) 10/23/2023 0801   PLT 213 10/23/2023 0801   MCV 87 10/23/2023 0801   MCH 26.9 10/23/2023 0801   MCH 26.0 10/30/2017 2320   MCHC 31.0 (L) 10/23/2023 0801   MCHC 31.4 10/30/2017 2320   RDW 14.1 10/23/2023 0801   Iron Studies No results found for: IRON, TIBC, FERRITIN, IRONPCTSAT Lipid Panel     Component Value Date/Time   CHOL 193 10/23/2023 0801   TRIG 76 10/23/2023 0801   HDL 50 10/23/2023 0801   CHOLHDL 6.9  (H) 11/02/2020 1552   CHOLHDL 4.8 04/19/2014 0725   VLDL 19 04/19/2014 0725   LDLCALC 129 (H) 10/23/2023 0801   Hepatic Function Panel     Component Value Date/Time   PROT 7.6 10/23/2023 0801   ALBUMIN 4.7 10/23/2023 0801   AST 27 10/23/2023 0801   ALT 27 10/23/2023 0801   ALKPHOS 100 10/23/2023 0801   BILITOT 0.7 10/23/2023 0801   BILIDIR 0.0 10/04/2012 0918      Component Value Date/Time   TSH 0.931 10/23/2023 0801   Nutritional Lab Results  Component Value Date   VD25OH 57.7 03/06/2023   VD25OH 74.7 03/20/2022   VD25OH 57.4 08/03/2021    Medications: Outpatient Encounter Medications as of 02/18/2024  Medication Sig   aspirin  81 MG EC tablet Take 81 mg by mouth daily.     Cholecalciferol (VITAMIN D ) 50 MCG (2000 UT) tablet Take 2,000 Units by mouth daily.   escitalopram  (LEXAPRO ) 10 MG tablet TAKE 1 TABLET BY MOUTH EVERYDAY AT BEDTIME   furosemide  (LASIX ) 40 MG tablet 1 tablet by mouth once daily and may take an extra tablet as needed for swelling   lisinopril  (ZESTRIL ) 10 MG tablet TAKE 1 TABLET BY MOUTH EVERYDAY AT BEDTIME   metFORMIN (GLUCOPHAGE-XR) 500 MG 24 hr tablet Take 1 tablet (500 mg total) by mouth 2 (two) times daily with a meal.   Omega-3 Fatty Acids (FISH OIL PO) Take 1 tablet by mouth daily.   rosuvastatin  (CRESTOR ) 20 MG tablet Take 1 tablet (20 mg total) by mouth daily.   semaglutide -weight management (WEGOVY ) 2.4 MG/0.75ML SOAJ SQ injection Inject 2.4 mg into the skin once a week.   No facility-administered encounter medications on file as of 02/18/2024.     Follow-Up   Return in about 3 weeks (around 03/10/2024) for For Weight Mangement with Dr. Francyne.SABRA She was informed of the importance of frequent follow up visits to maximize her success with intensive lifestyle modifications for her multiple health conditions.  Attestation Statement   Reviewed by clinician on day of visit: allergies, medications, problem list, medical history, surgical  history, family history, social history, and previous encounter notes.     Lucas Francyne, MD   Lucas Francyne, MD

## 2024-03-11 ENCOUNTER — Other Ambulatory Visit (INDEPENDENT_AMBULATORY_CARE_PROVIDER_SITE_OTHER): Payer: Self-pay | Admitting: Internal Medicine

## 2024-03-11 DIAGNOSIS — E669 Obesity, unspecified: Secondary | ICD-10-CM

## 2024-03-11 DIAGNOSIS — R7303 Prediabetes: Secondary | ICD-10-CM

## 2024-04-02 ENCOUNTER — Ambulatory Visit (INDEPENDENT_AMBULATORY_CARE_PROVIDER_SITE_OTHER): Admitting: Internal Medicine

## 2024-04-02 ENCOUNTER — Encounter (INDEPENDENT_AMBULATORY_CARE_PROVIDER_SITE_OTHER): Payer: Self-pay | Admitting: Internal Medicine

## 2024-04-02 VITALS — BP 146/84 | HR 66 | Temp 97.6°F | Ht 65.0 in | Wt 175.0 lb

## 2024-04-02 DIAGNOSIS — E7800 Pure hypercholesterolemia, unspecified: Secondary | ICD-10-CM | POA: Diagnosis not present

## 2024-04-02 DIAGNOSIS — I5032 Chronic diastolic (congestive) heart failure: Secondary | ICD-10-CM

## 2024-04-02 DIAGNOSIS — Z6829 Body mass index (BMI) 29.0-29.9, adult: Secondary | ICD-10-CM | POA: Diagnosis not present

## 2024-04-02 DIAGNOSIS — I10 Essential (primary) hypertension: Secondary | ICD-10-CM

## 2024-04-02 DIAGNOSIS — R7303 Prediabetes: Secondary | ICD-10-CM | POA: Diagnosis not present

## 2024-04-02 DIAGNOSIS — E669 Obesity, unspecified: Secondary | ICD-10-CM

## 2024-04-02 DIAGNOSIS — I110 Hypertensive heart disease with heart failure: Secondary | ICD-10-CM

## 2024-04-02 MED ORDER — TIRZEPATIDE 5 MG/0.5ML ~~LOC~~ SOAJ: 5.0000 mg | SUBCUTANEOUS | 0 refills | Status: AC

## 2024-04-02 NOTE — Assessment & Plan Note (Signed)
 Continue with calorie restricted low-carb high-protein meal plan targeting 1000 to 1200 cal/day Continue to increase volume of physical activity to a total of 240 minutes a week combined endurance and strengthening Start Mounjaro see above discussion. Follow-up in 4 weeks

## 2024-04-02 NOTE — Assessment & Plan Note (Signed)
°  Component Ref Range & Units (hover) 5 mo ago (10/23/23) 1 yr ago (03/06/23) 2 yr ago (03/20/22) 2 yr ago (08/03/21) 3 yr ago (11/02/20) 7 yr ago (02/13/17) 9 yr ago (04/19/14)  Hgb A1c MFr Bld 5.6 5.8 High  CM 5.9 High  CM 6.4 High  CM 5.9 Abnormal  R 5.5 R 5.9 High       As we can see by her table when she started treatment with Wegovy  she was as high as 6.4 close 2 diabetes and has been able to bring her A1c down to 5.6.  She is no longer on medication and is experiencing weight regain so anticipate that her glucose metabolism may worsen.  She will continue on metformin  along with nutrition and behavioral strategies.

## 2024-04-02 NOTE — Assessment & Plan Note (Signed)
 LDL cholesterol remains elevated at 129 mg/dL despite daily rosuvastatin  use. Family history suggests a potential genetic component to hypercholesterolemia. - Continue rosuvastatin . - Discuss with cardiologist the possibility of increasing rosuvastatin  dose or adding Zetia. - Consider coronary artery calcium  score to assess cardiovascular risk. - Advise dietary modifications to reduce saturated fat intake, focusing on lean meats and avoiding processed meats.

## 2024-04-02 NOTE — Assessment & Plan Note (Signed)
 She has grade 2 diastolic dysfunction with findings of HCM.  She is followed by cardiology.  She is not on beta-blockade.  No symptoms of dizziness or syncope.  She is currently on ACE inhibitor.  She had an echocardiogram done this year.  She is taking a loop diuretic daily this is slight decrease in GFR which may be prerenal.  Her blood pressure today is elevated.  I recommend monitoring blood pressure at home to look at trends.  Her blood pressure is likely increasing due to the absence of semaglutide .

## 2024-04-02 NOTE — Progress Notes (Signed)
 Office: [phone]  /  Fax: [phone]  Weight Summary and Body Composition Analysis (BIA)  Vitals Temp: 97.6 F (36.4 C) BP: (!) 146/84 Pulse Rate: 66 SpO2: 99 %   Anthropometric Measurements Height: 5' 5 (1.651 m) Weight: 175 lb (79.4 kg) BMI (Calculated): 29.12 Weight at Last Visit: 167 lb Weight Lost Since Last Visit: 0 lb Weight Gained Since Last Visit: 8 lb Starting Weight: 202lb Total Weight Loss (lbs): 27 lb (12.2 kg) Peak Weight: 220 lb   Body Composition  Body Fat %: 34.6 % Fat Mass (lbs): 60.8 lbs Muscle Mass (lbs): 109 lbs Total Body Water (lbs): 71 lbs Visceral Fat Rating : 9    RMR: 1642  Today's Visit #: 30  Starting Date: 08/04/22   Subjective   Chief Complaint: Obesity  Interval History Discussed the use of AI scribe software for clinical note transcription with the patient, who gave verbal consent to proceed.  History of Present Illness [name] is a 63 year old female with hypertension, chronic diastolic heart failure, and prediabetes who presents for medical weight management.  She has experienced an eight-pound weight gain since her last visit, coinciding with the discontinuation of Wegovy  two months ago due to insurance coverage issues. She had been on Wegovy  for approximately two years and notes weight regain since stopping the medication. Her BMI has increased from 27 to 29.  She describes her appetite as abnormal and experiences 'emotional hunger.' She attempted acupuncture for weight loss during a recent cruise, which involved puncturing in her feet and a light treatment on her liver. Additionally, she used a 'knockout pill' to aid sleep during the cruise.  Her husband has been critically ill and on life support in the ICU since last week, significantly impacting her sleep and stress levels. This situation has contributed to her challenges in managing her weight, and she acknowledges eating poorly, such as consuming  peanuts from vending machines at the hospital.  She is currently taking metformin , which is covered by her insurance. She has previously used Victoza, a daily injection, and recalls it being affordable with a discount at [facility].     Challenges affecting patient progress: low volume of physical activity at present , moderate to high levels of stress, recent lapse in weight management plan due to work, travel, illness or family circumstances, having difficulties with GLP-1 or AOM coverage, and coming off Wegovy  due to lack of coverage.    Pharmacotherapy for weight management: She is currently taking Metformin  (off label use for weight management and / or insulin  resistance and / or diabetes prevention) with adequate clinical response  and without side effects..   Assessment and Plan   Treatment Plan For Obesity:  Recommended Dietary Goals  Maegan is currently in the action stage of change. As such, her goal is to continue weight management plan. She has agreed to: continue to work on research officer, trade union of reduced calorie nutrition plan (RCNP)  [employer] and Counseling  We discussed the following behavioral modification strategies today: work on managing stress, creating time for self-care and relaxation, continue to work on maintaining a reduced calorie state, getting the recommended amount of protein, incorporating whole foods, making healthy choices, staying well hydrated and practicing mindfulness when eating., increase protein intake, fibrous foods (25 grams per day for women, 30 grams for men) and water to improve satiety and decrease hunger signals. , and getting back on track after recent relapse.  Additional education and resources provided today: None  Recommended Physical Activity Goals  Royale has been advised to work up to 150 minutes of moderate intensity aerobic activity a week and strengthening exercises 2-3 times per week for cardiovascular health, weight loss maintenance  and preservation of muscle mass.  She has agreed to :  Think about enjoyable ways to increase daily physical activity and overcoming barriers to exercise and Increase physical activity in their day and reduce sedentary time (increase NEAT).  Medical Interventions and Pharmacotherapy  We discussed various medication options to help Leeta with her weight loss efforts and we both agreed to : We discussed various medication options to help Taylen with her weight loss efforts and we both agreed to : start anti-obesity medication.   In addition to reduced calorie nutrition plan (RCNP), behavioral strategies and physical activity, Pami would benefit from pharmacotherapy to assist with hunger signals, satiety and cravings. This will reduce obesity-related health risks by inducing weight loss, and help reduce food consumption and adherence to [facility]). It may also improve QOL by improving self-confidence and reduce the setbacks associated with metabolic adaptations.  She also has high risk comorbidities associated with her obesity including: Hypertension, diastolic heart failure, prediabetes, hyperlipidemia.  All of these conditions increase her risk for future complications and are either improved or potentially reverse through sustained weight loss.  GLP-1 receptor agonists have been shown in robust clinical trials to: Enhance satiety and delay gastric emptying, resulting in reduced caloric intake Improve insulin  sensitivity and glycemic control Promote clinically meaningful weight loss (>=5-22%) Reduce cardiovascular risk markers, including blood pressure, lipids, and inflammation  Patient had previously been on Wegovy  for 2 years and had lost 53 pounds of 25.6% of total body weight loss.  Unfortunately medication is no longer covered and as expected she is experiencing weight regain of 21 pounds.  Given Hazle's clinical profile--GLP-1 therapy is both indicated and expected to provide multifactorial  benefit. The medication's mechanism aligns precisely with the patient's needs and addresses the physiologic underpinnings of weight gain.   After a detailed discussion covering treatment rationale, mechanism of action, expected outcomes, risks, and long-term use considerations, shared decision-making was used to initiate Mounjaro 5 mg once a week the importance of ongoing lifestyle management and long-term adherence was emphasized, as was the potential for weight regain following discontinuation.  The delivery device was demonstrated, and using the teach-back method, Trenyce successfully demonstrated proper injection technique. Ongoing monitoring and follow-up are planned to assess tolerability, clinical response, and reinforce behavioral strategies.   Associated Conditions Impacted by Obesity Treatment  Assessment & Plan Generalized obesity -with a starting BMI of 34 BMI 29.0-29.9,adult Continue with calorie restricted low-carb high-protein meal plan targeting 1000 to 1200 cal/day Continue to increase volume of physical activity to a total of 240 minutes a week combined endurance and strengthening Start Mounjaro see above discussion. Follow-up in 4 weeks Prediabetes          Component Ref Range & Units (hover) 5 mo ago (10/23/23) 1 yr ago (03/06/23) 2 yr ago (03/20/22) 2 yr ago (08/03/21) 3 yr ago (11/02/20) 7 yr ago (02/13/17) 9 yr ago (04/19/14)  Hgb A1c MFr Bld 5.6 5.8 High  CM 5.9 High  CM 6.4 High  CM 5.9 Abnormal  R 5.5 R 5.9 High       As we can see by her table when she started treatment with Wegovy  she was as high as 6.4 close 2 diabetes and has been able to bring her A1c down to 5.6.  She  is no longer on medication and is experiencing weight regain so anticipate that her glucose metabolism may worsen.  She will continue on metformin  along with nutrition and behavioral strategies. Essential hypertension Blood pressure is elevated today and not at goal likely secondary to stress.   She denies problems with adherence with medications.  Her blood pressure was also previously well-controlled on Wegovy , she has lost the benefits of antihypertensive properties of semaglutide .  Her medications will likely need to be adjusted.  Recommend monitoring at home if blood pressures remain elevated then we will need to intensify medical treatment. Chronic diastolic heart failure (HCC) She has grade 2 diastolic dysfunction with findings of HCM.  She is followed by cardiology.  She is not on beta-blockade.  No symptoms of dizziness or syncope.  She is currently on ACE inhibitor.  She had an echocardiogram done this year.  She is taking a loop diuretic daily this is slight decrease in GFR which may be prerenal.  Her blood pressure today is elevated.  I recommend monitoring blood pressure at home to look at trends.  Her blood pressure is likely increasing due to the absence of semaglutide . Pure hypercholesterolemia LDL cholesterol remains elevated at 129 mg/dL despite daily rosuvastatin  use. Family history suggests a potential genetic component to hypercholesterolemia. - Continue rosuvastatin . - Discuss with cardiologist the possibility of increasing rosuvastatin  dose or adding Zetia. - Consider coronary artery calcium  score to assess cardiovascular risk. - Advise dietary modifications to reduce saturated fat intake, focusing on lean meats and avoiding processed meats.         Objective   Physical Exam:  Blood pressure (!) 146/84, pulse 66, temperature 97.6 F (36.4 C), height 5' 5 (1.651 m), weight 175 lb (79.4 kg), SpO2 99%. Body mass index is 29.12 kg/m.  General: She is overweight, cooperative, alert, well developed, and in no acute distress. PSYCH: Has normal mood, affect and thought process.   HEENT: EOMI, sclerae are anicteric. Lungs: Normal breathing effort, no conversational dyspnea. Extremities: No edema.  Neurologic: No gross sensory or motor deficits. No tremors or  fasciculations noted.    Diagnostic Data Reviewed:  BMET    Component Value Date/Time   NA 140 10/23/2023 0801   K 3.9 10/23/2023 0801   CL 102 10/23/2023 0801   CO2 22 10/23/2023 0801   GLUCOSE 83 10/23/2023 0801   GLUCOSE 105 (H) 10/30/2017 2320   BUN 17 10/23/2023 0801   CREATININE 1.08 (H) 10/23/2023 0801   CREATININE 1.01 01/22/2015 1033   CALCIUM  9.6 10/23/2023 0801   GFRNONAA 79 05/13/2019 1340   GFRAA 91 05/13/2019 1340   Lab Results  Component Value Date   HGBA1C 5.6 10/23/2023   HGBA1C 5.9 (H) 04/19/2014   Lab Results  Component Value Date   INSULIN  10.4 03/06/2023   INSULIN  22.8 08/03/2021   Lab Results  Component Value Date   TSH 0.931 10/23/2023   CBC    Component Value Date/Time   WBC 6.1 10/23/2023 0801   WBC 7.2 10/30/2017 2320   RBC 5.62 (H) 10/23/2023 0801   RBC 5.31 (H) 10/30/2017 2320   HGB 15.1 10/23/2023 0801   HCT 48.7 (H) 10/23/2023 0801   PLT 213 10/23/2023 0801   MCV 87 10/23/2023 0801   MCH 26.9 10/23/2023 0801   MCH 26.0 10/30/2017 2320   MCHC 31.0 (L) 10/23/2023 0801   MCHC 31.4 10/30/2017 2320   RDW 14.1 10/23/2023 0801   Iron Studies No results found for: IRON, TIBC,  FERRITIN, IRONPCTSAT Lipid Panel     Component Value Date/Time   CHOL 193 10/23/2023 0801   TRIG 76 10/23/2023 0801   HDL 50 10/23/2023 0801   CHOLHDL 6.9 (H) 11/02/2020 1552   CHOLHDL 4.8 04/19/2014 0725   VLDL 19 04/19/2014 0725   LDLCALC 129 (H) 10/23/2023 0801   Hepatic Function Panel     Component Value Date/Time   PROT 7.6 10/23/2023 0801   ALBUMIN 4.7 10/23/2023 0801   AST 27 10/23/2023 0801   ALT 27 10/23/2023 0801   ALKPHOS 100 10/23/2023 0801   BILITOT 0.7 10/23/2023 0801   BILIDIR 0.0 10/04/2012 0918      Component Value Date/Time   TSH 0.931 10/23/2023 0801   Nutritional Lab Results  Component Value Date   VD25OH 57.7 03/06/2023   VD25OH 74.7 03/20/2022   VD25OH 57.4 08/03/2021    Medications: Outpatient Encounter  Medications as of 04/02/2024  Medication Sig   tirzepatide (MOUNJARO) 5 MG/0.5ML Pen Inject 5 mg into the skin once a week.   aspirin  81 MG EC tablet Take 81 mg by mouth daily.     Cholecalciferol (VITAMIN D ) 50 MCG (2000 UT) tablet Take 2,000 Units by mouth daily.   escitalopram  (LEXAPRO ) 10 MG tablet TAKE 1 TABLET BY MOUTH EVERYDAY AT BEDTIME   furosemide  (LASIX ) 40 MG tablet 1 tablet by mouth once daily and may take an extra tablet as needed for swelling   lisinopril  (ZESTRIL ) 10 MG tablet TAKE 1 TABLET BY MOUTH EVERYDAY AT BEDTIME   metFORMIN  (GLUCOPHAGE -XR) 500 MG 24 hr tablet TAKE 1 TABLET BY MOUTH 2 TIMES DAILY WITH A MEAL.   Omega-3 Fatty Acids (FISH OIL PO) Take 1 tablet by mouth daily.   rosuvastatin  (CRESTOR ) 20 MG tablet Take 1 tablet (20 mg total) by mouth daily.   [DISCONTINUED] semaglutide -weight management (WEGOVY ) 2.4 MG/0.75ML SOAJ SQ injection Inject 2.4 mg into the skin once a week.   No facility-administered encounter medications on file as of 04/02/2024.     Follow-Up   Return in about 4 weeks (around 04/30/2024) for For Weight Mangement with Dr. Francyne.SABRA She was informed of the importance of frequent follow up visits to maximize her success with intensive lifestyle modifications for her multiple health conditions.  Attestation Statement   Reviewed by clinician on day of visit: allergies, medications, problem list, medical history, surgical history, family history, social history, and previous encounter notes.     Lucas Francyne, MD

## 2024-04-02 NOTE — Assessment & Plan Note (Signed)
 Blood pressure is elevated today and not at goal likely secondary to stress.  She denies problems with adherence with medications.  Her blood pressure was also previously well-controlled on Wegovy , she has lost the benefits of antihypertensive properties of semaglutide .  Her medications will likely need to be adjusted.  Recommend monitoring at home if blood pressures remain elevated then we will need to intensify medical treatment.

## 2024-05-02 ENCOUNTER — Other Ambulatory Visit (INDEPENDENT_AMBULATORY_CARE_PROVIDER_SITE_OTHER): Payer: Self-pay | Admitting: Internal Medicine

## 2024-05-02 DIAGNOSIS — F418 Other specified anxiety disorders: Secondary | ICD-10-CM

## 2024-05-14 ENCOUNTER — Encounter (INDEPENDENT_AMBULATORY_CARE_PROVIDER_SITE_OTHER): Payer: Self-pay | Admitting: Internal Medicine

## 2024-05-14 ENCOUNTER — Ambulatory Visit (INDEPENDENT_AMBULATORY_CARE_PROVIDER_SITE_OTHER): Admitting: Internal Medicine

## 2024-05-14 DIAGNOSIS — I10 Essential (primary) hypertension: Secondary | ICD-10-CM

## 2024-05-14 DIAGNOSIS — I5032 Chronic diastolic (congestive) heart failure: Secondary | ICD-10-CM

## 2024-05-14 DIAGNOSIS — R7303 Prediabetes: Secondary | ICD-10-CM

## 2024-05-14 DIAGNOSIS — E669 Obesity, unspecified: Secondary | ICD-10-CM

## 2024-05-14 DIAGNOSIS — I110 Hypertensive heart disease with heart failure: Secondary | ICD-10-CM

## 2024-05-14 DIAGNOSIS — Z6834 Body mass index (BMI) 34.0-34.9, adult: Secondary | ICD-10-CM | POA: Diagnosis not present

## 2024-05-14 DIAGNOSIS — E7800 Pure hypercholesterolemia, unspecified: Secondary | ICD-10-CM

## 2024-05-14 NOTE — Progress Notes (Signed)
 "  Office: [phone]  /  Fax: [phone]  Weight Summary and Body Composition Analysis (BIA)  Vitals Temp: 98 F (36.7 C) BP: 138/81 Pulse Rate: 62 SpO2: 98 %   Anthropometric Measurements Height: 5' 5 (1.651 m) Weight: 164 lb (74.4 kg) BMI (Calculated): 27.29 Weight at Last Visit: 175 lb Weight Lost Since Last Visit: 11 lb Weight Gained Since Last Visit: 0 lb Starting Weight: 202 lb Total Weight Loss (lbs): 38 lb (17.2 kg) Peak Weight: 220 lb   Body Composition  Body Fat %: 31.4 % Fat Mass (lbs): 51.6 lbs Muscle Mass (lbs): 106.8 lbs Total Body Water (lbs): 65.6 lbs Visceral Fat Rating : 8    RMR: 1642  Today's Visit #: 31  Starting Date: 08/04/22   Subjective   Chief Complaint: Obesity  Interval History  Discussed the use of AI scribe software for clinical note transcription with the patient, who gave verbal consent to proceed.  History of Present Illness [name] is a 64 year old female with hypertension, chronic diastolic heart failure, and prediabetes who presents for medical weight management.  She was previously on Wegovy , which was discontinued due to insurance coverage issues. She is currently on Mounjaro  5 mg and has lost 11 pounds since the last office visit. She follows a 1000 calorie nutrition target about 20% of the time and exercises three days a week for 20 minutes. She identifies taking care of her husband as a barrier to her nutritional and physical activity goals.  She reports a significant reduction in hunger and 'food noise' since starting Mounjaro , which she feels is adequate for her weight management goals. She aims to reach a weight of 155 pounds. She is participating in a church fasting program, consuming only fruit and water from 12 to 6 PM until the end of the month. No nausea, vomiting, or constipation.  Her current medications include Mounjaro  and escitalopram  (Lexapro ) for anxiety, which helps her significantly. She  has a blood pressure machine at home.  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 She is also caring for her husband, who is currently in rehab and expected to be discharged at the end of the month.     Challenges affecting patient progress: Caregiving responsibilities long work hours.    Pharmacotherapy for weight management: She is currently taking Monjauro with prediabetes / insulin  resistance as the primary indication with adequate clinical response  and without side effects.. Patient did not respond or had adverse reaction to metformin  in the past..   Assessment and Plan   Treatment Plan For Obesity:  Recommended Dietary Goals  Cate is currently in the action stage of change. As such, her goal is to continue weight management plan. She has agreed to: follow a balanced (30%/40%/30%), whole foods-based, reduced-calorie meal plan (RCNP) targeting 1000 kcal per day  Behavioral Health and Counseling  We discussed the following behavioral modification strategies today: increasing lean protein intake to established goals and avoiding skipping meals.  Additional education and resources provided today: None  Recommended Physical Activity Goals  Larry has been advised to work up to 150 minutes of moderate intensity aerobic activity a week and strengthening exercises 2-3 times per week for cardiovascular health, weight loss maintenance and preservation of muscle mass.  She has agreed to :  Think about enjoyable ways to increase daily physical activity and overcoming barriers to exercise, Increase physical activity in their  day and reduce sedentary time (increase NEAT)., Increase volume of physical activity to a goal of 240 minutes a week, and Combine aerobic and strengthening exercises for efficiency and improved cardiometabolic health.  Medical Interventions and Pharmacotherapy  We discussed various  medication options to help Maili with her weight loss efforts and we both agreed to : Adequate clinical response to anti-obesity medication, continue current anti-obesity regimen  Associated Conditions Impacted by Obesity Treatment  Assessment & Plan Generalized obesity -with a starting BMI of 34 Currently on Mounjaro  5 mg with an 11-pound weight loss since last visit. Following a 1000 calorie nutrition target about 20% of the time and exercising three days a week for 20 minutes. Reports adequate hunger control and no adverse effects such as nausea, vomiting, or constipation. Discussed potential muscle loss with rapid weight loss and importance of protein intake to preserve lean muscle mass. Current dose of Mounjaro  is effective, and there is no need to increase the dose. Discussed cost of medication if insurance coverage is lost, which is approximately $349 per month. - Continue Mounjaro  5 mg. - Discontinued metformin . - Ensure protein intake of 30 grams per meal. - Incorporate strengthening exercises at least once or twice a week. - Scheduled follow-up in one month to assess muscle mass and body composition. Prediabetes Discontinue metformin  continue Mounjaro  for pharmacoprevention.  She will be due for disease monitoring labs in July Essential hypertension Blood pressure recorded at 138/81 mmHg. Anticipated improvement with weight loss and Mounjaro  use. Advised to monitor blood pressure at home, especially if experiencing dizziness or lightheadedness.  Continue lisinopril  - Monitor blood pressure at home. - Scheduled follow-up in one month to assess blood pressure changes. Chronic diastolic heart failure (HCC) She has grade 2 diastolic dysfunction with findings of HCM.  She is followed by cardiology.  She is not on beta-blockade.  No symptoms of dizziness or syncope.  She is currently on ACE inhibitor.  She had an echocardiogram done this year.  She is taking a loop diuretic daily this is  slight decrease in GFR which may be prerenal.  Her blood pressure today is elevated.  I recommend monitoring blood pressure at home to look at trends.  Her blood pressure is likely increasing due to the absence of semaglutide . Pure hypercholesterolemia LDL cholesterol remains elevated at 129 mg/dL despite daily rosuvastatin  use. Family history suggests a potential genetic component to hypercholesterolemia. - Continue rosuvastatin . - Discuss with cardiologist the possibility of increasing rosuvastatin  dose or adding Zetia. - Consider coronary artery calcium  score to assess cardiovascular risk. - Advise dietary modifications to reduce saturated fat intake, focusing on lean meats and avoiding processed meats.         Objective   Physical Exam:  Blood pressure 138/81, pulse 62, temperature 98 F (36.7 C), height 5' 5 (1.651 m), weight 164 lb (74.4 kg), SpO2 98%. Body mass index is 27.29 kg/m.  General: She is overweight, cooperative, alert, well developed, and in no acute distress. PSYCH: Has normal mood, affect and thought process.   HEENT: EOMI, sclerae are anicteric. Lungs: Normal breathing effort, no conversational dyspnea. Extremities: No edema.  Neurologic: No gross sensory or motor deficits. No tremors or fasciculations noted.    Diagnostic Data Reviewed:  BMET    Component Value Date/Time   NA 140 10/23/2023 0801   K 3.9 10/23/2023 0801   CL 102 10/23/2023 0801   CO2 22 10/23/2023 0801   GLUCOSE 83 10/23/2023 0801   GLUCOSE 105 (H) 10/30/2017 2320  BUN 17 10/23/2023 0801   CREATININE 1.08 (H) 10/23/2023 0801   CREATININE 1.01 01/22/2015 1033   CALCIUM  9.6 10/23/2023 0801   GFRNONAA 79 05/13/2019 1340   GFRAA 91 05/13/2019 1340   Lab Results  Component Value Date   HGBA1C 5.6 10/23/2023   HGBA1C 5.9 (H) 04/19/2014   Lab Results  Component Value Date   INSULIN  10.4 03/06/2023   INSULIN  22.8 08/03/2021   Lab Results  Component Value Date   TSH 0.931  10/23/2023   CBC    Component Value Date/Time   WBC 6.1 10/23/2023 0801   WBC 7.2 10/30/2017 2320   RBC 5.62 (H) 10/23/2023 0801   RBC 5.31 (H) 10/30/2017 2320   HGB 15.1 10/23/2023 0801   HCT 48.7 (H) 10/23/2023 0801   PLT 213 10/23/2023 0801   MCV 87 10/23/2023 0801   MCH 26.9 10/23/2023 0801   MCH 26.0 10/30/2017 2320   MCHC 31.0 (L) 10/23/2023 0801   MCHC 31.4 10/30/2017 2320   RDW 14.1 10/23/2023 0801   Iron Studies No results found for: IRON, TIBC, FERRITIN, IRONPCTSAT Lipid Panel     Component Value Date/Time   CHOL 193 10/23/2023 0801   TRIG 76 10/23/2023 0801   HDL 50 10/23/2023 0801   CHOLHDL 6.9 (H) 11/02/2020 1552   CHOLHDL 4.8 04/19/2014 0725   VLDL 19 04/19/2014 0725   LDLCALC 129 (H) 10/23/2023 0801   Hepatic Function Panel     Component Value Date/Time   PROT 7.6 10/23/2023 0801   ALBUMIN 4.7 10/23/2023 0801   AST 27 10/23/2023 0801   ALT 27 10/23/2023 0801   ALKPHOS 100 10/23/2023 0801   BILITOT 0.7 10/23/2023 0801   BILIDIR 0.0 10/04/2012 0918      Component Value Date/Time   TSH 0.931 10/23/2023 0801   Nutritional Lab Results  Component Value Date   VD25OH 57.7 03/06/2023   VD25OH 74.7 03/20/2022   VD25OH 57.4 08/03/2021    Medications: Outpatient Encounter Medications as of 05/14/2024  Medication Sig   aspirin  81 MG EC tablet Take 81 mg by mouth daily.     Cholecalciferol (VITAMIN D ) 50 MCG (2000 UT) tablet Take 2,000 Units by mouth daily.   escitalopram  (LEXAPRO ) 10 MG tablet TAKE 1 TABLET BY MOUTH EVERYDAY AT BEDTIME   furosemide  (LASIX ) 40 MG tablet 1 tablet by mouth once daily and may take an extra tablet as needed for swelling   lisinopril  (ZESTRIL ) 10 MG tablet TAKE 1 TABLET BY MOUTH EVERYDAY AT BEDTIME   Omega-3 Fatty Acids (FISH OIL PO) Take 1 tablet by mouth daily.   rosuvastatin  (CRESTOR ) 20 MG tablet Take 1 tablet (20 mg total) by mouth daily.   tirzepatide  (MOUNJARO ) 5 MG/0.5ML Pen Inject 5 mg into the skin once a  week.   [DISCONTINUED] metFORMIN  (GLUCOPHAGE -XR) 500 MG 24 hr tablet TAKE 1 TABLET BY MOUTH 2 TIMES DAILY WITH A MEAL.   No facility-administered encounter medications on file as of 05/14/2024.     Follow-Up   Return in about 4 weeks (around 06/11/2024) for For Weight Mangement with Dr. Francyne.SABRA She was informed of the importance of frequent follow up visits to maximize her success with intensive lifestyle modifications for her multiple health conditions.  Attestation Statement   Reviewed by clinician on day of visit: allergies, medications, problem list, medical history, surgical history, family history, social history, and previous encounter notes.     Lucas Francyne, MD  "

## 2024-05-14 NOTE — Assessment & Plan Note (Signed)
 LDL cholesterol remains elevated at 129 mg/dL despite daily rosuvastatin  use. Family history suggests a potential genetic component to hypercholesterolemia. - Continue rosuvastatin . - Discuss with cardiologist the possibility of increasing rosuvastatin  dose or adding Zetia. - Consider coronary artery calcium  score to assess cardiovascular risk. - Advise dietary modifications to reduce saturated fat intake, focusing on lean meats and avoiding processed meats.

## 2024-05-14 NOTE — Assessment & Plan Note (Signed)
 She has grade 2 diastolic dysfunction with findings of HCM.  She is followed by cardiology.  She is not on beta-blockade.  No symptoms of dizziness or syncope.  She is currently on ACE inhibitor.  She had an echocardiogram done this year.  She is taking a loop diuretic daily this is slight decrease in GFR which may be prerenal.  Her blood pressure today is elevated.  I recommend monitoring blood pressure at home to look at trends.  Her blood pressure is likely increasing due to the absence of semaglutide .

## 2024-05-14 NOTE — Assessment & Plan Note (Signed)
 Discontinue metformin  continue Mounjaro  for pharmacoprevention.  She will be due for disease monitoring labs in July

## 2024-05-14 NOTE — Assessment & Plan Note (Signed)
 Currently on Mounjaro  5 mg with an 11-pound weight loss since last visit. Following a 1000 calorie nutrition target about 20% of the time and exercising three days a week for 20 minutes. Reports adequate hunger control and no adverse effects such as nausea, vomiting, or constipation. Discussed potential muscle loss with rapid weight loss and importance of protein intake to preserve lean muscle mass. Current dose of Mounjaro  is effective, and there is no need to increase the dose. Discussed cost of medication if insurance coverage is lost, which is approximately $349 per month. - Continue Mounjaro  5 mg. - Discontinued metformin . - Ensure protein intake of 30 grams per meal. - Incorporate strengthening exercises at least once or twice a week. - Scheduled follow-up in one month to assess muscle mass and body composition.

## 2024-05-14 NOTE — Assessment & Plan Note (Signed)
 Blood pressure recorded at 138/81 mmHg. Anticipated improvement with weight loss and Mounjaro  use. Advised to monitor blood pressure at home, especially if experiencing dizziness or lightheadedness.  Continue lisinopril  - Monitor blood pressure at home. - Scheduled follow-up in one month to assess blood pressure changes.

## 2024-06-26 ENCOUNTER — Ambulatory Visit (INDEPENDENT_AMBULATORY_CARE_PROVIDER_SITE_OTHER): Admitting: Internal Medicine

## 2024-07-24 ENCOUNTER — Ambulatory Visit (INDEPENDENT_AMBULATORY_CARE_PROVIDER_SITE_OTHER): Admitting: Internal Medicine
